# Patient Record
Sex: Male | Born: 1975 | Race: Black or African American | Hispanic: No | Marital: Single | Smoking: Never smoker
Health system: Southern US, Community
[De-identification: ages and names within clinical notes are randomized; demographics above are authoritative.]

## PROBLEM LIST (undated history)

## (undated) DIAGNOSIS — R569 Unspecified convulsions: Secondary | ICD-10-CM

## (undated) DIAGNOSIS — S069XAA Unspecified intracranial injury with loss of consciousness status unknown, initial encounter: Secondary | ICD-10-CM

## (undated) DIAGNOSIS — S069X9A Unspecified intracranial injury with loss of consciousness of unspecified duration, initial encounter: Secondary | ICD-10-CM

## (undated) HISTORY — PX: CRANIOTOMY: SHX93

## (undated) HISTORY — PX: CRANIOPLASTY: SHX1407

---

## 2017-08-04 ENCOUNTER — Encounter (HOSPITAL_COMMUNITY): Payer: Self-pay | Admitting: Emergency Medicine

## 2017-08-04 ENCOUNTER — Emergency Department (HOSPITAL_COMMUNITY)
Admission: EM | Admit: 2017-08-04 | Discharge: 2017-08-05 | Disposition: A | Discharge status: Skilled Nursing Facility | Payer: Medicaid Other | Attending: Emergency Medicine | Admitting: Emergency Medicine

## 2017-08-04 DIAGNOSIS — Z79899 Other long term (current) drug therapy: Secondary | ICD-10-CM | POA: Insufficient documentation

## 2017-08-04 DIAGNOSIS — R21 Rash and other nonspecific skin eruption: Secondary | ICD-10-CM | POA: Diagnosis not present

## 2017-08-04 HISTORY — DX: Unspecified intracranial injury with loss of consciousness of unspecified duration, initial encounter: S06.9X9A

## 2017-08-04 HISTORY — DX: Unspecified intracranial injury with loss of consciousness status unknown, initial encounter: S06.9XAA

## 2017-08-04 MED ORDER — TALC EX POWD: CUTANEOUS | 0 refills | Status: DC | PRN

## 2017-08-04 MED ORDER — CLOTRIMAZOLE 1 % EX CREA: TOPICAL_CREAM | CUTANEOUS | 0 refills | Status: AC

## 2017-08-04 NOTE — ED Notes (Signed)
Called [facility] to pick up patient.

## 2017-08-04 NOTE — ED Triage Notes (Signed)
Patient arrives by EMS with complaints of an insect bite or some sort of rash to right axilla. Patient has a TBI with speech impairment and from [place]

## 2017-08-04 NOTE — ED Triage Notes (Signed)
Patient has red pussy bumps under right armpit. [facility] ([place]). Patient called EMS.

## 2017-08-04 NOTE — ED Provider Notes (Signed)
[facility] DEPT Provider Note   CSN: [id number] Arrival date & time: 08/04/17  2149     History   Chief Complaint Chief Complaint  Patient presents with  . Insect Bite    skin irriation right arm     HPI Didier Brandenburg is a 41 y.o. male.  Patient presents to the emergency department with chief complaint of rash on her right axilla.  He reports that the symptoms started several days ago.  He reports that the rash is itchy.  He denies any fevers or chills.  Denies having tried any treatments for it.  Denies any other associated symptoms.  There are no modifying factors.   The history is provided by the patient. No language interpreter was used.    Past Medical History:  Diagnosis Date  . TBI (traumatic brain injury) ([place])     There are no active problems to display for this patient.   Past Surgical History:  Procedure Laterality Date  . CRANIOPLASTY    . CRANIOTOMY         Home Medications    Prior to Admission medications   Medication Sig Start Date End Date Taking? Authorizing Provider  atorvastatin (LIPITOR) 40 MG tablet Take 40 mg by mouth at bedtime.   Yes [provider]  docusate sodium (COLACE) 100 MG capsule Take 100 mg by mouth 2 (two) times daily.   Yes [provider]  ferrous sulfate 325 (65 FE) MG tablet Take 325 mg by mouth 2 (two) times daily.   Yes [provider]  levETIRAcetam (KEPPRA) 1000 MG tablet Take 1,000 mg by mouth 2 (two) times daily.   Yes [provider]  metoprolol tartrate (LOPRESSOR) 25 MG tablet Take 12.5 mg by mouth 2 (two) times daily.   Yes [provider]  polyethylene glycol (MIRALAX / GLYCOLAX) packet Take 17 g by mouth daily.   Yes [provider]  QUEtiapine (SEROQUEL) 25 MG tablet Take 25 mg by mouth 2 (two) times daily.   Yes [provider]    Family History History reviewed. No pertinent family history.  Social  History Social History  Substance Use Topics  . Smoking status: Never Smoker  . Smokeless tobacco: Never Used  . Alcohol use No     Allergies   Patient has no known allergies.   Review of Systems Review of Systems  All other systems reviewed and are negative.    Physical Exam Updated Vital Signs BP (!) 145/90 (BP Location: Right Arm)   Pulse (!) 57   Temp 97.6 F (36.4 C) (Oral)   Resp 18   Ht 5' 10"  (1.778 m)   Wt 72.6 kg (160 lb)   SpO2 100%   BMI 22.96 kg/m   Physical Exam  Constitutional: He is oriented to person, place, and time. He appears well-developed and well-nourished.  HENT:  Head: Normocephalic and atraumatic.  Eyes: Conjunctivae and EOM are normal.  Neck: Normal range of motion.  Cardiovascular: Normal rate.   Pulmonary/Chest: Effort normal.  Abdominal: He exhibits no distension.  Musculoskeletal: Normal range of motion.  Neurological: He is alert and oriented to person, place, and time.  Skin: Skin is dry.  Candidal appearing rash in right axilla, no abscess or cellulitis  Psychiatric: He has a normal mood and affect. His behavior is normal. Judgment and thought content normal.  Nursing note and vitals reviewed.    ED Treatments / Results  Labs (all labs ordered are listed,  but only abnormal results are displayed) Labs Reviewed - No data to display  EKG  EKG Interpretation None       Radiology No results found.  Procedures Procedures (including critical care time)  Medications Ordered in ED Medications - No data to display   Initial Impression / Assessment and Plan / ED Course  I have reviewed the triage vital signs and the nursing notes.  Pertinent labs & imaging results that were available during my care of the patient were reviewed by me and considered in my medical decision making (see chart for details).     Patient with candidal appearing rash to right axilla.  Will treat with clotrimazole cream.  Instructed patient  to keep the area dry.  Return precautions discussed.  Final Clinical Impressions(s) / ED Diagnoses   Final diagnoses:  Rash    New Prescriptions New Prescriptions   No medications on file     Jandel, Patriarca, Hershal Coria 08/04/17 2310    Molpus, Jenny Reichmann, MD 08/05/17 [phone]

## 2017-08-04 NOTE — Discharge Instructions (Signed)
Please apply the cream in the morning and in the evening.  1 hour after applying the cream, please apply the baby powder to keep the area dry.

## 2017-12-19 ENCOUNTER — Encounter (HOSPITAL_COMMUNITY): Payer: Self-pay

## 2017-12-19 ENCOUNTER — Emergency Department (HOSPITAL_COMMUNITY)
Admission: EM | Admit: 2017-12-19 | Discharge: 2017-12-19 | Disposition: A | Discharge status: Home / Self Care | Payer: Medicaid Other | Attending: Emergency Medicine | Admitting: Emergency Medicine

## 2017-12-19 DIAGNOSIS — R569 Unspecified convulsions: Secondary | ICD-10-CM | POA: Diagnosis not present

## 2017-12-19 DIAGNOSIS — Z87820 Personal history of traumatic brain injury: Secondary | ICD-10-CM | POA: Insufficient documentation

## 2017-12-19 DIAGNOSIS — R030 Elevated blood-pressure reading, without diagnosis of hypertension: Secondary | ICD-10-CM | POA: Insufficient documentation

## 2017-12-19 DIAGNOSIS — Z79899 Other long term (current) drug therapy: Secondary | ICD-10-CM | POA: Insufficient documentation

## 2017-12-19 MED ORDER — LORAZEPAM 2 MG/ML IJ SOLN
INTRAMUSCULAR | Status: AC
  Administered 2017-12-19: 2 mg
  Filled 2017-12-19: qty 1

## 2017-12-19 MED ORDER — LEVETIRACETAM IN NACL 1000 MG/100ML IV SOLN
1000.0000 mg | Freq: Once | INTRAVENOUS | Status: AC
  Administered 2017-12-19: 1000 mg via INTRAVENOUS
  Filled 2017-12-19 (×2): qty 100

## 2017-12-19 NOTE — ED Notes (Signed)
Pt takes KEPPRA twice a day

## 2017-12-19 NOTE — ED Provider Notes (Signed)
[facility] DEPT Provider Note   CSN: [id number] Arrival date & time: 12/19/17  1779     History   Chief Complaint Chief Complaint  Patient presents with  . Seizures    HPI Travis Briggs is a 42 y.o. male.  The history is provided by the nursing home. The history is limited by the condition of the patient (Postictal state).  He has history of traumatic brain injury and is on levetiracetam twice a day.  He is reported to have had a seizure at the facility where he resides and was transported here by ambulance.  He had another seizure shortly after arrival and was given a dose of lorazepam.  There was urinary incontinence but no bit lip or tongue.  He is not able to give any history at this point.  Past Medical History:  Diagnosis Date  . TBI (traumatic brain injury) ([place])     There are no active problems to display for this patient.   Past Surgical History:  Procedure Laterality Date  . CRANIOPLASTY    . CRANIOTOMY         Home Medications    Prior to Admission medications   Medication Sig Start Date End Date Taking? Authorizing Provider  atorvastatin (LIPITOR) 40 MG tablet Take 40 mg by mouth at bedtime.    [provider]  clotrimazole (LOTRIMIN) 1 % cream Apply to affected area 2 times daily 08/04/17   Montine Circle, PA-C  docusate sodium (COLACE) 100 MG capsule Take 100 mg by mouth 2 (two) times daily.    [provider]  ferrous sulfate 325 (65 FE) MG tablet Take 325 mg by mouth 2 (two) times daily.    [provider]  levETIRAcetam (KEPPRA) 1000 MG tablet Take 1,000 mg by mouth 2 (two) times daily.    [provider]  metoprolol tartrate (LOPRESSOR) 25 MG tablet Take 12.5 mg by mouth 2 (two) times daily.    [provider]  polyethylene glycol (MIRALAX / GLYCOLAX) packet Take 17 g by mouth daily.    [provider]  QUEtiapine (SEROQUEL) 25 MG tablet Take 25 mg by mouth 2 (two)  times daily.    [provider]  talc powder Apply topically as needed. 08/04/17   Montine Circle, PA-C    Family History History reviewed. No pertinent family history.  Social History Social History   Tobacco Use  . Smoking status: Never Smoker  . Smokeless tobacco: Never Used  Substance Use Topics  . Alcohol use: No  . Drug use: No     Allergies   Patient has no known allergies.   Review of Systems Review of Systems  Unable to perform ROS: Mental status change     Physical Exam Updated Vital Signs BP (!) 157/124 (BP Location: Left Arm)   Pulse 88   Resp 18   SpO2 96%   Physical Exam  Nursing note and vitals reviewed.  42 year old male, resting comfortably and in no acute distress. Vital signs are significant for elevated blood pressure. Oxygen saturation is 96%, which is normal. Head is normocephalic and atraumatic. PERRLA, EOMI. Oropharynx is clear. Neck is nontender and supple without adenopathy or JVD. Back is nontender and there is no CVA tenderness. Lungs are clear without rales, wheezes, or rhonchi. Chest is nontender. Heart has regular rate and rhythm without murmur. Abdomen is soft, flat, nontender without masses or hepatosplenomegaly and peristalsis is normoactive. Extremities have no cyanosis or edema,  full range of motion is present. Skin is warm and dry without rash. Neurologic: He is awake but poorly responsive to his surroundings.  There are no gross motor or sensory deficits.  ED Treatments / Results  Labs (all labs ordered are listed, but only abnormal results are displayed) Labs Reviewed - No data to display  EKG  EKG Interpretation None       Radiology No results found.  Procedures Procedures (including critical care time)  Medications Ordered in ED Medications  levETIRAcetam (KEPPRA) IVPB 1000 mg/100 mL premix (not administered)  LORazepam (ATIVAN) 2 MG/ML injection (2 mg  Given 12/19/17 0940)     Initial  Impression / Assessment and Plan / ED Course  I have reviewed the triage vital signs and the nursing notes.  Pertinent labs & imaging results that were available during my care of the patient were reviewed by me and considered in my medical decision making (see chart for details).  Seizure in patient on anticonvulsants, presumably history of seizure disorder.  Rather limited information is sent with him from the facility where he resides.  At this point, no indication for CT scan or laboratory workup.  He is given a dose of levetiracetam intravenously.  Patient is reevaluated and he is waking up although still not well oriented.  He will be observed in the ED and discharged once he has returned to baseline.  Case is signed out to Dr. Dallas Breeding.  Final Clinical Impressions(s) / ED Diagnoses   Final diagnoses:  Seizure [facility])    ED Discharge Orders    None       Delora Fuel, MD 76/80/88 [phone]

## 2017-12-19 NOTE — ED Notes (Signed)
Pt had a seizure this am at his facility, staff states that it lasted 6 minutes, pt didn't hit his head and was assisted to the floor Pt has a TBI

## 2017-12-19 NOTE — ED Provider Notes (Signed)
I assumed care of this patient from Dr. Roxanne Mins at 9147.  Please see their note for further details of Hx, PE.  Briefly patient is a 42 y.o. male with a history of TBI resulting in seizures who presented with seizure episode.  Patient is currently still postictal..   Current plan is to monitor the patient for recovery.  Once patient is baseline he should be safe for discharge.  11:18 AM Patient is awake and alert.  Appears to be at baseline mental status.  Able to tolerate oral intake.  The patient is safe for discharge with strict return precautions.  Disposition: Discharge  Condition: Good  I have discussed the results, Dx and Tx plan with the patient who expressed understanding and agree(s) with the plan. Discharge instructions discussed at great length. The patient was given strict return precautions who verbalized understanding of the instructions. No further questions at time of discharge.    ED Discharge Orders    None       Follow Up: Neurologist  Call  As needed        Fatima Blank, MD 12/19/17 1118
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
## 2017-12-19 NOTE — ED Notes (Signed)
Pt began having a seizure as he got on the stretcher from EMS, pt given 67m ativen

## 2017-12-19 NOTE — ED Notes (Signed)
Unable to get temperature due to patient seizing

## 2017-12-19 NOTE — ED Notes (Signed)
[facility] contacted to transport to facility.

## 2017-12-19 NOTE — ED Notes (Signed)
Bed: CV01 Expected date:  Expected time:  Means of arrival:  Comments: 42 yr old seizure/TBI

## 2017-12-19 NOTE — ED Notes (Signed)
Unable to obtain signature before leaving with [facility] due to patient's significant past medical history (TBI).

## 2017-12-19 NOTE — ED Notes (Signed)
Pt sitting on edge of bed; IV pulled out. Bedside cleaned and pt reoriented.

## 2018-06-09 ENCOUNTER — Emergency Department (HOSPITAL_COMMUNITY)
Admission: EM | Admit: 2018-06-09 | Discharge: 2018-06-10 | Disposition: A | Discharge status: Home / Self Care | Payer: Medicaid Other | Attending: Emergency Medicine | Admitting: Emergency Medicine

## 2018-06-09 ENCOUNTER — Encounter (HOSPITAL_COMMUNITY): Payer: Self-pay

## 2018-06-09 ENCOUNTER — Other Ambulatory Visit: Payer: Self-pay

## 2018-06-09 DIAGNOSIS — Z87820 Personal history of traumatic brain injury: Secondary | ICD-10-CM | POA: Insufficient documentation

## 2018-06-09 DIAGNOSIS — Z79899 Other long term (current) drug therapy: Secondary | ICD-10-CM | POA: Diagnosis not present

## 2018-06-09 DIAGNOSIS — F209 Schizophrenia, unspecified: Secondary | ICD-10-CM | POA: Diagnosis not present

## 2018-06-09 DIAGNOSIS — Z046 Encounter for general psychiatric examination, requested by authority: Secondary | ICD-10-CM

## 2018-06-09 LAB — COMPREHENSIVE METABOLIC PANEL
ALT: 43 U/L (ref 0–44)
AST: 26 U/L (ref 15–41)
Albumin: 5 g/dL (ref 3.5–5.0)
Alkaline Phosphatase: 89 U/L (ref 38–126)
Anion gap: 9 (ref 5–15)
BILIRUBIN TOTAL: 0.5 mg/dL (ref 0.3–1.2)
BUN: 15 mg/dL (ref 6–20)
CO2: 25 mmol/L (ref 22–32)
Calcium: 10 mg/dL (ref 8.9–10.3)
Chloride: 107 mmol/L (ref 98–111)
Creatinine, Ser: 1.08 mg/dL (ref 0.61–1.24)
Glucose, Bld: 107 mg/dL — ABNORMAL HIGH (ref 70–99)
POTASSIUM: 4.3 mmol/L (ref 3.5–5.1)
Sodium: 141 mmol/L (ref 135–145)
TOTAL PROTEIN: 8.4 g/dL — AB (ref 6.5–8.1)

## 2018-06-09 LAB — CBC
HCT: 42.7 % (ref 39.0–52.0)
Hemoglobin: 14.3 g/dL (ref 13.0–17.0)
MCH: 30.6 pg (ref 26.0–34.0)
MCHC: 33.5 g/dL (ref 30.0–36.0)
MCV: 91.2 fL (ref 78.0–100.0)
PLATELETS: 226 10*3/uL (ref 150–400)
RBC: 4.68 MIL/uL (ref 4.22–5.81)
RDW: 13.5 % (ref 11.5–15.5)
WBC: 5.6 10*3/uL (ref 4.0–10.5)

## 2018-06-09 LAB — ETHANOL

## 2018-06-09 LAB — SALICYLATE LEVEL: Salicylate Lvl: <7 mg/dL (ref 2.8–30.0)

## 2018-06-09 LAB — ACETAMINOPHEN LEVEL: Acetaminophen (Tylenol), Serum: <10 ug/mL — ABNORMAL LOW (ref 10–30)

## 2018-06-09 MED ORDER — DIPHENHYDRAMINE HCL 50 MG/ML IJ SOLN: 50.0000 mg | Freq: Four times a day (QID) | INTRAMUSCULAR | Status: DC | PRN

## 2018-06-09 MED ORDER — HALOPERIDOL 5 MG PO TABS: 5.0000 mg | ORAL_TABLET | Freq: Four times a day (QID) | ORAL | Status: DC | PRN

## 2018-06-09 MED ORDER — LORAZEPAM 2 MG/ML IJ SOLN: 2.0000 mg | Freq: Four times a day (QID) | INTRAMUSCULAR | Status: DC | PRN

## 2018-06-09 MED ORDER — LORAZEPAM 1 MG PO TABS: 2.0000 mg | ORAL_TABLET | Freq: Four times a day (QID) | ORAL | Status: DC | PRN

## 2018-06-09 MED ORDER — HYDROXYZINE HCL 25 MG PO TABS: 50.0000 mg | ORAL_TABLET | Freq: Four times a day (QID) | ORAL | Status: DC | PRN

## 2018-06-09 MED ORDER — HALOPERIDOL LACTATE 5 MG/ML IJ SOLN: 5.0000 mg | Freq: Four times a day (QID) | INTRAMUSCULAR | Status: DC | PRN

## 2018-06-09 NOTE — ED Notes (Signed)
Bed: [facility] Expected date:  Expected time:  Means of arrival:  Comments: Triage 4
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
## 2018-06-09 NOTE — ED Notes (Signed)
Green polo shirt, blue under ware, beige cargo pants, black socks, $1.51 in plastic bag, purple lighter in plastic bag, brown loafers

## 2018-06-09 NOTE — ED Notes (Signed)
Bed: [facility] Expected date:  Expected time:  Means of arrival:  Comments: 

## 2018-06-09 NOTE — Progress Notes (Signed)
IVC paperwork given to [facility] to be rescinded.  [facility] contacted with no response to pick up patient.

## 2018-06-09 NOTE — BH Assessment (Signed)
Attempted to contact [facility]-[phone].  There was not an answer and no voice mail
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
## 2018-06-09 NOTE — ED Notes (Deleted)
Spoke to [place] at [facility] intake [phone].  Confirmed that [facility] will hold bed until tomorrow for patient.
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
## 2018-06-10 ENCOUNTER — Emergency Department (HOSPITAL_COMMUNITY)
Admission: EM | Admit: 2018-06-10 | Discharge: 2018-06-10 | Disposition: A | Discharge status: Home / Self Care | Payer: Medicaid Other | Source: Home / Self Care | Attending: Emergency Medicine | Admitting: Emergency Medicine

## 2018-06-10 ENCOUNTER — Emergency Department (HOSPITAL_COMMUNITY): Payer: Medicaid Other

## 2018-06-10 ENCOUNTER — Other Ambulatory Visit: Payer: Self-pay

## 2018-06-10 ENCOUNTER — Encounter (HOSPITAL_COMMUNITY): Payer: Self-pay | Admitting: *Deleted

## 2018-06-10 DIAGNOSIS — R569 Unspecified convulsions: Secondary | ICD-10-CM

## 2018-06-10 HISTORY — DX: Unspecified convulsions: R56.9

## 2018-06-10 LAB — CBC WITH DIFFERENTIAL/PLATELET
BASOS ABS: 0 10*3/uL (ref 0.0–0.1)
BASOS PCT: 0 %
Eosinophils Absolute: 0.3 10*3/uL (ref 0.0–0.7)
Eosinophils Relative: 7 %
HCT: 45.6 % (ref 39.0–52.0)
HEMOGLOBIN: 15 g/dL (ref 13.0–17.0)
LYMPHS PCT: 35 %
Lymphs Abs: 1.8 10*3/uL (ref 0.7–4.0)
MCH: 30.2 pg (ref 26.0–34.0)
MCHC: 32.9 g/dL (ref 30.0–36.0)
MCV: 91.9 fL (ref 78.0–100.0)
MONOS PCT: 11 %
Monocytes Absolute: 0.6 10*3/uL (ref 0.1–1.0)
NEUTROS ABS: 2.4 10*3/uL (ref 1.7–7.7)
NEUTROS PCT: 47 %
Platelets: 213 10*3/uL (ref 150–400)
RBC: 4.96 MIL/uL (ref 4.22–5.81)
RDW: 13.5 % (ref 11.5–15.5)
WBC: 5.1 10*3/uL (ref 4.0–10.5)

## 2018-06-10 LAB — COMPREHENSIVE METABOLIC PANEL
ALBUMIN: 4.7 g/dL (ref 3.5–5.0)
ALK PHOS: 81 U/L (ref 38–126)
ALT: 39 U/L (ref 0–44)
AST: 31 U/L (ref 15–41)
Anion gap: 11 (ref 5–15)
BUN: 12 mg/dL (ref 6–20)
CALCIUM: 9.8 mg/dL (ref 8.9–10.3)
CO2: 24 mmol/L (ref 22–32)
CREATININE: 1.02 mg/dL (ref 0.61–1.24)
Chloride: 106 mmol/L (ref 98–111)
GFR calc Af Amer: >60 mL/min (ref >60)
GFR calc non Af Amer: >60 mL/min (ref >60)
GLUCOSE: 111 mg/dL — AB (ref 70–99)
Potassium: 4.7 mmol/L (ref 3.5–5.1)
Sodium: 141 mmol/L (ref 135–145)
TOTAL PROTEIN: 8.2 g/dL — AB (ref 6.5–8.1)
Total Bilirubin: 0.5 mg/dL (ref 0.3–1.2)

## 2018-06-10 LAB — RAPID URINE DRUG SCREEN, HOSP PERFORMED
AMPHETAMINES: NOT DETECTED
Barbiturates: NOT DETECTED
Benzodiazepines: POSITIVE — AB
Cocaine: NOT DETECTED
Opiates: NOT DETECTED
TETRAHYDROCANNABINOL: NOT DETECTED

## 2018-06-10 LAB — ETHANOL: Alcohol, Ethyl (B): <10 mg/dL (ref <10)

## 2018-06-10 MED ORDER — PHENYTOIN SODIUM EXTENDED 100 MG PO CAPS
400.0000 mg | ORAL_CAPSULE | Freq: Once | ORAL | Status: AC
  Administered 2018-06-10: 400 mg via ORAL
  Filled 2018-06-10: qty 4

## 2018-06-10 MED ORDER — SODIUM CHLORIDE 0.9 % IV SOLN
INTRAVENOUS | Status: DC
  Administered 2018-06-10: 500 mL/h via INTRAVENOUS

## 2018-06-10 MED ORDER — LEVETIRACETAM IN NACL 1000 MG/100ML IV SOLN
1000.0000 mg | Freq: Once | INTRAVENOUS | Status: AC
  Administered 2018-06-10: 1000 mg via INTRAVENOUS
  Filled 2018-06-10: qty 100

## 2018-06-10 MED ORDER — PHENYTOIN SODIUM EXTENDED 100 MG PO CAPS: 100.0000 mg | ORAL_CAPSULE | Freq: Three times a day (TID) | ORAL | 0 refills | Status: AC

## 2018-06-10 NOTE — Discharge Instructions (Addendum)
Please follow-up with your neurologist to have your seizure medications adjusted.  You were offered admission for further monitoring of your right-sided shaking which could be from seizures which you have refused at this time.  He was started on a new antiseizure medication which you are to take in conjunction with your Keppra.  Do not take your disulfiram while on this medication.

## 2018-06-10 NOTE — ED Notes (Signed)
Pt refused to stay as Dr Zenia Resides explains the importance and need to stay for his health and to be started on new meds.

## 2018-06-10 NOTE — ED Notes (Signed)
Spoke with Gloriann Loan at [facility] who states they do not have transport to come pick pt up. She is aware he does not meet criteria for [facility]. Due to event no other means of transportation available [facility] called to come transport pt.

## 2018-06-10 NOTE — ED Notes (Signed)
Kasandra at facility called back regarding pt, she has been updated that pt will be returning and has had a loading does of Dilantin as well as returning with a prescription.

## 2018-06-10 NOTE — ED Triage Notes (Signed)
EMS reports pt is from [facility] on [address] after having a 12 mininute rt arm localized seizure. Stroke screen negative, en route pt had about a 2 min rt arm seizure and Versed 6m given IM. Pt sleepy upon arrival.
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
## 2018-06-10 NOTE — ED Notes (Signed)
Attempting to call facility due to pt having trembling motion in rt hand to try and identify if this a new event that started today or has been going on. Continue to attempt to call facility.

## 2018-06-10 NOTE — ED Notes (Signed)
Sandwich and soda provided at pts request.

## 2018-06-10 NOTE — Progress Notes (Signed)
Pt discharged.  [facility] contacted.  Supervisor Smolan at [phone] contacted.  Sts they will not send transportation for patient. Pt denies pain or discomfort at this time. Pt denies SI, HI and AVH.

## 2018-06-10 NOTE — ED Notes (Signed)
Bed: WG66 Expected date:  Expected time:  Means of arrival:  Comments: 41 yo seizure

## 2018-06-10 NOTE — ED Notes (Signed)
Pt states he does not want to stay at hospital. Pt able to use hand at present time. Dr Zenia Resides speaking with Neurologist at present time

## 2018-06-10 NOTE — ED Notes (Signed)
Spoke with Bailey Mech at facility [phone] who verified pt has been taking his medication.

## 2018-06-10 NOTE — ED Notes (Signed)
Pt becoming agitated, trying to leave, at present not stable walking without someone with him. Pt has been reminded multiple times we are waiting for transport to come take him to facilty.

## 2018-06-13 ENCOUNTER — Emergency Department (HOSPITAL_COMMUNITY): Payer: Medicaid Other

## 2018-06-13 ENCOUNTER — Other Ambulatory Visit: Payer: Self-pay

## 2018-06-13 ENCOUNTER — Emergency Department (HOSPITAL_COMMUNITY)
Admission: EM | Admit: 2018-06-13 | Discharge: 2018-06-13 | Disposition: A | Discharge status: Home / Self Care | Payer: Medicaid Other | Attending: Emergency Medicine | Admitting: Emergency Medicine

## 2018-06-13 DIAGNOSIS — Z79899 Other long term (current) drug therapy: Secondary | ICD-10-CM | POA: Insufficient documentation

## 2018-06-13 DIAGNOSIS — Y929 Unspecified place or not applicable: Secondary | ICD-10-CM | POA: Insufficient documentation

## 2018-06-13 DIAGNOSIS — Y999 Unspecified external cause status: Secondary | ICD-10-CM | POA: Diagnosis not present

## 2018-06-13 DIAGNOSIS — G8911 Acute pain due to trauma: Secondary | ICD-10-CM | POA: Diagnosis present

## 2018-06-13 DIAGNOSIS — M25511 Pain in right shoulder: Secondary | ICD-10-CM | POA: Insufficient documentation

## 2018-06-13 DIAGNOSIS — Z87820 Personal history of traumatic brain injury: Secondary | ICD-10-CM | POA: Diagnosis not present

## 2018-06-13 DIAGNOSIS — Y939 Activity, unspecified: Secondary | ICD-10-CM | POA: Diagnosis not present

## 2018-06-13 DIAGNOSIS — S60221A Contusion of right hand, initial encounter: Secondary | ICD-10-CM | POA: Diagnosis not present
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
## 2018-06-13 NOTE — Discharge Instructions (Addendum)
Your x-rays show no broken bones or dislocation. Follow up with your primary care doctor. Return here as needed.
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
## 2018-07-16 ENCOUNTER — Ambulatory Visit (INDEPENDENT_AMBULATORY_CARE_PROVIDER_SITE_OTHER): Payer: Medicaid Other | Admitting: Podiatry

## 2018-07-16 DIAGNOSIS — B353 Tinea pedis: Secondary | ICD-10-CM

## 2018-07-16 MED ORDER — CLOTRIMAZOLE-BETAMETHASONE 1-0.05 % EX CREA: 1.0000 "application " | TOPICAL_CREAM | Freq: Two times a day (BID) | CUTANEOUS | 1 refills | Status: AC

## 2018-07-17 NOTE — Progress Notes (Signed)
   HPI: 42 year old male presenting today as a new patient with a chief complaint of possible tinea pedis between the toes and plantar aspect of the right foot that has been present for the past two months. He reports associated itching. He has not done anything for treatment. There are no modifying factors noted. Patient is here for further evaluation and treatment.   Past Medical History:  Diagnosis Date  . Seizures ([place])   . TBI (traumatic brain injury) [facility])      Physical Exam: General: The patient is alert and oriented x3 in no acute distress.  Dermatology: Pruritus to the interdigital areas and plantar aspect of the right foot with hyperkeratosis. Skin is warm, dry and supple bilateral lower extremities. Negative for open lesions or macerations.  Vascular: Palpable pedal pulses bilaterally. No edema or erythema noted. Capillary refill within normal limits.  Neurological: Epicritic and protective threshold grossly intact bilaterally.   Musculoskeletal Exam: Range of motion within normal limits to all pedal and ankle joints bilateral. Muscle strength 5/5 in all groups bilateral.   Assessment: 1. Tinea pedis right foot    Plan of Care:  1. Patient evaluated.   2. Prescription for Lotrisone cream provided to patient for daily use for four weeks.  3. Return to clinic as needed.      Edrick Kins, DPM [facility]  Dr. Edrick Kins, DPM    [address], [place] [address]                Office [phone]  Fax [phone]

## 2018-08-04 ENCOUNTER — Encounter (HOSPITAL_COMMUNITY): Payer: Self-pay | Admitting: Emergency Medicine

## 2018-08-04 ENCOUNTER — Emergency Department (HOSPITAL_COMMUNITY): Payer: Medicaid Other

## 2018-08-04 ENCOUNTER — Emergency Department (HOSPITAL_COMMUNITY)
Admission: EM | Admit: 2018-08-04 | Discharge: 2018-08-05 | Disposition: A | Discharge status: Home / Self Care | Payer: Medicaid Other | Attending: Emergency Medicine | Admitting: Emergency Medicine

## 2018-08-04 ENCOUNTER — Other Ambulatory Visit: Payer: Self-pay

## 2018-08-04 DIAGNOSIS — Z79899 Other long term (current) drug therapy: Secondary | ICD-10-CM | POA: Diagnosis not present

## 2018-08-04 DIAGNOSIS — M25511 Pain in right shoulder: Secondary | ICD-10-CM | POA: Diagnosis not present

## 2018-08-04 NOTE — Discharge Instructions (Addendum)
Take ibuprofen for your pain. Follow up with your doctor for further evaluation.
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
## 2018-08-04 NOTE — ED Notes (Signed)
Bed: [facility] Expected date:  Expected time:  Means of arrival:  Comments: 44 M from [facility]- ETOH and shoulder pain

## 2018-08-05 NOTE — ED Notes (Signed)
Pt laying in bed.
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
## 2018-08-05 NOTE — ED Notes (Signed)
Pt ambulating to restroom without needing assistance.

## 2018-08-05 NOTE — ED Notes (Signed)
Pt continues to ask every [facility] medic to take him home. Explained to patient multiple times that [facility] is a different service.

## 2019-02-03 ENCOUNTER — Emergency Department (HOSPITAL_COMMUNITY): Payer: Medicaid Other

## 2019-02-03 ENCOUNTER — Inpatient Hospital Stay (HOSPITAL_COMMUNITY)
Admission: EM | Admit: 2019-02-03 | Discharge: 2019-02-22 | DRG: 101 | Disposition: A | Discharge status: Skilled Nursing Facility | Payer: Medicaid Other | Attending: Internal Medicine | Admitting: Internal Medicine

## 2019-02-03 ENCOUNTER — Other Ambulatory Visit: Payer: Self-pay

## 2019-02-03 DIAGNOSIS — R066 Hiccough: Secondary | ICD-10-CM | POA: Diagnosis not present

## 2019-02-03 DIAGNOSIS — R4701 Aphasia: Secondary | ICD-10-CM | POA: Diagnosis not present

## 2019-02-03 DIAGNOSIS — R569 Unspecified convulsions: Secondary | ICD-10-CM

## 2019-02-03 DIAGNOSIS — K048 Radicular cyst: Secondary | ICD-10-CM | POA: Diagnosis present

## 2019-02-03 DIAGNOSIS — R112 Nausea with vomiting, unspecified: Secondary | ICD-10-CM

## 2019-02-03 DIAGNOSIS — Z79899 Other long term (current) drug therapy: Secondary | ICD-10-CM

## 2019-02-03 DIAGNOSIS — R404 Transient alteration of awareness: Secondary | ICD-10-CM

## 2019-02-03 DIAGNOSIS — G9340 Encephalopathy, unspecified: Secondary | ICD-10-CM | POA: Diagnosis present

## 2019-02-03 DIAGNOSIS — G9389 Other specified disorders of brain: Secondary | ICD-10-CM | POA: Diagnosis present

## 2019-02-03 DIAGNOSIS — Z20828 Contact with and (suspected) exposure to other viral communicable diseases: Secondary | ICD-10-CM | POA: Diagnosis present

## 2019-02-03 DIAGNOSIS — Z87820 Personal history of traumatic brain injury: Secondary | ICD-10-CM

## 2019-02-03 DIAGNOSIS — T1490XA Injury, unspecified, initial encounter: Secondary | ICD-10-CM

## 2019-02-03 DIAGNOSIS — D696 Thrombocytopenia, unspecified: Secondary | ICD-10-CM | POA: Diagnosis present

## 2019-02-03 DIAGNOSIS — G40901 Epilepsy, unspecified, not intractable, with status epilepticus: Secondary | ICD-10-CM

## 2019-02-03 DIAGNOSIS — G40411 Other generalized epilepsy and epileptic syndromes, intractable, with status epilepticus: Principal | ICD-10-CM | POA: Diagnosis present

## 2019-02-03 DIAGNOSIS — G8384 Todd's paralysis (postepileptic): Secondary | ICD-10-CM | POA: Diagnosis present

## 2019-02-03 DIAGNOSIS — N390 Urinary tract infection, site not specified: Secondary | ICD-10-CM | POA: Diagnosis not present

## 2019-02-03 DIAGNOSIS — R7881 Bacteremia: Secondary | ICD-10-CM | POA: Diagnosis not present

## 2019-02-03 DIAGNOSIS — R9401 Abnormal electroencephalogram [EEG]: Secondary | ICD-10-CM | POA: Diagnosis present

## 2019-02-03 DIAGNOSIS — M2740 Unspecified cyst of jaw: Secondary | ICD-10-CM | POA: Diagnosis present

## 2019-02-03 DIAGNOSIS — I639 Cerebral infarction, unspecified: Secondary | ICD-10-CM

## 2019-02-03 DIAGNOSIS — R531 Weakness: Secondary | ICD-10-CM

## 2019-02-03 DIAGNOSIS — B965 Pseudomonas (aeruginosa) (mallei) (pseudomallei) as the cause of diseases classified elsewhere: Secondary | ICD-10-CM | POA: Diagnosis not present

## 2019-02-03 DIAGNOSIS — R0989 Other specified symptoms and signs involving the circulatory and respiratory systems: Secondary | ICD-10-CM

## 2019-02-03 DIAGNOSIS — G8191 Hemiplegia, unspecified affecting right dominant side: Secondary | ICD-10-CM | POA: Diagnosis present

## 2019-02-03 LAB — CBC
HCT: 42.4 % (ref 39.0–52.0)
Hemoglobin: 13.7 g/dL (ref 13.0–17.0)
MCH: 31.1 pg (ref 26.0–34.0)
MCHC: 32.3 g/dL (ref 30.0–36.0)
MCV: 96.4 fL (ref 80.0–100.0)
Platelets: 201 10*3/uL (ref 150–400)
RBC: 4.4 MIL/uL (ref 4.22–5.81)
RDW: 12.5 % (ref 11.5–15.5)
WBC: 6.5 10*3/uL (ref 4.0–10.5)
nRBC: 0 % (ref 0.0–0.2)

## 2019-02-03 LAB — DIFFERENTIAL
Abs Immature Granulocytes: 0.02 10*3/uL (ref 0.00–0.07)
Basophils Absolute: 0 10*3/uL (ref 0.0–0.1)
Basophils Relative: 1 %
Eosinophils Absolute: 0.1 10*3/uL (ref 0.0–0.5)
Eosinophils Relative: 1 %
Immature Granulocytes: 0 %
Lymphocytes Relative: 22 %
Lymphs Abs: 1.4 10*3/uL (ref 0.7–4.0)
Monocytes Absolute: 0.5 10*3/uL (ref 0.1–1.0)
Monocytes Relative: 8 %
Neutro Abs: 4.5 10*3/uL (ref 1.7–7.7)
Neutrophils Relative %: 68 %

## 2019-02-03 LAB — COMPREHENSIVE METABOLIC PANEL
ALT: 31 U/L (ref 0–44)
AST: 22 U/L (ref 15–41)
Albumin: 4.9 g/dL (ref 3.5–5.0)
Alkaline Phosphatase: 89 U/L (ref 38–126)
Anion gap: 9 (ref 5–15)
BUN: 15 mg/dL (ref 8–23)
CO2: 25 mmol/L (ref 22–32)
Calcium: 9.4 mg/dL (ref 8.9–10.3)
Chloride: 106 mmol/L (ref 98–111)
Creatinine, Ser: 0.94 mg/dL (ref 0.61–1.24)
GFR calc Af Amer: 57 mL/min — ABNORMAL LOW (ref >60)
GFR calc non Af Amer: 49 mL/min — ABNORMAL LOW (ref >60)
Glucose, Bld: 83 mg/dL (ref 70–99)
Potassium: 3.8 mmol/L (ref 3.5–5.1)
Sodium: 140 mmol/L (ref 135–145)
Total Bilirubin: 0.6 mg/dL (ref 0.3–1.2)
Total Protein: 8.1 g/dL (ref 6.5–8.1)

## 2019-02-03 LAB — PROTIME-INR
INR: 1 (ref 0.8–1.2)
Prothrombin Time: 12.9 seconds (ref 11.4–15.2)

## 2019-02-03 LAB — CBG MONITORING, ED: Glucose-Capillary: 93 mg/dL (ref 70–99)

## 2019-02-03 LAB — ETHANOL: Alcohol, Ethyl (B): <10 mg/dL (ref <10)

## 2019-02-03 LAB — APTT: aPTT: 33 seconds (ref 24–36)

## 2019-02-03 LAB — SARS CORONAVIRUS 2 BY RT PCR (HOSPITAL ORDER, PERFORMED IN ~~LOC~~ HOSPITAL LAB): SARS Coronavirus 2: NEGATIVE

## 2019-02-03 MED ORDER — ACETAMINOPHEN 325 MG PO TABS: 650.0000 mg | ORAL_TABLET | ORAL | Status: DC | PRN

## 2019-02-03 MED ORDER — SODIUM CHLORIDE 0.9 % IV SOLN
1450.0000 mg | INTRAVENOUS | Status: AC
  Administered 2019-02-03: 1450 mg via INTRAVENOUS
  Filled 2019-02-03: qty 29

## 2019-02-03 MED ORDER — LEVETIRACETAM IN NACL 1000 MG/100ML IV SOLN
1000.0000 mg | Freq: Two times a day (BID) | INTRAVENOUS | Status: DC
  Administered 2019-02-04 – 2019-02-05 (×3): 1000 mg via INTRAVENOUS
  Filled 2019-02-03 (×5): qty 100

## 2019-02-03 MED ORDER — LORAZEPAM 2 MG/ML IJ SOLN
INTRAMUSCULAR | Status: AC
  Administered 2019-02-03: 2 mg
  Filled 2019-02-03: qty 1

## 2019-02-03 MED ORDER — ACETAMINOPHEN 650 MG RE SUPP: 650.0000 mg | RECTAL | Status: DC | PRN

## 2019-02-03 MED ORDER — ONDANSETRON HCL 4 MG/2ML IJ SOLN
4.0000 mg | Freq: Four times a day (QID) | INTRAMUSCULAR | Status: DC | PRN
  Administered 2019-02-04 (×2): 4 mg via INTRAVENOUS
  Filled 2019-02-03 (×2): qty 2

## 2019-02-03 MED ORDER — LEVETIRACETAM IN NACL 1000 MG/100ML IV SOLN
1000.0000 mg | Freq: Once | INTRAVENOUS | Status: AC
  Administered 2019-02-03: 1000 mg via INTRAVENOUS
  Filled 2019-02-03: qty 100

## 2019-02-03 MED ORDER — ONDANSETRON HCL 4 MG/2ML IJ SOLN
4.0000 mg | Freq: Once | INTRAMUSCULAR | Status: AC
  Administered 2019-02-03: 4 mg via INTRAVENOUS
  Filled 2019-02-03: qty 2

## 2019-02-03 MED ORDER — IOHEXOL 350 MG/ML SOLN
100.0000 mL | Freq: Once | INTRAVENOUS | Status: AC | PRN
  Administered 2019-02-03: 75 mL via INTRAVENOUS

## 2019-02-03 MED ORDER — ENOXAPARIN SODIUM 40 MG/0.4ML ~~LOC~~ SOLN
40.0000 mg | Freq: Every day | SUBCUTANEOUS | Status: DC
  Administered 2019-02-04: 40 mg via SUBCUTANEOUS
  Filled 2019-02-03: qty 0.4

## 2019-02-03 MED ORDER — SODIUM CHLORIDE 0.9 % IV SOLN
INTRAVENOUS | Status: AC
  Administered 2019-02-04: 02:00:00 via INTRAVENOUS

## 2019-02-03 MED ORDER — LORAZEPAM 2 MG/ML IJ SOLN
2.0000 mg | INTRAMUSCULAR | Status: AC | PRN
  Administered 2019-02-08 – 2019-02-19 (×2): 2 mg via INTRAVENOUS
  Filled 2019-02-03 (×2): qty 1

## 2019-02-03 MED ORDER — PHENYTOIN SODIUM 50 MG/ML IJ SOLN
100.0000 mg | Freq: Three times a day (TID) | INTRAMUSCULAR | Status: DC
  Administered 2019-02-04 – 2019-02-15 (×34): 100 mg via INTRAVENOUS
  Filled 2019-02-03 (×35): qty 2

## 2019-02-03 NOTE — ED Notes (Addendum)
Pt began having seizure activity with right sided twitching/jerking lasting appx 1 minute and coughing. Dr.Schlossman notified and verbal order for 27m Ativan IV given.

## 2019-02-03 NOTE — ED Notes (Signed)
He does not meet criteria at this time for a stroke swallow screen.

## 2019-02-03 NOTE — ED Notes (Signed)
ED TO INPATIENT HANDOFF REPORT  ED Nurse Name and Phone #: Kallie Locks [phone]  S Name/Age/Gender Travis Briggs 43 y.o. male Room/Bed: RESA/RESA  Code Status   Code Status: Not on file {Patient oriented ZT:IWPY Home/SNF/Other Home  Is this baseline? unknown  Triage Complete: Triage complete  Chief Complaint Seizure  Triage Note EMS were called by an observer at a local bus stop informing them pt. Was "having a seizure". Upon EMS arrival they found pt. Unattended, having tonic-clonic seizure activity. He was given 87m of Versed I.M. he arrives here with eyes open and he tracks with his eyes, however, he is non-verbal. His skin is normal, warm and dry and he is breathing normally. Monitor shows nsr witthout ectopy.   Allergies No Known Allergies  Level of Care/Admitting Diagnosis ED Disposition    ED Disposition Condition Comment   Admit  [id number]]  Level of Care: Progressive [102]  I expect the patient will be discharged within 24 hours: No (not a candidate for 5C-Observation unit)  Covid Evaluation: N/A  Diagnosis: Seizures ([facility] [[[id number]] Admitting Physician: RShela Leff[[[id number]] Attending Physician: RShela Leff[[[id number]] PT Class (Do Not Modify): Observation [104]  PT Acc Code (Do Not Modify): Observation [10022]       B Medical/Surgery History No past medical history on file.    A IV Location/Drains/Wounds Patient Lines/Drains/Airways Status   Active Line/Drains/Airways    Name:   Placement date:   Placement time:   Site:   Days:   Peripheral IV 02/03/19 Left Hand   02/03/19    1620    Hand   less than 1          Intake/Output Last 24 hours  Intake/Output Summary (Last 24 hours) at 02/03/2019 2228 Last data filed at 02/03/2019 2031 Gross per 24 hour  Intake 76.49 ml  Output -  Net 76.49 ml    Labs/Imaging Results for orders placed or performed during the hospital encounter of 02/03/19  (from the past 48 hour(s))  CBG monitoring, ED     Status: None   Collection Time: 02/03/19  4:43 PM  Result Value Ref Range   Glucose-Capillary 93 70 - 99 mg/dL  Ethanol     Status: None   Collection Time: 02/03/19  5:45 PM  Result Value Ref Range   Alcohol, Ethyl (B) <10 <10 mg/dL    Comment: (NOTE) Lowest detectable limit for serum alcohol is 10 mg/dL. For medical purposes only. [facility] [address] [place] [address]  Protime-INR     Status: None   Collection Time: 02/03/19  5:45 PM  Result Value Ref Range   Prothrombin Time 12.9 11.4 - 15.2 seconds   INR 1.0 0.8 - 1.2    Comment: (NOTE) INR goal varies based on device and disease states. [facility]formed at [facility] [address] [place] [address]  APTT     Status: None   Collection Time: 02/03/19  5:45 PM  Result Value Ref Range   aPTT 33 24 - 36 seconds    Comment: Performed at [facility] [address] [place] [id number] CBC     Status: None   Collection Time: 02/03/19  5:45 PM  Result Value Ref Range   WBC 6.5 4.0 - 10.5 K/uL   RBC 4.40 4.22 - 5.81 MIL/uL   Hemoglobin 13.7 13.0 - 17.0  g/dL   HCT 42.4 39.0 - 52.0 %   MCV 96.4 80.0 - 100.0 fL   MCH 31.1 26.0 - 34.0 pg   MCHC 32.3 30.0 - 36.0 g/dL   RDW 12.5 11.5 - 15.5 %   Platelets 201 150 - 400 K/uL   nRBC 0.0 0.0 - 0.2 %    Comment: Performed at [facility], [place] [address], [place], [place] [address]  Differential     Status: None   Collection Time: 02/03/19  5:45 PM  Result Value Ref Range   Neutrophils Relative % 68 %   Neutro Abs 4.5 1.7 - 7.7 K/uL   Lymphocytes Relative 22 %   Lymphs Abs 1.4 0.7 - 4.0 K/uL   Monocytes Relative 8 %   Monocytes Absolute 0.5 0.1 - 1.0 K/uL   Eosinophils Relative 1 %   Eosinophils Absolute 0.1 0.0 - 0.5 K/uL   Basophils Relative 1 %   Basophils Absolute 0.0 0.0 - 0.1 K/uL   Immature  Granulocytes 0 %   Abs Immature Granulocytes 0.02 0.00 - 0.07 K/uL    Comment: Performed at [facility], [place] [address] [place], [place] [address]  Comprehensive metabolic panel     Status: Abnormal   Collection Time: 02/03/19  5:45 PM  Result Value Ref Range   Sodium 140 135 - 145 mmol/L   Potassium 3.8 3.5 - 5.1 mmol/L   Chloride 106 98 - 111 mmol/L   CO2 25 22 - 32 mmol/L   Glucose, Bld 83 70 - 99 mg/dL   BUN 15 8 - 23 mg/dL   Creatinine, Ser 0.94 0.61 - 1.24 mg/dL   Calcium 9.4 8.9 - 10.3 mg/dL   Total Protein 8.1 6.5 - 8.1 g/dL   Albumin 4.9 3.5 - 5.0 g/dL   AST 22 15 - 41 U/L   ALT 31 0 - 44 U/L   Alkaline Phosphatase 89 38 - 126 U/L   Total Bilirubin 0.6 0.3 - 1.2 mg/dL   GFR calc non Af Amer 49 (L) >60 mL/min   GFR calc Af Amer 57 (L) >60 mL/min   Anion gap 9 5 - 15    Comment: Performed at [facility], [place] [address] [place], [place] [address]  SARS Coronavirus 2 [facility] order, Performed in [facility] hospital lab)     Status: None   Collection Time: 02/03/19  7:46 PM  Result Value Ref Range   SARS Coronavirus 2 NEGATIVE NEGATIVE    Comment: (NOTE) If result is NEGATIVE SARS-CoV-2 target nucleic acids are NOT DETECTED. The SARS-CoV-2 RNA is generally detectable in upper and lower  respiratory specimens during the acute phase of infection. The lowest  concentration of SARS-CoV-2 viral copies this assay can detect is 250  copies / mL. A negative result does not preclude SARS-CoV-2 infection  and should not be used as the sole basis for treatment or other  patient management decisions.  A negative result may occur with  improper specimen collection / handling, submission of specimen other  than nasopharyngeal swab, presence of viral mutation(s) within the  areas targeted by this assay, and inadequate number of viral copies  (<250 copies / mL). A negative result must be combined with clinical  observations, patient history,  and epidemiological information. If result is POSITIVE SARS-CoV-2 target nucleic acids are DETECTED. The SARS-CoV-2 RNA is generally detectable in upper and lower  respiratory specimens dur ing the acute phase of infection.  Positive  results are indicative of active infection with SARS-CoV-2.  Clinical  correlation with patient history and other diagnostic information is  necessary to determine patient infection status.  Positive results do  not rule out bacterial infection or co-infection with other viruses. If result is PRESUMPTIVE POSTIVE SARS-CoV-2 nucleic acids MAY BE PRESENT.   A presumptive positive result was obtained on the submitted specimen  and confirmed on repeat testing.  While 2019 novel coronavirus  (SARS-CoV-2) nucleic acids may be present in the submitted sample  additional confirmatory testing may be necessary for epidemiological  and / or clinical management purposes  to differentiate between  SARS-CoV-2 and other Sarbecovirus currently known to infect humans.  If clinically indicated additional testing with an alternate test  methodology [phone]) is advised. The SARS-CoV-2 RNA is generally  detectable in upper and lower respiratory sp ecimens during the acute  phase of infection. The expected result is Negative. Fact Sheet for Patients:  StrictlyIdeas.no Fact Sheet for Healthcare Providers: BankingDealers.co.za This test is not yet approved or cleared by the [place] FDA and has been authorized for detection and/or diagnosis of SARS-CoV-2 by FDA under an Emergency Use Authorization (EUA).  This EUA will remain in effect (meaning this test can be used) for the duration of the COVID-19 declaration under Section 564(b)(1) of the Act, 21 U.S.C. section 360bbb-3(b)(1), unless the authorization is terminated or revoked sooner. Performed at [facility], [place] [address], [place], [place]  [address]    Ct Angio Head W Or Wo Contrast  Result Date: 02/03/2019 CLINICAL DATA:  Seizure EXAM: CT ANGIOGRAPHY HEAD AND NECK TECHNIQUE: Multidetector CT imaging of the head and neck was performed using the standard protocol during bolus administration of intravenous contrast. Multiplanar CT image reconstructions and MIPs were obtained to evaluate the vascular anatomy. Carotid stenosis measurements (when applicable) are obtained utilizing NASCET criteria, using the distal internal carotid diameter as the denominator. CONTRAST:  76m OMNIPAQUE IOHEXOL 350 MG/ML SOLN COMPARISON:  None. FINDINGS: CT HEAD FINDINGS Brain: Chronic encephalomalacia in the left cerebral hemisphere with areas of hypodensity in the left temporal lobe, anterior frontal lobe on the left, and left parietal lobe. Compensatory dilatation of the left lateral ventricle. Hypodensity also in the right anterior frontal lobe. Findings are most consistent with prior head trauma. Ventricles are mildly dilated diffusely however this may be baseline. Negative for acute hemorrhage.  No mass or acute infarct. Vascular: Negative for hyperdense vessel Skull: Extensive craniectomy and cranioplasty on the left. No acute skeletal abnormality. Sinuses: Mild mucosal edema in the paranasal sinuses. Orbits: No orbital lesion identified. Review of the MIP images confirms the above findings CTA NECK FINDINGS Aortic arch: Standard branching. Imaged portion shows no evidence of aneurysm or dissection. No significant stenosis of the major arch vessel origins. Right carotid system: Normal right carotid system. No stenosis or irregularity Left carotid system: Normal left carotid system. No stenosis or irregularity. Vertebral arteries: Left vertebral artery dominant. Both vertebral arteries are relatively small but patent to the basilar. This is consistent with fetal origin of the posterior cerebral artery bilaterally. Skeleton: Cystic lesion of the mandible on the right.  Well-defined sclerotic margins medially and bone thinning or destruction laterally. Internal cystic components bulge laterally and this is likely palpable. This may be associated with right lower pre molar tooth root. Floating molars in the mandible on the right with extensive caries. No fracture. Other neck: No soft tissue mass or adenopathy. Upper chest: Negative Review of the MIP images  confirms the above findings CTA HEAD FINDINGS Anterior circulation: Cavernous carotid widely patent and normal bilaterally. Anterior and middle cerebral arteries widely patent bilaterally without stenosis or vascular malformation. Posterior circulation: Both vertebral arteries patent to the basilar. PICA patent bilaterally. Basilar widely patent. Superior cerebellar arteries widely patent. Fetal origin of the posterior cerebral arteries bilaterally which are widely patent. Venous sinuses: Normal venous enhancement. Anatomic variants: None Delayed phase: No enhancing lesions postcontrast administration Review of the MIP images confirms the above findings IMPRESSION: 1. 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 Encephalomalacia anterior frontal lobes bilaterally. Encephalomalacia left frontal lobe and left parietal lobe. Ventricular enlargement left greater than right due to chronic volume loss. Prior craniectomy and cranioplasty on the left. No acute intracranial abnormality. 2. Negative CTA head neck. No arterial stenosis or vascular malformation. 3. Cystic lesion involving the right mandible. The lesion is expansile with sclerotic margins medially. This is likely palpable. This is associated with right lower pre molar dental root. This is most likely a dental cyst which could be due to a radicular cyst or chronic infection. Electronically Signed   By: Franchot Gallo M.D.   On: 02/03/2019 18:36   Ct Angio Neck W And/or Wo Contrast  Result Date: 02/03/2019 CLINICAL DATA:  Seizure EXAM: CT ANGIOGRAPHY HEAD AND  NECK TECHNIQUE: Multidetector CT imaging of the head and neck was performed using the standard protocol during bolus administration of intravenous contrast. Multiplanar CT image reconstructions and MIPs were obtained to evaluate the vascular anatomy. Carotid stenosis measurements (when applicable) are obtained utilizing NASCET criteria, using the distal internal carotid diameter as the denominator. CONTRAST:  83m OMNIPAQUE IOHEXOL 350 MG/ML SOLN COMPARISON:  None. FINDINGS: CT HEAD FINDINGS Brain: Chronic encephalomalacia in the left cerebral hemisphere with areas of hypodensity in the left temporal lobe, anterior frontal lobe on the left, and left parietal lobe. Compensatory dilatation of the left lateral ventricle. Hypodensity also in the right anterior frontal lobe. Findings are most consistent with prior head trauma. Ventricles are mildly dilated diffusely however this may be baseline. Negative for acute hemorrhage.  No mass or acute infarct. Vascular: Negative for hyperdense vessel Skull: Extensive craniectomy and cranioplasty on the left. No acute skeletal abnormality. Sinuses: Mild mucosal edema in the paranasal sinuses. Orbits: No orbital lesion identified. Review of the MIP images confirms the above findings CTA NECK FINDINGS Aortic arch: Standard branching. Imaged portion shows no evidence of aneurysm or dissection. No significant stenosis of the major arch vessel origins. Right carotid system: Normal right carotid system. No stenosis or irregularity Left carotid system: Normal left carotid system. No stenosis or irregularity. Vertebral arteries: Left vertebral artery dominant. Both vertebral arteries are relatively small but patent to the basilar. This is consistent with fetal origin of the posterior cerebral artery bilaterally. Skeleton: Cystic lesion of the mandible on the right. Well-defined sclerotic margins medially and bone thinning or destruction laterally. Internal cystic components bulge  laterally and this is likely palpable. This may be associated with right lower pre molar tooth root. Floating molars in the mandible on the right with extensive caries. No fracture. Other neck: No soft tissue mass or adenopathy. Upper chest: Negative Review of the MIP images confirms the above findings CTA HEAD FINDINGS Anterior circulation: Cavernous carotid widely patent and normal bilaterally. Anterior and middle cerebral arteries widely patent bilaterally without stenosis or vascular malformation. Posterior circulation: Both vertebral arteries patent to the basilar. PICA patent bilaterally. Basilar widely patent. Superior cerebellar arteries widely patent. Fetal origin of  the posterior cerebral arteries bilaterally which are widely patent. Venous sinuses: Normal venous enhancement. Anatomic variants: None Delayed phase: No enhancing lesions postcontrast administration Review of the MIP images confirms the above findings IMPRESSION: 1. 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 Encephalomalacia anterior frontal lobes bilaterally. Encephalomalacia left frontal lobe and left parietal lobe. Ventricular enlargement left greater than right due to chronic volume loss. Prior craniectomy and cranioplasty on the left. No acute intracranial abnormality. 2. Negative CTA head neck. No arterial stenosis or vascular malformation. 3. Cystic lesion involving the right mandible. The lesion is expansile with sclerotic margins medially. This is likely palpable. This is associated with right lower pre molar dental root. This is most likely a dental cyst which could be due to a radicular cyst or chronic infection. Electronically Signed   By: Franchot Gallo M.D.   On: 02/03/2019 18:36   Ct C-spine No Charge  Result Date: 02/03/2019 CLINICAL DATA:  Seizure.  Neck trauma EXAM: CT CERVICAL SPINE WITHOUT CONTRAST TECHNIQUE: Multidetector CT imaging of the cervical spine was performed without intravenous contrast.  Multiplanar CT image reconstructions were also generated. COMPARISON:  None. FINDINGS: Alignment: Normal Skull base and vertebrae: Negative for fracture Soft tissues and spinal canal: Negative Disc levels:  No significant degenerative change Upper chest: Negative Other: None IMPRESSION: Negative CT cervical spine. Electronically Signed   By: Franchot Gallo M.D.   On: 02/03/2019 18:41    Pending Labs Unresulted Labs (From admission, onward)    Start     Ordered   02/03/19 1703  Urine rapid drug screen (hosp performed)  ONCE - STAT,   STAT     02/03/19 1716   02/03/19 1703  Urinalysis, Routine w reflex microscopic  ONCE - STAT,   STAT     02/03/19 1716          Vitals/Pain Today's Vitals   02/03/19 2030 02/03/19 2100 02/03/19 2130 02/03/19 2200  BP: 129/89 (!) 134/100 135/77 121/81  Pulse: 88 77 73 69  Resp: 19 11 10 12   Temp:      TempSrc:      SpO2: 100% 99% 93% 100%  Weight:      Height:        Isolation Precautions Droplet and Contact precautions  Medications Medications  LORazepam (ATIVAN) 2 MG/ML injection (2 mg  Given 02/03/19 1707)  levETIRAcetam (KEPPRA) IVPB 1000 mg/100 mL premix (0 mg Intravenous Stopped 02/03/19 1804)  iohexol (OMNIPAQUE) 350 MG/ML injection 100 mL (75 mLs Intravenous Contrast Given 02/03/19 1735)  LORazepam (ATIVAN) 2 MG/ML injection (2 mg  Given 02/03/19 1949)  fosPHENYtoin (CEREBYX) 1,450 mg PE in sodium chloride 0.9 % 50 mL IVPB ( Intravenous Stopped 02/03/19 2030)  ondansetron (ZOFRAN) injection 4 mg (4 mg Intravenous Given 02/03/19 2040)    Mobility walks High fall risk   Focused Assessments Neuro Assessment Handoff:  Swallow screen pass?  Cardiac Rhythm: Normal sinus rhythm       Neuro Assessment: Exceptions to WDL Neuro Checks:      Last Documented NIHSS Modified Score:   Has TPA been given? No If patient is a Neuro Trauma and patient is going to OR before floor call report to [place] nurse: [phone] or  [phone]     R Recommendations: See Admitting Provider Note  Report given to:   Additional Notes: Pt unable to do swallow screen due to unable to follow commands appropriately and is having right sided weakness. Unknown history regarding pt due to no identification  on patient.

## 2019-02-03 NOTE — ED Notes (Signed)
As I write this, he is in CT. We had attempted I & O cath, however insufficient urine obtained (only a few drops).

## 2019-02-03 NOTE — H&P (Signed)
History and Physical    Travis Briggs HER:[id number] DOB: 10/10/1876 DOA: 02/03/2019  PCP: No primary care provider on file.  Chief Complaint: Seizure  HPI: 80 D Travis Briggs is a male of unknown age (appears to be in his 30s/early 14s) with a past medical history of traumatic brain injury and prior craniectomy and cranioplasty presenting to the hospital via EMS.  It is unknown at this time whether patient has any additional past medical history.  They were called by an observer at a local bus stop informing them that the patient was having a seizure.  Upon arrival, EMS found the patient unattended having tonic-clonic seizure activity.  The individual who called EMS from the bus stop was no longer present on EMS arrival.  He was given 5 mg Versed IM.  In the ED, patient noted to have right hemiparesis, right gaze preference, and dense aphasia with inability to speak.  He had 2 more episodes of witnessed seizure activity in the ED.  Received a total of 4 mg Ativan, 1000 mg IV Keppra, and 1450 mg of IV fosphenytoin. Patient is very somnolent and nonverbal.  No history could be obtained from him.  Review of Systems: As per HPI otherwise 10 point review of systems negative.  No past medical history on file.   has no history on file for tobacco, alcohol, and drug.  No Known Allergies  No family history on file.  Prior to Admission medications   Not on File    Physical Exam: Vitals:   02/04/19 0015 02/04/19 0016 02/04/19 0030 02/04/19 0045  BP: 103/77  135/86 132/90  Pulse: 80 80 68 64  Resp: 12 20 15 13   Temp:      TempSrc:      SpO2: 100% 100% 99% 100%  Weight:      Height:        Physical Exam  Constitutional: He is oriented to person, place, and time. He appears well-developed and well-nourished. No distress.  HENT:  Craniotomy scars  Eyes: Pupils are equal, round, and reactive to light. Right eye exhibits no discharge. Left eye exhibits no discharge.  Neck: Neck supple.    Cardiovascular: Normal rate, regular rhythm and intact distal pulses.  Pulmonary/Chest: Effort normal. No respiratory distress. He has no wheezes. He has no rales.  Anterior lung fields clear to auscultation  Abdominal: Soft. Bowel sounds are normal. He exhibits no distension. There is no abdominal tenderness. There is no rebound and no guarding.  Musculoskeletal:        General: No edema.  Neurological: He is alert and oriented to person, place, and time.  Very somnolent but waking up to painful stimuli Nonverbal Moving left upper and lower extremities spontaneously No movement of right upper and lower extremities  Skin: Skin is warm and dry. He is not diaphoretic.     Labs on Admission: I have personally reviewed following labs and imaging studies  CBC: Recent Labs  Lab 02/03/19 1745  WBC 6.5  NEUTROABS 4.5  HGB 13.7  HCT 42.4  MCV 96.4  PLT 856   Basic Metabolic Panel: Recent Labs  Lab 02/03/19 1745  NA 140  K 3.8  CL 106  CO2 25  GLUCOSE 83  BUN 15  CREATININE 0.94  CALCIUM 9.4   GFR: Estimated Creatinine Clearance: -3.1 mL/min (by C-G formula based on SCr of 0.94 mg/dL). Liver Function Tests: Recent Labs  Lab 02/03/19 1745  AST 22  ALT 31  ALKPHOS 89  BILITOT 0.6  PROT 8.1  ALBUMIN 4.9   No results for input(s): LIPASE, AMYLASE in the last 168 hours. No results for input(s): AMMONIA in the last 168 hours. Coagulation Profile: Recent Labs  Lab 02/03/19 1745  INR 1.0   Cardiac Enzymes: No results for input(s): CKTOTAL, CKMB, CKMBINDEX, TROPONINI in the last 168 hours. BNP (last 3 results) No results for input(s): PROBNP in the last 8760 hours. HbA1C: No results for input(s): HGBA1C in the last 72 hours. CBG: Recent Labs  Lab 02/03/19 1643  GLUCAP 93   Lipid Profile: No results for input(s): CHOL, HDL, LDLCALC, TRIG, CHOLHDL, LDLDIRECT in the last 72 hours. Thyroid Function Tests: No results for input(s): TSH, T4TOTAL, FREET4, T3FREE,  THYROIDAB in the last 72 hours. Anemia Panel: No results for input(s): VITAMINB12, FOLATE, FERRITIN, TIBC, IRON, RETICCTPCT in the last 72 hours. Urine analysis: No results found for: COLORURINE, APPEARANCEUR, LABSPEC, PHURINE, GLUCOSEU, HGBUR, BILIRUBINUR, KETONESUR, PROTEINUR, UROBILINOGEN, NITRITE, LEUKOCYTESUR  Radiological Exams on Admission: Ct Angio Head W Or Wo Contrast  Result Date: 02/03/2019 CLINICAL DATA:  Seizure EXAM: CT ANGIOGRAPHY HEAD AND NECK TECHNIQUE: Multidetector CT imaging of the head and neck was performed using the standard protocol during bolus administration of intravenous contrast. Multiplanar CT image reconstructions and MIPs were obtained to evaluate the vascular anatomy. Carotid stenosis measurements (when applicable) are obtained utilizing NASCET criteria, using the distal internal carotid diameter as the denominator. CONTRAST:  42m OMNIPAQUE IOHEXOL 350 MG/ML SOLN COMPARISON:  None. FINDINGS: CT HEAD FINDINGS Brain: Chronic encephalomalacia in the left cerebral hemisphere with areas of hypodensity in the left temporal lobe, anterior frontal lobe on the left, and left parietal lobe. Compensatory dilatation of the left lateral ventricle. Hypodensity also in the right anterior frontal lobe. Findings are most consistent with prior head trauma. Ventricles are mildly dilated diffusely however this may be baseline. Negative for acute hemorrhage.  No mass or acute infarct. Vascular: Negative for hyperdense vessel Skull: Extensive craniectomy and cranioplasty on the left. No acute skeletal abnormality. Sinuses: Mild mucosal edema in the paranasal sinuses. Orbits: No orbital lesion identified. Review of the MIP images confirms the above findings CTA NECK FINDINGS Aortic arch: Standard branching. Imaged portion shows no evidence of aneurysm or dissection. No significant stenosis of the major arch vessel origins. Right carotid system: Normal right carotid system. No stenosis or  irregularity Left carotid system: Normal left carotid system. No stenosis or irregularity. Vertebral arteries: Left vertebral artery dominant. Both vertebral arteries are relatively small but patent to the basilar. This is consistent with fetal origin of the posterior cerebral artery bilaterally. Skeleton: Cystic lesion of the mandible on the right. Well-defined sclerotic margins medially and bone thinning or destruction laterally. Internal cystic components bulge laterally and this is likely palpable. This may be associated with right lower pre molar tooth root. Floating molars in the mandible on the right with extensive caries. No fracture. Other neck: No soft tissue mass or adenopathy. Upper chest: Negative Review of the MIP images confirms the above findings CTA HEAD FINDINGS Anterior circulation: Cavernous carotid widely patent and normal bilaterally. Anterior and middle cerebral arteries widely patent bilaterally without stenosis or vascular malformation. Posterior circulation: Both vertebral arteries patent to the basilar. PICA patent bilaterally. Basilar widely patent. Superior cerebellar arteries widely patent. Fetal origin of the posterior cerebral arteries bilaterally which are widely patent. Venous sinuses: Normal venous enhancement. Anatomic variants: None Delayed phase: No enhancing lesions postcontrast administration Review of the MIP images confirms the above findings  IMPRESSION: 1. 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 Encephalomalacia anterior frontal lobes bilaterally. Encephalomalacia left frontal lobe and left parietal lobe. Ventricular enlargement left greater than right due to chronic volume loss. Prior craniectomy and cranioplasty on the left. No acute intracranial abnormality. 2. Negative CTA head neck. No arterial stenosis or vascular malformation. 3. Cystic lesion involving the right mandible. The lesion is expansile with sclerotic margins medially. This is likely  palpable. This is associated with right lower pre molar dental root. This is most likely a dental cyst which could be due to a radicular cyst or chronic infection. Electronically Signed   By: Franchot Gallo M.D.   On: 02/03/2019 18:36   Ct Angio Neck W And/or Wo Contrast  Result Date: 02/03/2019 CLINICAL DATA:  Seizure EXAM: CT ANGIOGRAPHY HEAD AND NECK TECHNIQUE: Multidetector CT imaging of the head and neck was performed using the standard protocol during bolus administration of intravenous contrast. Multiplanar CT image reconstructions and MIPs were obtained to evaluate the vascular anatomy. Carotid stenosis measurements (when applicable) are obtained utilizing NASCET criteria, using the distal internal carotid diameter as the denominator. CONTRAST:  30m OMNIPAQUE IOHEXOL 350 MG/ML SOLN COMPARISON:  None. FINDINGS: CT HEAD FINDINGS Brain: Chronic encephalomalacia in the left cerebral hemisphere with areas of hypodensity in the left temporal lobe, anterior frontal lobe on the left, and left parietal lobe. Compensatory dilatation of the left lateral ventricle. Hypodensity also in the right anterior frontal lobe. Findings are most consistent with prior head trauma. Ventricles are mildly dilated diffusely however this may be baseline. Negative for acute hemorrhage.  No mass or acute infarct. Vascular: Negative for hyperdense vessel Skull: Extensive craniectomy and cranioplasty on the left. No acute skeletal abnormality. Sinuses: Mild mucosal edema in the paranasal sinuses. Orbits: No orbital lesion identified. Review of the MIP images confirms the above findings CTA NECK FINDINGS Aortic arch: Standard branching. Imaged portion shows no evidence of aneurysm or dissection. No significant stenosis of the major arch vessel origins. Right carotid system: Normal right carotid system. No stenosis or irregularity Left carotid system: Normal left carotid system. No stenosis or irregularity. Vertebral arteries: Left  vertebral artery dominant. Both vertebral arteries are relatively small but patent to the basilar. This is consistent with fetal origin of the posterior cerebral artery bilaterally. Skeleton: Cystic lesion of the mandible on the right. Well-defined sclerotic margins medially and bone thinning or destruction laterally. Internal cystic components bulge laterally and this is likely palpable. This may be associated with right lower pre molar tooth root. Floating molars in the mandible on the right with extensive caries. No fracture. Other neck: No soft tissue mass or adenopathy. Upper chest: Negative Review of the MIP images confirms the above findings CTA HEAD FINDINGS Anterior circulation: Cavernous carotid widely patent and normal bilaterally. Anterior and middle cerebral arteries widely patent bilaterally without stenosis or vascular malformation. Posterior circulation: Both vertebral arteries patent to the basilar. PICA patent bilaterally. Basilar widely patent. Superior cerebellar arteries widely patent. Fetal origin of the posterior cerebral arteries bilaterally which are widely patent. Venous sinuses: Normal venous enhancement. Anatomic variants: None Delayed phase: No enhancing lesions postcontrast administration Review of the MIP images confirms the above findings IMPRESSION: 1. 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 Encephalomalacia anterior frontal lobes bilaterally. Encephalomalacia left frontal lobe and left parietal lobe. Ventricular enlargement left greater than right due to chronic volume loss. Prior craniectomy and cranioplasty on the left. No acute intracranial abnormality. 2. Negative CTA head neck. No arterial stenosis  or vascular malformation. 3. Cystic lesion involving the right mandible. The lesion is expansile with sclerotic margins medially. This is likely palpable. This is associated with right lower pre molar dental root. This is most likely a dental cyst which could be due  to a radicular cyst or chronic infection. Electronically Signed   By: Franchot Gallo M.D.   On: 02/03/2019 18:36   Ct C-spine No Charge  Result Date: 02/03/2019 CLINICAL DATA:  Seizure.  Neck trauma EXAM: CT CERVICAL SPINE WITHOUT CONTRAST TECHNIQUE: Multidetector CT imaging of the cervical spine was performed without intravenous contrast. Multiplanar CT image reconstructions were also generated. COMPARISON:  None. FINDINGS: Alignment: Normal Skull base and vertebrae: Negative for fracture Soft tissues and spinal canal: Negative Disc levels:  No significant degenerative change Upper chest: Negative Other: None IMPRESSION: Negative CT cervical spine. Electronically Signed   By: Franchot Gallo M.D.   On: 02/03/2019 18:41    EKG: Independently reviewed.  Sinus rhythm, repolarization abnormality.  No prior EKGs for comparison.  Assessment/Plan Principal Problem:   Status epilepticus [facility]) Active Problems:   Dental cyst   Status epilepticus Patient had 1 witnessed seizure prior to hospital arrival and another 2 seizures in the ED.  Noted to have right hemiparesis and dense aphasia since his arrival to the ED. Received a total of 4 mg Ativan, 1000 mg IV Keppra, and 1450 mg of IV fosphenytoin.  Currently very somnolent and waking up to painful stimuli only.  Afebrile and no leukocytosis.  Not hypoglycemic.  No hypocalcemia.  LFTs normal.  Blood ethanol level negative.  No significant electrolyte abnormalities.  CT angiogram head with findings compatible with chronic traumatic brain injury.  Encephalomalacia of anterior lobes bilaterally.  Encephalomalacia of left frontal lobe and left parietal lobe.  Ventricular enlargement of left greater than right due to chronic volume loss.  Prior craniectomy and cranioplasty on the left.  No acute intracranial abnormality.  CT angiogram neck showing no arterial stenosis or vascular malformation.  -Discussed with neurology.  Patient will be transferred to [facility],  neurology will consult when patient arrives.  Recommendation is to continue IV Keppra 1000 mg every 12 hours, IV phenytoin 100 mg every 8 hours, and Ativan 2 mg PRN seizures.  Repeat phenytoin level in a.m.  Obtain brain MRI without contrast.  Order for stat MRI has been placed. -Monitor in the progressive care unit -Continuous pulse ox -Keep n.p.o. -Aspiration precautions -IV fluid hydration -EEG -UDS pending -UA pending -Check magnesium and phosphorus levels -Monitor CBGs -Appreciate neurology recommendations. -Blood ethanol level negative.  Unknown whether he has a history of alcohol abuse.  Currently not displaying any other signs of alcohol withdrawal.  Give IV thiamine 100 mg.  Do not administer any glucose prior to thiamine.  Dental cyst CT showing cystic lesion involving the right mandible which is associated with the right lower premolar dental root.  This is thought to be most likely a dental cyst which could be due to a radicular cyst or chronic infection.  Patient is afebrile and does not have leukocytosis. -He will need dentistry follow-up.  DVT prophylaxis: Lovenox Code Status: Full code Family Communication: No family available. Disposition Plan: Anticipate discharge after clinical improvement. Consults called: Neurology (Dr. Cheral Marker)  Admission status: Observation, progressive care unit  This chart was dictated using voice recognition software.  Despite best efforts to proofread, errors can occur which can change the documentation meaning.  Shela Leff MD [facility] Pager [phone]  If 7PM-7AM,  please contact night-coverage [email or web address] Password [facility]  02/04/2019, 12:48 AM

## 2019-02-03 NOTE — ED Notes (Signed)
[facility] dispatch notified for need of transport.  

## 2019-02-03 NOTE — ED Notes (Signed)
Bed: RESA Expected date:  Expected time:  Means of arrival:  Comments: Seizure

## 2019-02-03 NOTE — ED Notes (Signed)
Dr.Rathore advised pt is to go straight to MRI at [facility] and that once he gets to floor page Dr.Lindzen at [phone]

## 2019-02-03 NOTE — ED Notes (Signed)
His gaze now does not deviate to the right and he is more alert and tracking with his eyes. He remains in no distress.

## 2019-02-03 NOTE — ED Notes (Signed)
Dr. Billy Fischer met pt. On arrival. She has just started an u/s-guided IV in left upper arm. CT is on standby to take pt. For head CT. He remains non-verbal with eyes deviated to right. He has had some seizure-like activity during which his face twitched and his gaze drifted even further and more consistently to the right. He moves all extremities, however, the movement of his right arm is much less than left.

## 2019-02-03 NOTE — ED Notes (Signed)
Pt continues to have episodes of emesis. MD to be notified.

## 2019-02-03 NOTE — ED Triage Notes (Signed)
EMS were called by an observer at a local bus stop informing them pt. Was "having a seizure". Upon EMS arrival they found pt. Unattended, having tonic-clonic seizure activity. He was given 52m of Versed I.M. he arrives here with eyes open and he tracks with his eyes, however, he is non-verbal. His skin is normal, warm and dry and he is breathing normally. Monitor shows nsr witthout ectopy.

## 2019-02-03 NOTE — ED Notes (Signed)
Per Dr.Rathore pt is to be transferred to [facility] STAT. Report being attempted to be called to 3W at this time.

## 2019-02-03 NOTE — ED Provider Notes (Signed)
[facility] DEPT Provider Note   CSN: [id number] Arrival date & time: 02/03/19  1634    History   Chief Complaint Chief Complaint  Patient presents with  . Seizures    HPI Travis Briggs is a 43 y.o. male.     HPI  [name] presents with concern for seizures from the bus stop.  History is extremely limited as the individual who called EMS from the bus stop was no longer present on EMS arrival, and patient is nonverbal at this time.  Patient noted to have signs of prior cranial surgery.  He is unable to provide any history.  He has no ID, and there is no one at the scene who is able to provide his name.  He arrives with a chief concern for seizure.  Seizure was witnessed by EMS on arrival, he was given 5 mg of Versed.  On arrival to the emergency department, initially, he does not show signs of continuing seizure, is following commands, with the ability to move the left side, however inability to move the right side, right gaze preference, and dense aphasia with inability to speak.  No past medical history on file.  Patient Active Problem List   Diagnosis Date Noted  . Seizures ([facility]) 02/03/2019   TBI Unable to review hx given pt nonverbal. Unknown baseline/hx/name  Home Medications    Prior to Admission medications   Not on File    Family History No family history on file.  Social History Social History   Tobacco Use  . Smoking status: Not on file  Substance Use Topics  . Alcohol use: Not on file  . Drug use: Not on file     Allergies   Patient has no known allergies.   Review of Systems Review of Systems  Unable to perform ROS: Patient nonverbal     Physical Exam Updated Vital Signs BP 131/89   Pulse 61   Temp 98.1 F (36.7 C) (Oral)   Resp 11   Ht 5' 9"  (1.753 m)   Wt 72.6 kg   SpO2 98%   BMI 23.63 kg/m   Physical Exam Vitals signs and nursing note reviewed.  Constitutional:      General: He  is not in acute distress.    Appearance: He is well-developed. He is not diaphoretic.  HENT:     Head: Normocephalic.     Comments: Scars of prior craniotomy Eyes:     Conjunctiva/sclera: Conjunctivae normal.     Pupils: Pupils are equal, round, and reactive to light.     Comments: Right gaze preference however not fixed on my exam.   Neck:     Musculoskeletal: Normal range of motion.  Cardiovascular:     Rate and Rhythm: Normal rate and regular rhythm.  Pulmonary:     Effort: Pulmonary effort is normal. No respiratory distress.  Skin:    General: Skin is warm and dry.  Neurological:     Mental Status: He is alert.     Comments: Moves left side with strength on command Not verbal Right side 0/5 hemiparesis        ED Treatments / Results  Labs (all labs ordered are listed, but only abnormal results are displayed) Labs Reviewed  COMPREHENSIVE METABOLIC PANEL - Abnormal; Notable for the following components:      Result Value   GFR calc non Af Amer 49 (*)    GFR calc Af Amer 57 (*)  All other components within normal limits  SARS CORONAVIRUS 2 (HOSPITAL ORDER, [facility])  ETHANOL  PROTIME-INR  APTT  CBC  DIFFERENTIAL  RAPID URINE DRUG SCREEN, HOSP PERFORMED  URINALYSIS, ROUTINE W REFLEX MICROSCOPIC  PHENYTOIN LEVEL, TOTAL  CBG MONITORING, ED    EKG EKG Interpretation  Date/Time:  Sunday February 03 2019 16:44:08 EDT Ventricular Rate:  76 PR Interval:    QRS Duration: 88 QT Interval:  402 QTC Calculation: 452 R Axis:   116 Text Interpretation:  Right and left arm electrode reversal, interpretation assumes no reversal Sinus rhythm Right axis deviation Repolarization abnormality, prob rate related No previous ECGs available Confirmed by Gareth Morgan [phone]) on 02/03/2019 6:19:02 PM   Radiology Ct Angio Head W Or Wo Contrast  Result Date: 02/03/2019 CLINICAL DATA:  Seizure EXAM: CT ANGIOGRAPHY HEAD AND NECK TECHNIQUE:  Multidetector CT imaging of the head and neck was performed using the standard protocol during bolus administration of intravenous contrast. Multiplanar CT image reconstructions and MIPs were obtained to evaluate the vascular anatomy. Carotid stenosis measurements (when applicable) are obtained utilizing NASCET criteria, using the distal internal carotid diameter as the denominator. CONTRAST:  27m OMNIPAQUE IOHEXOL 350 MG/ML SOLN COMPARISON:  None. FINDINGS: CT HEAD FINDINGS Brain: Chronic encephalomalacia in the left cerebral hemisphere with areas of hypodensity in the left temporal lobe, anterior frontal lobe on the left, and left parietal lobe. Compensatory dilatation of the left lateral ventricle. Hypodensity also in the right anterior frontal lobe. Findings are most consistent with prior head trauma. Ventricles are mildly dilated diffusely however this may be baseline. Negative for acute hemorrhage.  No mass or acute infarct. Vascular: Negative for hyperdense vessel Skull: Extensive craniectomy and cranioplasty on the left. No acute skeletal abnormality. Sinuses: Mild mucosal edema in the paranasal sinuses. Orbits: No orbital lesion identified. Review of the MIP images confirms the above findings CTA NECK FINDINGS Aortic arch: Standard branching. Imaged portion shows no evidence of aneurysm or dissection. No significant stenosis of the major arch vessel origins. Right carotid system: Normal right carotid system. No stenosis or irregularity Left carotid system: Normal left carotid system. No stenosis or irregularity. Vertebral arteries: Left vertebral artery dominant. Both vertebral arteries are relatively small but patent to the basilar. This is consistent with fetal origin of the posterior cerebral artery bilaterally. Skeleton: Cystic lesion of the mandible on the right. Well-defined sclerotic margins medially and bone thinning or destruction laterally. Internal cystic components bulge laterally and this is  likely palpable. This may be associated with right lower pre molar tooth root. Floating molars in the mandible on the right with extensive caries. No fracture. Other neck: No soft tissue mass or adenopathy. Upper chest: Negative Review of the MIP images confirms the above findings CTA HEAD FINDINGS Anterior circulation: Cavernous carotid widely patent and normal bilaterally. Anterior and middle cerebral arteries widely patent bilaterally without stenosis or vascular malformation. Posterior circulation: Both vertebral arteries patent to the basilar. PICA patent bilaterally. Basilar widely patent. Superior cerebellar arteries widely patent. Fetal origin of the posterior cerebral arteries bilaterally which are widely patent. Venous sinuses: Normal venous enhancement. Anatomic variants: None Delayed phase: No enhancing lesions postcontrast administration Review of the MIP images confirms the above findings IMPRESSION: 1. 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 Encephalomalacia anterior frontal lobes bilaterally. Encephalomalacia left frontal lobe and left parietal lobe. Ventricular enlargement left greater than right due to chronic volume loss. Prior craniectomy and cranioplasty on the left. No acute intracranial  abnormality. 2. Negative CTA head neck. No arterial stenosis or vascular malformation. 3. Cystic lesion involving the right mandible. The lesion is expansile with sclerotic margins medially. This is likely palpable. This is associated with right lower pre molar dental root. This is most likely a dental cyst which could be due to a radicular cyst or chronic infection. Electronically Signed   By: Franchot Gallo M.D.   On: 02/03/2019 18:36   Ct Angio Neck W And/or Wo Contrast  Result Date: 02/03/2019 CLINICAL DATA:  Seizure EXAM: CT ANGIOGRAPHY HEAD AND NECK TECHNIQUE: Multidetector CT imaging of the head and neck was performed using the standard protocol during bolus administration of  intravenous contrast. Multiplanar CT image reconstructions and MIPs were obtained to evaluate the vascular anatomy. Carotid stenosis measurements (when applicable) are obtained utilizing NASCET criteria, using the distal internal carotid diameter as the denominator. CONTRAST:  44m OMNIPAQUE IOHEXOL 350 MG/ML SOLN COMPARISON:  None. FINDINGS: CT HEAD FINDINGS Brain: Chronic encephalomalacia in the left cerebral hemisphere with areas of hypodensity in the left temporal lobe, anterior frontal lobe on the left, and left parietal lobe. Compensatory dilatation of the left lateral ventricle. Hypodensity also in the right anterior frontal lobe. Findings are most consistent with prior head trauma. Ventricles are mildly dilated diffusely however this may be baseline. Negative for acute hemorrhage.  No mass or acute infarct. Vascular: Negative for hyperdense vessel Skull: Extensive craniectomy and cranioplasty on the left. No acute skeletal abnormality. Sinuses: Mild mucosal edema in the paranasal sinuses. Orbits: No orbital lesion identified. Review of the MIP images confirms the above findings CTA NECK FINDINGS Aortic arch: Standard branching. Imaged portion shows no evidence of aneurysm or dissection. No significant stenosis of the major arch vessel origins. Right carotid system: Normal right carotid system. No stenosis or irregularity Left carotid system: Normal left carotid system. No stenosis or irregularity. Vertebral arteries: Left vertebral artery dominant. Both vertebral arteries are relatively small but patent to the basilar. This is consistent with fetal origin of the posterior cerebral artery bilaterally. Skeleton: Cystic lesion of the mandible on the right. Well-defined sclerotic margins medially and bone thinning or destruction laterally. Internal cystic components bulge laterally and this is likely palpable. This may be associated with right lower pre molar tooth root. Floating molars in the mandible on the  right with extensive caries. No fracture. Other neck: No soft tissue mass or adenopathy. Upper chest: Negative Review of the MIP images confirms the above findings CTA HEAD FINDINGS Anterior circulation: Cavernous carotid widely patent and normal bilaterally. Anterior and middle cerebral arteries widely patent bilaterally without stenosis or vascular malformation. Posterior circulation: Both vertebral arteries patent to the basilar. PICA patent bilaterally. Basilar widely patent. Superior cerebellar arteries widely patent. Fetal origin of the posterior cerebral arteries bilaterally which are widely patent. Venous sinuses: Normal venous enhancement. Anatomic variants: None Delayed phase: No enhancing lesions postcontrast administration Review of the MIP images confirms the above findings IMPRESSION: 1. 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 Encephalomalacia anterior frontal lobes bilaterally. Encephalomalacia left frontal lobe and left parietal lobe. Ventricular enlargement left greater than right due to chronic volume loss. Prior craniectomy and cranioplasty on the left. No acute intracranial abnormality. 2. Negative CTA head neck. No arterial stenosis or vascular malformation. 3. Cystic lesion involving the right mandible. The lesion is expansile with sclerotic margins medially. This is likely palpable. This is associated with right lower pre molar dental root. This is most likely a dental cyst which could be due  to a radicular cyst or chronic infection. Electronically Signed   By: Franchot Gallo M.D.   On: 02/03/2019 18:36   Ct C-spine No Charge  Result Date: 02/03/2019 CLINICAL DATA:  Seizure.  Neck trauma EXAM: CT CERVICAL SPINE WITHOUT CONTRAST TECHNIQUE: Multidetector CT imaging of the cervical spine was performed without intravenous contrast. Multiplanar CT image reconstructions were also generated. COMPARISON:  None. FINDINGS: Alignment: Normal Skull base and vertebrae: Negative for  fracture Soft tissues and spinal canal: Negative Disc levels:  No significant degenerative change Upper chest: Negative Other: None IMPRESSION: Negative CT cervical spine. Electronically Signed   By: Franchot Gallo M.D.   On: 02/03/2019 18:41    Procedures .Critical Care Performed by: Gareth Morgan, MD Authorized by: Gareth Morgan, MD   Critical care provider statement:    Critical care time (minutes):  45   Critical care was necessary to treat or prevent imminent or life-threatening deterioration of the following conditions:  CNS failure or compromise   Critical care was time spent personally by me on the following activities:  Discussions with consultants, evaluation of patient's response to treatment, examination of patient, ordering and performing treatments and interventions, ordering and review of laboratory studies, ordering and review of radiographic studies, pulse oximetry, re-evaluation of patient's condition, obtaining history from patient or surrogate and review of old charts   (including critical care time)  Medications Ordered in ED Medications  levETIRAcetam (KEPPRA) IVPB 1000 mg/100 mL premix (has no administration in time range)  phenytoin (DILANTIN) injection 100 mg (has no administration in time range)  LORazepam (ATIVAN) 2 MG/ML injection (2 mg  Given 02/03/19 1707)  levETIRAcetam (KEPPRA) IVPB 1000 mg/100 mL premix (0 mg Intravenous Stopped 02/03/19 1804)  iohexol (OMNIPAQUE) 350 MG/ML injection 100 mL (75 mLs Intravenous Contrast Given 02/03/19 1735)  LORazepam (ATIVAN) 2 MG/ML injection (2 mg  Given 02/03/19 1949)  fosPHENYtoin (CEREBYX) 1,450 mg PE in sodium chloride 0.9 % 50 mL IVPB ( Intravenous Stopped 02/03/19 2030)  ondansetron (ZOFRAN) injection 4 mg (4 mg Intravenous Given 02/03/19 2040)     Initial Impression / Assessment and Plan / ED Course  I have reviewed the triage vital signs and the nursing notes.  Pertinent labs & imaging results that were  available during my care of the patient were reviewed by me and considered in my medical decision making (see chart for details).        He arrives with a chief concern for seizure.  Seizure was witnessed by EMS on arrival, he was given 5 mg of Versed.  On arrival to the emergency department, initially, he does not show signs of continuing seizure, is following commands, with the ability to move the left side, however inability to move the right side, right gaze preference, and dense aphasia with inability to speak.  As we did not know the patient's baseline, or last known normal, we were unable to call a code stroke, but given concern that signs and symptoms could represent acute intracranial pathology, ordered CT angio head and neck for evaluation of large vessel cutoff, possible intracranial hemorrhage or other.  CT angioshead and neck shows signs of left-sided encephalomalacia from prior event, correlating with exam although we still do not know his baseline.  He had seizure with right facial movements in the ED and was given 1g of Keppra and 41m of ativan.  He then returned to ability to follow commands, no continuing movements of face.  Neurology was consulted early on arrival however  difficulty apparently with paging system.   Labs otherwise without acute findings.  Patient initially with improvement, vocalizing but still unable to state name, unable to discuss what his baseline is. Alert and following commands moving left side.  He developed right sided rigidity, generalized seizure which resolved just prior to receiving 55m ativan, and fosphenytoin load.    No continuing seizure-like activity. He is sleepy, but protecting his airway, opening eyes to stimulation, following some commands, vocalizing but unable to understand.  Still unknown name, no known family, unable to provide history, suspect some chronic aphasia given CT head findings however we do not know his baseline. COVID ordered  and negative given pt unable to give history.   D[facility] with Dr[facility]  Urinalysis/UDS pending, given pt incontinent during seizure.   Final Clinical Impressions(s) / ED Diagnoses   Final diagnoses:  Seizure ([facility]  Aphasia  Right sided weakness    ED Discharge Orders    None       SGareth Morgan MD 02/03/19 [phone]

## 2019-02-03 NOTE — ED Notes (Signed)
Report attempted and was told nurse would call back for report.

## 2019-02-04 ENCOUNTER — Observation Stay (HOSPITAL_COMMUNITY): Payer: Medicaid Other

## 2019-02-04 ENCOUNTER — Other Ambulatory Visit: Payer: Self-pay

## 2019-02-04 ENCOUNTER — Inpatient Hospital Stay (HOSPITAL_COMMUNITY): Payer: Medicaid Other

## 2019-02-04 DIAGNOSIS — R7881 Bacteremia: Secondary | ICD-10-CM | POA: Diagnosis not present

## 2019-02-04 DIAGNOSIS — G8384 Todd's paralysis (postepileptic): Secondary | ICD-10-CM | POA: Diagnosis present

## 2019-02-04 DIAGNOSIS — G9340 Encephalopathy, unspecified: Secondary | ICD-10-CM | POA: Diagnosis present

## 2019-02-04 DIAGNOSIS — R4701 Aphasia: Secondary | ICD-10-CM | POA: Diagnosis present

## 2019-02-04 DIAGNOSIS — F502 Bulimia nervosa: Secondary | ICD-10-CM | POA: Diagnosis not present

## 2019-02-04 DIAGNOSIS — R531 Weakness: Secondary | ICD-10-CM | POA: Diagnosis not present

## 2019-02-04 DIAGNOSIS — K048 Radicular cyst: Secondary | ICD-10-CM | POA: Diagnosis present

## 2019-02-04 DIAGNOSIS — T1490XA Injury, unspecified, initial encounter: Secondary | ICD-10-CM | POA: Diagnosis not present

## 2019-02-04 DIAGNOSIS — G9389 Other specified disorders of brain: Secondary | ICD-10-CM | POA: Diagnosis present

## 2019-02-04 DIAGNOSIS — D696 Thrombocytopenia, unspecified: Secondary | ICD-10-CM | POA: Diagnosis present

## 2019-02-04 DIAGNOSIS — G40901 Epilepsy, unspecified, not intractable, with status epilepticus: Secondary | ICD-10-CM | POA: Diagnosis not present

## 2019-02-04 DIAGNOSIS — Z79899 Other long term (current) drug therapy: Secondary | ICD-10-CM | POA: Diagnosis not present

## 2019-02-04 DIAGNOSIS — Z87820 Personal history of traumatic brain injury: Secondary | ICD-10-CM | POA: Diagnosis not present

## 2019-02-04 DIAGNOSIS — M2740 Unspecified cyst of jaw: Secondary | ICD-10-CM | POA: Diagnosis present

## 2019-02-04 DIAGNOSIS — B965 Pseudomonas (aeruginosa) (mallei) (pseudomallei) as the cause of diseases classified elsewhere: Secondary | ICD-10-CM | POA: Diagnosis not present

## 2019-02-04 DIAGNOSIS — R066 Hiccough: Secondary | ICD-10-CM | POA: Diagnosis not present

## 2019-02-04 DIAGNOSIS — N390 Urinary tract infection, site not specified: Secondary | ICD-10-CM | POA: Diagnosis not present

## 2019-02-04 DIAGNOSIS — J9601 Acute respiratory failure with hypoxia: Secondary | ICD-10-CM | POA: Diagnosis not present

## 2019-02-04 DIAGNOSIS — I639 Cerebral infarction, unspecified: Secondary | ICD-10-CM | POA: Diagnosis present

## 2019-02-04 DIAGNOSIS — R404 Transient alteration of awareness: Secondary | ICD-10-CM | POA: Diagnosis not present

## 2019-02-04 DIAGNOSIS — G8191 Hemiplegia, unspecified affecting right dominant side: Secondary | ICD-10-CM | POA: Diagnosis present

## 2019-02-04 DIAGNOSIS — G40411 Other generalized epilepsy and epileptic syndromes, intractable, with status epilepticus: Secondary | ICD-10-CM | POA: Diagnosis present

## 2019-02-04 DIAGNOSIS — R9401 Abnormal electroencephalogram [EEG]: Secondary | ICD-10-CM | POA: Diagnosis present

## 2019-02-04 DIAGNOSIS — R569 Unspecified convulsions: Secondary | ICD-10-CM | POA: Diagnosis not present

## 2019-02-04 DIAGNOSIS — Z20828 Contact with and (suspected) exposure to other viral communicable diseases: Secondary | ICD-10-CM | POA: Diagnosis present

## 2019-02-04 LAB — HEMOGLOBIN AND HEMATOCRIT, BLOOD
HCT: 39.7 % (ref 39.0–52.0)
Hemoglobin: 12.7 g/dL — ABNORMAL LOW (ref 13.0–17.0)

## 2019-02-04 LAB — CBC
HCT: 40.3 % (ref 39.0–52.0)
Hemoglobin: 13.2 g/dL (ref 13.0–17.0)
MCH: 30.5 pg (ref 26.0–34.0)
MCHC: 32.8 g/dL (ref 30.0–36.0)
MCV: 93.1 fL (ref 80.0–100.0)
Platelets: 194 10*3/uL (ref 150–400)
RBC: 4.33 MIL/uL (ref 4.22–5.81)
RDW: 12.3 % (ref 11.5–15.5)
WBC: 8 10*3/uL (ref 4.0–10.5)
nRBC: 0 % (ref 0.0–0.2)

## 2019-02-04 LAB — MRSA PCR SCREENING: MRSA by PCR: NEGATIVE

## 2019-02-04 LAB — GLUCOSE, CAPILLARY
Glucose-Capillary: 133 mg/dL — ABNORMAL HIGH (ref 70–99)
Glucose-Capillary: 74 mg/dL (ref 70–99)
Glucose-Capillary: 75 mg/dL (ref 70–99)
Glucose-Capillary: 75 mg/dL (ref 70–99)

## 2019-02-04 MED ORDER — PANTOPRAZOLE SODIUM 40 MG PO TBEC
40.0000 mg | DELAYED_RELEASE_TABLET | Freq: Every day | ORAL | Status: DC
  Administered 2019-02-04 – 2019-02-22 (×19): 40 mg via ORAL
  Filled 2019-02-04 (×19): qty 1

## 2019-02-04 MED ORDER — SENNOSIDES-DOCUSATE SODIUM 8.6-50 MG PO TABS
1.0000 | ORAL_TABLET | Freq: Every evening | ORAL | Status: DC | PRN
  Administered 2019-02-10 – 2019-02-20 (×2): 1 via ORAL
  Filled 2019-02-04 (×2): qty 1

## 2019-02-04 MED ORDER — ACETAMINOPHEN 650 MG RE SUPP: 650.0000 mg | RECTAL | Status: DC | PRN

## 2019-02-04 MED ORDER — ACETAMINOPHEN 325 MG PO TABS: 650.0000 mg | ORAL_TABLET | ORAL | Status: DC | PRN

## 2019-02-04 MED ORDER — THIAMINE HCL 100 MG/ML IJ SOLN
100.0000 mg | Freq: Once | INTRAMUSCULAR | Status: AC
  Administered 2019-02-04: 100 mg via INTRAVENOUS
  Filled 2019-02-04: qty 2

## 2019-02-04 MED ORDER — STROKE: EARLY STAGES OF RECOVERY BOOK: Freq: Once | Status: DC

## 2019-02-04 MED ORDER — SODIUM CHLORIDE 0.9 % IV SOLN
INTRAVENOUS | Status: DC
  Administered 2019-02-04: 16:00:00 via INTRAVENOUS

## 2019-02-04 MED ORDER — ACETAMINOPHEN 160 MG/5ML PO SOLN: 650.0000 mg | ORAL | Status: DC | PRN

## 2019-02-04 MED ORDER — LACTATED RINGERS IV SOLN
INTRAVENOUS | Status: AC
  Administered 2019-02-04 – 2019-02-05 (×2): via INTRAVENOUS

## 2019-02-04 MED ORDER — SODIUM CHLORIDE 0.9 % IV SOLN: INTRAVENOUS | Status: DC

## 2019-02-04 NOTE — ED Notes (Signed)
[facility] here for transport to [facility].
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
## 2019-02-04 NOTE — Progress Notes (Addendum)
Pt vomited x1; emesis dark brown looking. MD paged and notified. Delia Heady RN

## 2019-02-04 NOTE — Consult Note (Addendum)
Neurology Consultation  Reason for Consult: Seizure Referring Physician: [facility] hospital[facility], Dr. Marlowe Sax   History is obtained from: Chart as patient is unable to give history  HPI: Travis Briggs is a 43 y.o. 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  EMS was called after bystanders observed patient having a seizure at a local bus stop.  When EMS arrived they noted him having tonic-clonic seizure activity.  Unfortunately upon arrival the person was called and EMS was no longer available.  Patient was given 5 mg of Versed IM.  In ED he was noted to have right hemiparesis, right gaze preference, and a right dense aphasia with inability to speak.  Patient was administered 1000 mg IV Keppra along with 1450 mg IV fosphenytoin.  Currently patient is unable to give me a history, his thought process is very slow, his answers are inappropriate.  When I asked him if he speaks [place] he smiles laughs and says "no I speak [place]".  He is able to lift both arms with command.  When I ask him any further questions such as the date, year he moans but he is able to count fingers.  He appears to still be postictal- and or continued seizure which EEG has been obtained and will give [place] the answer to this   ED course : Patient was given 5 mg of Versed in the emergency department.  CT Angio of head and neck showed signs of left-sided encephalomalacia from prior event.  Post CT he had right facial movements that looked clinically seizure-like and was given 1 g of Keppra and 2 mg of Ativan.  ROS:  Unable to obtain due to altered mental status.   No past medical history on file.  No family history on file.   Social History:   has no history on file for tobacco, alcohol, and drug.  Medications  Current Facility-Administered Medications:  .  0.9 %  sodium chloride infusion, ,  Intravenous, Continuous, Shela Leff, MD, Last Rate: 125 mL/hr at 02/04/19 0226 .  enoxaparin (LOVENOX) injection 40 mg, 40 mg, Subcutaneous, Daily, Shela Leff, MD .  levETIRAcetam (KEPPRA) IVPB 1000 mg/100 mL premix, 1,000 mg, Intravenous, Q12H, Shela Leff, MD, Last Rate: 400 mL/hr at 02/04/19 0655, 1,000 mg at 02/04/19 0655 .  LORazepam (ATIVAN) injection 2 mg, 2 mg, Intravenous, Q2H PRN, Shela Leff, MD .  ondansetron (ZOFRAN) injection 4 mg, 4 mg, Intravenous, Q6H PRN, Shela Leff, MD, 4 mg at 02/04/19 0225 .  phenytoin (DILANTIN) injection 100 mg, 100 mg, Intravenous, Q8H, Shela Leff, MD, 100 mg at 02/04/19 0453 .  senna-docusate (Senokot-S) tablet 1 tablet, 1 tablet, Oral, QHS PRN, Kerney Elbe, MD   Exam: Current vital signs: BP 119/85 (BP Location: Right Arm)   Pulse 64   Temp 98.1 F (36.7 C) (Oral)   Resp 18   Ht 5' 9"  (1.753 m)   Wt 72.6 kg   SpO2 99%   BMI 23.63 kg/m  Vital signs in last 24 hours: Temp:  [97.7 F (36.5 C)-98.1 F (36.7 C)] 98.1 F (36.7 C) (04/27 0831) Pulse Rate:  [55-88] 64 (04/27 0831) Resp:  [10-20] 18 (04/27 0831) BP: (103-146)/(77-100) 119/85 (04/27 0831) SpO2:  [93 %-100 %] 99 % (04/27 0831) Weight:  [72.6 kg] 72.6 kg (04/26 1642)  Physical Exam  Constitutional: Appears well-developed and well-nourished.  Psych: Affect appropriate to  situation Eyes: No scleral injection HENT: No OP obstrucion Head: Normocephalic.  Cardiovascular: Normal rate and regular rhythm.  Respiratory: Effort normal, non-labored breathing GI: Soft.  No distension. There is no tenderness.  Skin: WDI  Neuro: Mental Status: Patient is confused.  He he is able to count fingers and follow simple commands such as raising his arms.  He has significant difficulty answering questions and if he does answer questions they are incorrect. Cranial Nerves: II: Visual Fields are full.  III,IV, VI: EOMI without ptosis or diploplia.  Pupils equal, round and reactive to light V: Facial sensation is symmetric to temperature VII: Facial movement is symmetric.  VIII: hearing is intact to voice  Motor: Bilateral upper extremities moving antigravity.  Patient could not follow instructions such as pulling and or pushing with bicep and tricep thus I could not get a good sensation of if he was weaker on the right.  He did move his left leg greater than the right leg but with noxious stimuli withdrew quickly on both Sensory: Withdraws bilaterally both upper and lower extremities to noxious stimuli Deep Tendon Reflexes: 2+ and symmetric in the biceps and patellae. Plantars: Toes are downgoing bilaterally.  Cerebellar: Could not get patient to follow this command  Labs I have reviewed labs in epic and the results pertinent to this consultation are:   CBC    Component Value Date/Time   WBC 8.0 02/04/2019 0814   RBC 4.33 02/04/2019 0814   HGB 13.2 02/04/2019 0814   HCT 40.3 02/04/2019 0814   PLT 194 02/04/2019 0814   MCV 93.1 02/04/2019 0814   MCH 30.5 02/04/2019 0814   MCHC 32.8 02/04/2019 0814   RDW 12.3 02/04/2019 0814   LYMPHSABS 1.4 02/03/2019 1745   MONOABS 0.5 02/03/2019 1745   EOSABS 0.1 02/03/2019 1745   BASOSABS 0.0 02/03/2019 1745    CMP     Component Value Date/Time   NA 140 02/04/2019 0814   K 3.7 02/04/2019 0814   CL 106 02/04/2019 0814   CO2 23 02/04/2019 0814   GLUCOSE 95 02/04/2019 0814   BUN 9 02/04/2019 0814   CREATININE 0.91 02/04/2019 0814   CALCIUM 8.6 (L) 02/04/2019 0814   PROT 6.6 02/04/2019 0814   ALBUMIN 3.9 02/04/2019 0814   AST 21 02/04/2019 0814   ALT 29 02/04/2019 0814   ALKPHOS 79 02/04/2019 0814   BILITOT 0.6 02/04/2019 0814   GFRNONAA 51 (L) 02/04/2019 0814   GFRAA 59 (L) 02/04/2019 0814    Lipid Panel  No results found for: CHOL, TRIG, HDL, CHOLHDL, VLDL, LDLCALC, LDLDIRECT   Imaging I have reviewed the images obtained:  CT/CTA of head and neck along with CT of  brain- findings compatible with chronic traumatic brain injury.  Encephalomalacia anterior frontal lobes bilaterally.  Encephalomalacia left frontal lobe and left parietal lobe.  Ventricular enlargement left greater than right due to chronic volume loss.  Prior craniectomy and cranioplasty on the left.  Negative CTA of head and/or neck.  MRI examination of the brain-bifrontal, left temporal and parietal encephalomalacia with no acute intracranial abnormality identified  Etta Quill PA-C [facility] [phone]  M-F  (9:00 am- 5:00 PM)  02/04/2019, 10:21 AM     NEUROHOSPITALIST ADDENDUM Performed a face to face diagnostic evaluation.   I have reviewed the contents of history and physical exam as documented by PA/ARNP/Resident and agree with above documentation.  I have discussed and formulated the above plan as documented. Edits to the note have been  made as needed.  Mr. Claud is a 20 y old (according to patient) with past medical history of traumatic brain injury status post craniotomy/cranioplasty presented with multiple seizures characterized as generalized tonic-clonic.  Had a further seizure in ED noted to have right gaze preference, right hemiparesis and inability to speak.  Patient was loaded with 1 g IV Keppra and 20 mg per KG of IV fosphenytoin.  Has had no further clinical seizures.  On MRI brain-demonstrates areas of encephalomalacia in bifrontal, left temporal and parietal lobes.  On examination patient is awake, has some difficulty with speech, follows commands and answer simple questions.  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 Use caution when using heavy equipment or power tools. Avoid working on ladders or at heights. Take showers instead of baths. Ensure the water temperature is not too high on the home water heater. Do not go swimming alone. Do not lock yourself in a room alone (i.e. bathroom). When caring for infants or small children, sit down when holding, feeding, or changing them to minimize risk of injury to the child in the event you have a seizure. Maintain good sleep hygiene. Avoid alcohol.    If Tayjon Halladay has another seizure, call 911 and bring them back to the ED if:       A.  The seizure lasts longer than 5 minutes.            B.  The patient doesn't wake shortly after the seizure or has new problems such as difficulty seeing, speaking or moving following the seizure       C.  The patient was injured during the seizure       D.  The patient has a temperature over 102 F (39C)       E.  The patient vomited during the seizure and now is having trouble breathing     Karena Addison Aroor MD [facility] [phone]   If 7pm to 7am, please call on call as listed on AMION.

## 2019-02-04 NOTE — Progress Notes (Signed)
EEG Complete  Results Pending

## 2019-02-04 NOTE — Progress Notes (Addendum)
Received patient in the ED and directly transported to MRI; vital signs were stable; patient stated his name was Travis Briggs and he was 43[place] been [place] medication recently.  Patient did vomit while in the MRI machine, pt was immediately removed and rolled on his side to help prevent aspiration.  Patient was cleaned up and transported to 3w36, where he was given Zofran immediately.

## 2019-02-04 NOTE — Evaluation (Signed)
Clinical/Bedside Swallow Evaluation Patient Details  Name: Travis Briggs MRN: [id number] Date of Birth: 10/10/1876  Today's Date: 02/04/2019 Time: SLP Start Time (ACUTE ONLY): 1115 SLP Stop Time (ACUTE ONLY): 1132 SLP Time Calculation (min) (ACUTE ONLY): 17 min  Past Medical History: No past medical history on file. Past Surgical History: The histories are not reviewed yet. Please review them in the "History" navigator section and refresh this [place]. HPI:  Travis Briggs is a 43 y.o. 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  EMS was called after bystanders observed patient having a seizure at a local bus stop.  When EMS arrived they noted him having tonic-clonic seizure activity.  Unfortunately upon arrival the person was called and EMS was no longer available.  Patient was given 5 mg of Versed IM.  In ED he was noted to have right hemiparesis, right gaze preference, and a right dense aphasia with inability to speak.  Patient was administered 1000 mg IV Keppra along with 1450 mg IV fosphenytoin.   Assessment / Plan / Recommendation Clinical Impression  Pt presents with a suspected functional oropharyngeal swallow. Pt eager for PO, alert however limited expressive output. Medical hx includes prior craniotomy and seizures though full medical hx limited. Pt unable to provide pertinent information due to expressive and receptive deficits.  3 ounce water challenge was unremarkable. Oral motor exam also unremarkable. No overt s/sx of aspiration with any PO. Mildly prolonged mastication noted with regular consistencies as well as mild oral residuals. Pt with onset of hiccups following POs, conferred with RN who states pt also had some hiccups this am. Pt left fully upright at bedside. Plans for diet initiation of dysphagia 3( mechancial soft)  and thin liquids with  medicines whole in puree with full supervision. However SLP notes pt with emesis episode following bedside swallow evaluation. Will defer intiation of diet to physician.  SLP to follow up for diet tolerance   SLP Visit Diagnosis: Dysphagia, unspecified (R13.10)    Aspiration Risk  Mild aspiration risk    Diet Recommendation     Medication Administration: Other (Comment)(whole in puree pending MD authorization)    Other  Recommendations Oral Care Recommendations: Oral care BID   Follow up Recommendations Skilled Nursing facility      Frequency and Duration min 1 x/week  2 weeks       Prognosis        Swallow Study   General Date of Onset: 02/03/19 HPI: Travis Briggs is a 43 y.o. 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  EMS was called after bystanders observed patient having a seizure at a local bus stop.  When EMS arrived they noted him having tonic-clonic seizure activity.  Unfortunately upon arrival the person was called and EMS was no longer available.  Patient was given 5 mg of Versed IM.  In ED he was noted to have right hemiparesis, right gaze preference, and a right dense aphasia with inability to speak.  Patient was administered 1000 mg IV Keppra along with 1450 mg IV fosphenytoin. Type of Study: Bedside Swallow Evaluation Previous Swallow Assessment: none on file Diet Prior to this Study: NPO Temperature Spikes Noted: No Respiratory Status: Room air History of Recent Intubation: No Behavior/Cognition: Alert;Confused;Requires cueing Oral  Cavity Assessment: Within Functional Limits Oral Care Completed by SLP: No Oral Cavity - Dentition: Adequate natural dentition Vision: Functional for self-feeding Self-Feeding Abilities: Needs assist Patient Positioning: Upright in bed Baseline Vocal Quality: Normal Volitional Cough: Cognitively unable  to elicit Volitional Swallow: Able to elicit    Oral/Motor/Sensory Function Overall Oral Motor/Sensory Function: Generalized oral weakness   Ice Chips Ice chips: Within functional limits Presentation: Spoon   Thin Liquid Thin Liquid: Within functional limits Presentation: Cup;Straw    Nectar Thick Nectar Thick Liquid: Not tested   Honey Thick Honey Thick Liquid: Not tested   Puree Puree: Impaired Presentation: Spoon Oral Phase Impairments: Reduced labial seal Pharyngeal Phase Impairments: Multiple swallows;Suspected delayed Swallow Other Comments: (onset of hiccups, RN notes pt had hiccups this am as well)   Solid     Solid: Impaired Presentation: Self Fed Oral Phase Impairments: Impaired mastication;Reduced lingual movement/coordination Oral Phase Functional Implications: Impaired mastication;Prolonged oral transit;Oral residue Pharyngeal Phase Impairments: Suspected delayed Swallow;Multiple swallows      Travis Briggs E Travis Flink MA, CCC-SLP  Acute Rehab Speech Language Pathologist  02/04/2019,12:39 PM

## 2019-02-04 NOTE — Progress Notes (Signed)
Attempted MRI 2am, patient was moving throughout scan and then started vomitting and grabbing at the MRI equipment, rolling over on table. Scan stopped, limited images sent to radiologist for review

## 2019-02-04 NOTE — Evaluation (Signed)
Speech Language Pathology Evaluation Patient Details Name: Travis Briggs MRN: [id number] DOB: 10/10/1876 Today's Date: 02/04/2019 Time: 8110-3159 SLP Time Calculation (min) (ACUTE ONLY): 22 min  Problem List:  Patient Active Problem List   Diagnosis Date Noted  . Status epilepticus ([place]) 02/04/2019  . Dental cyst 02/04/2019  . 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 Past Surgical History: The histories are not reviewed yet. Please review them in the "History" navigator section and refresh this [place]. HPI:  Travis Briggs is a 43 y.o. 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  EMS was called after bystanders observed patient having a seizure at a local bus stop.  When EMS arrived they noted him having tonic-clonic seizure activity.  Unfortunately upon arrival the person was called and EMS was no longer available.  Patient was given 5 mg of Versed IM.  In ED he was noted to have right hemiparesis, right gaze preference, and a right dense aphasia with inability to speak.  Patient was administered 1000 mg IV Keppra along with 1450 mg IV fosphenytoin.   Assessment / Plan / Recommendation Clinical Impression  Speech Language Evaluation was limited due to pts expressive and receptive deficits. Pt was unable to follow simple commands consistently. 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   Some functional speech was elicited during questions regarding pts history. Pt stated he previously lived in "[facility] No family or caregivers present or listed in chart. Pt noted to be impulsive during self feeding. Cognitive deficits difficult to assess in setting of aphasia. SLP to follow up to assist cognitive  linguistic recovery.      SLP Assessment  SLP Visit Diagnosis: Aphasia (R47.01)    Follow Up Recommendations  Skilled Nursing facility    Frequency and Duration min 1 x/week  2 weeks      SLP Evaluation Cognition  Overall Cognitive Status: Difficult to assess Arousal/Alertness: Awake/alert Orientation Level: Disoriented X4       Comprehension  Auditory Comprehension Overall Auditory Comprehension: Impaired Yes/No Questions: Impaired    Expression Expression Primary Mode of Expression: Verbal(some gestures) Verbal Expression Overall Verbal Expression: Impaired Initiation: Impaired Level of Generative/Spontaneous Verbalization: Word;Phrase Repetition: Impaired Written Expression Dominant Hand: (used left predominantly however hx of right sided weakness)   Oral / Motor  Oral Motor/Sensory Function Overall Oral Motor/Sensory Function: Generalized oral weakness Motor Speech Overall Motor Speech: Appears within functional limits for tasks assessed   GO                    Yumi Insalaco E Carrigan Delafuente MA, CCC-SLP Acute Rehab Speech Language Pathologist  02/04/2019, 1:28 PM

## 2019-02-04 NOTE — Progress Notes (Signed)
CBG 168. 

## 2019-02-04 NOTE — Evaluation (Addendum)
Physical Therapy Evaluation Patient Details Name: Travis Briggs MRN: [id number] DOB: 10/10/1876 Today's Date: 02/04/2019   History of Present Illness  Pt is an unknown male with unknown age that presenting to hospital after seizure like activity that happened at a bus stop. Per notes, pt with history of TBI with craniotomy. Uknown if other PMH.   Clinical Impression  Pt admitted secondary to problem above with deficits below. Pt with cognitive deficits and responding "yes" and "no" to most questions, however, unsure of accuracy of responses. When asked if pt's name was Travis Briggs, pt responding "no". Asked multiple times throughout session, what pt's name was and pt eventually stating "it was Travis Briggs". Pt with R extremity weakness, likely baseline, as pt with history of TBI. Required mod A to take steps to/from EOB, as pt with notable R knee buckling. Unsure of caregiver support at home and per CSW, do not know if pt has family support. Pt reporting he lives alone. Anticipate pt will require some type of post acute rehab at d/c. Will continue to follow acutely and update recommendations based on pt progression.     Follow Up Recommendations Other (comment);Supervision/Assistance - 24 hour(TBD pending progress; likely CIR vs SNF)    Equipment Recommendations  Other (comment)(TBD)    Recommendations for Other Services       Precautions / Restrictions Precautions Precautions: Fall;Other (comment) Precaution Comments: seizure Restrictions Weight Bearing Restrictions: No      Mobility  Bed Mobility Overal bed mobility: Needs Assistance Bed Mobility: Supine to Sit;Sit to Supine     Supine to sit: Min assist Sit to supine: Min assist   General bed mobility comments: Min A for RLE assist throughout bed mobility tasks.   Transfers Overall transfer level: Needs assistance Equipment used: None Transfers: Sit to/from Stand Sit to Stand: Min assist         General transfer comment: Min  A for lift assist and steadying.   Ambulation/Gait Ambulation/Gait assistance: Mod assist Gait Distance (Feet): 1 Feet Assistive device: 1 person hand held assist Gait Pattern/deviations: Step-to pattern;Decreased step length - right;Decreased step length - left;Decreased weight shift to left Gait velocity: Decreased    General Gait Details: Slow, unsteady gait. Took steps away from and back to EOB. Noted R knee buckling and required mod A for steadying assist.   Stairs            Wheelchair Mobility    Modified Rankin (Stroke Patients Only)       Balance Overall balance assessment: Needs assistance Sitting-balance support: No upper extremity supported;Feet supported Sitting balance-Leahy Scale: Fair     Standing balance support: Single extremity supported Standing balance-Leahy Scale: Poor Standing balance comment: Reliant on UE and external support                              Pertinent Vitals/Pain Pain Assessment: No/denies pain    Home Living Family/patient expects to be discharged to:: Unsure Living Arrangements: Alone Available Help at Discharge: Other (Comment)(unsure) Type of Home: Apartment Home Access: Stairs to enter Entrance Stairs-Rails: Left;Right Entrance Stairs-Number of Steps: 1     Additional Comments: Unsure of accuracy of home information given cognitive deficits.     Prior Function Level of Independence: Independent         Comments: When asking pt if he used anything to walk with, pt stated "no". When asked if he had falls, pt reports yes.  Hand Dominance        Extremity/Trunk Assessment   Upper Extremity Assessment Upper Extremity Assessment: Defer to OT evaluation;RUE deficits/detail RUE Deficits / Details: RUE weakness. Assume baseline given previous TBI. Decreased grip strength and generalized weakness noted throughout RUE.  RUE Coordination: decreased gross motor;decreased fine motor    Lower  Extremity Assessment Lower Extremity Assessment: RLE deficits/detail RLE Deficits / Details: Generalized weakness noted in RLE. Required assist to perform LAQ. Functional weakness noted as well as R knee tended to buckle.     Cervical / Trunk Assessment Cervical / Trunk Assessment: Normal  Communication   Communication: Expressive difficulties(question receptive difficulties as well )  Cognition Arousal/Alertness: Awake/alert Behavior During Therapy: WFL for tasks assessed/performed Overall Cognitive Status: Difficult to assess                                 General Comments: Pt generally responding to questions with yes and no responses. Unsure of accuracy, given cognitive deficits. Pt responding "yes" when asked if he lived in a house and if he lived in an apartment. Asked if pt's name was Travis Briggs, and pt reports no. Asked pt multiple times what his name was and pt stating "it was Travis Briggs." Pt could follow simple, one step mobility commands.       General Comments General comments (skin integrity, edema, etc.): Per CSW, unsure of family contacts at this time. Will need to follow up with family once determined to determine baseline.     Exercises     Assessment/Plan    PT Assessment Patient needs continued PT services  PT Problem List Decreased strength;Decreased activity tolerance;Decreased balance;Decreased mobility;Decreased cognition;Decreased knowledge of use of DME;Decreased safety awareness;Decreased knowledge of precautions;Decreased coordination       PT Treatment Interventions DME instruction;Gait training;Functional mobility training;Therapeutic activities;Therapeutic exercise;Balance training;Stair training;Neuromuscular re-education;Cognitive remediation;Patient/family education    PT Goals (Current goals can be found in the Care Plan section)  Acute Rehab PT Goals PT Goal Formulation: Patient unable to participate in goal setting Time For Goal  Achievement: 02/18/19 Potential to Achieve Goals: Fair    Frequency Min 3X/week   Barriers to discharge Other (comment) Unsure of caregiver support or home environment    Co-evaluation               AM-PAC PT "6 Clicks" Mobility  Outcome Measure Help needed turning from your back to your side while in a flat bed without using bedrails?: A Little Help needed moving from lying on your back to sitting on the side of a flat bed without using bedrails?: A Little Help needed moving to and from a bed to a chair (including a wheelchair)?: A Little Help needed standing up from a chair using your arms (e.g., wheelchair or bedside chair)?: A Little Help needed to walk in hospital room?: A Lot Help needed climbing 3-5 steps with a railing? : Total 6 Click Score: 15    End of Session Equipment Utilized During Treatment: Gait belt Activity Tolerance: Patient tolerated treatment well Patient left: in bed;with call bell/phone within reach;with bed alarm set Nurse Communication: Mobility status PT Visit Diagnosis: Unsteadiness on feet (R26.81);Other abnormalities of gait and mobility (R26.89);Muscle weakness (generalized) (M62.81);History of falling (Z91.81);Difficulty in walking, not elsewhere classified (R26.2)    Time: 6967-8938 PT Time Calculation (min) (ACUTE ONLY): 16 min   Charges:   PT Evaluation $PT Eval Moderate Complexity: 1 Mod  Leighton Ruff, PT, DPT  Acute Rehabilitation Services  Pager: [phone] Office: [phone]   Rudean Hitt 02/04/2019, 5:00 PM

## 2019-02-04 NOTE — Progress Notes (Signed)
PROGRESS NOTE    Travis Briggs  [id number] DOB: 10/10/1876 DOA: 02/03/2019 PCP: No primary care provider on file.   Brief Narrative:  Travis Briggs is Travis Briggs male of unknown age (appears to be in his 30s/early 67s) with Travis Briggs past medical history of traumatic brain injury and prior craniectomy and cranioplasty presenting to the hospital via EMS.  It is unknown at this time whether patient has any additional past medical history.  They were called by an observer [place] bus stop informing them that the patient was having Travis Briggs seizure.  Upon arrival, EMS found the patient unattended having tonic-clonic seizure activity.  The individual who called EMS from the bus stop was no longer present on EMS arrival.  He was given 5 mg Versed IM.  In the ED, patient noted to have right hemiparesis, right gaze preference, and dense aphasia with inability to speak.  He had 2 more episodes of witnessed seizure activity in the ED.  Received Fadumo Heng total of 4 mg Ativan, 1000 mg IV Keppra, and 1450 mg of IV fosphenytoin. Patient is very somnolent and nonverbal.  No history could be obtained from him.  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   MRI was motion degraded and incomplete, but notable for bifrontal, L temporal, and parietal encephalomalacia with no acute intracranial abnormality identified Neurology c/s, appreciate recs - continue IV keppra 1000 mg BID.  Of note, he's also receiving phenytoin 100 mg TID.   SLP eval Negative etoh level UDS pending   Aspiration precautions EEG pending  UA pending   Nausea   Vomiting: pt had 1 episode of dark brown emesis after working with speech.  Start PPI oral Low  suspicion for GI bleed, but follow PM H/H KUB Clear liquid diet for now Continue to monitor      Dental cyst CT showing cystic lesion involving the right mandible which is associated with the right lower premolar dental root.  This is thought to be most likely Travis Briggs dental cyst which could be due to Airen Stiehl radicular cyst or chronic infection.  Patient is afebrile and does not have leukocytosis. -He will need dentistry follow-up.  DVT prophylaxis: SCD Code Status: full  Family Communication: none at bedside Disposition Plan: pending   Consultants:   neurology  Procedures:   none  Antimicrobials:  Anti-infectives (From admission, onward)   None     Subjective: Confused. Doesn't know month.  Says he's in clinic.  Objective: Vitals:   02/04/19 1233 02/04/19 1249 02/04/19 1408 02/04/19 1421  BP: 125/84  129/86   Pulse: 65  75   Resp: 16     Temp:  99.9 F (37.7 C)  99.2 F (37.3 C)  TempSrc:  Oral  Oral  SpO2: 100%  100%   Weight:      Height:        Intake/Output Summary (Last 24 hours) at 02/04/2019 1637 Last data filed at 02/04/2019 1541 Gross per 24 hour  Intake 1170.21 ml  Output --  Net 1170.21 ml   Filed Weights   02/03/19 1642  Weight: 72.6 kg    Examination:  General exam: Appears calm and comfortable  Respiratory system:  Clear to auscultation. Respiratory effort normal. Cardiovascular system: S1 & S2 heard, RRR. Gastrointestinal system: Abdomen is nondistended, soft and nontender. Central nervous system: Alert and oriented. No focal neurological deficits. Extremities: R sided weakness.  Confused Skin: No rashes, lesions or ulcers    Data Reviewed: I have personally reviewed following labs and imaging studies  CBC: Recent Labs  Lab 02/03/19 1745 02/04/19 0814 02/04/19 1530  WBC 6.5 8.0  --   NEUTROABS 4.5  --   --   HGB 13.7 13.2 12.7*  HCT 42.4 40.3 39.7  MCV 96.4 93.1  --   PLT 201 194  --    Basic Metabolic Panel: Recent Labs  Lab  02/03/19 1745 02/04/19 0814  NA 140 140  K 3.8 3.7  CL 106 106  CO2 25 23  GLUCOSE 83 95  BUN 15 9  CREATININE 0.94 0.91  CALCIUM 9.4 8.6*   GFR: Estimated Creatinine Clearance: -3.2 mL/min (by C-G formula based on SCr of 0.91 mg/dL). Liver Function Tests: Recent Labs  Lab 02/03/19 1745 02/04/19 0814  AST 22 21  ALT 31 29  ALKPHOS 89 79  BILITOT 0.6 0.6  PROT 8.1 6.6  ALBUMIN 4.9 3.9   No results for input(s): LIPASE, AMYLASE in the last 168 hours. No results for input(s): AMMONIA in the last 168 hours. Coagulation Profile: Recent Labs  Lab 02/03/19 1745  INR 1.0   Cardiac Enzymes: No results for input(s): CKTOTAL, CKMB, CKMBINDEX, TROPONINI in the last 168 hours. BNP (last 3 results) No results for input(s): PROBNP in the last 8760 hours. HbA1C: No results for input(s): HGBA1C in the last 72 hours. CBG: Recent Labs  Lab 02/03/19 1643 02/04/19 0834 02/04/19 1248  GLUCAP 93 75 74   Lipid Profile: No results for input(s): CHOL, HDL, LDLCALC, TRIG, CHOLHDL, LDLDIRECT in the last 72 hours. Thyroid Function Tests: No results for input(s): TSH, T4TOTAL, FREET4, T3FREE, THYROIDAB in the last 72 hours. Anemia Panel: No results for input(s): VITAMINB12, FOLATE, FERRITIN, TIBC, IRON, RETICCTPCT in the last 72 hours. Sepsis Labs: No results for input(s): PROCALCITON, LATICACIDVEN in the last 168 hours.  Recent Results (from the past 240 hour(s))  SARS Coronavirus 2 [facility] order, Performed in [place] hospital lab)     Status: None   Collection Time: 02/03/19  7:46 PM  Result Value Ref Range Status   SARS Coronavirus 2 NEGATIVE NEGATIVE Final    Comment: (NOTE) If result is NEGATIVE SARS-CoV-2 target nucleic acids are NOT DETECTED. The SARS-CoV-2 RNA is generally detectable in upper and lower  respiratory specimens during the acute phase of infection. The lowest  concentration of SARS-CoV-2 viral copies this assay can detect is 250  copies / mL. Wilmina Maxham  negative result does not preclude SARS-CoV-2 infection  and should not be used as the sole basis for treatment or other  patient management decisions.  Riely Baskett negative result may occur with  improper specimen collection / handling, submission of specimen other  than nasopharyngeal swab, presence of viral mutation(s) within the  areas targeted by this assay, and inadequate number of viral copies  (<250 copies / mL). Gretel Cantu negative result must be combined with clinical  observations, patient history, and epidemiological information. If result is POSITIVE SARS-CoV-2 target nucleic acids are DETECTED. The SARS-CoV-2 RNA is generally detectable in upper and lower  respiratory specimens dur ing the acute phase of infection.  Positive  results are indicative of active infection with SARS-CoV-2.  Clinical  correlation with patient history  and other diagnostic information is  necessary to determine patient infection status.  Positive results do  not rule out bacterial infection or co-infection with other viruses. If result is PRESUMPTIVE POSTIVE SARS-CoV-2 nucleic acids MAY BE PRESENT.   Markon Jares presumptive positive result was obtained on the submitted specimen  and confirmed on repeat testing.  While 2019 novel coronavirus  (SARS-CoV-2) nucleic acids may be present in the submitted sample  additional confirmatory testing may be necessary for epidemiological  and / or clinical management purposes  to differentiate between  SARS-CoV-2 and other Sarbecovirus currently known to infect humans.  If clinically indicated additional testing with an alternate test  methodology [phone]) is advised. The SARS-CoV-2 RNA is generally  detectable in upper and lower respiratory sp ecimens during the acute  phase of infection. The expected result is Negative. Fact Sheet for Patients:  StrictlyIdeas.no Fact Sheet for Healthcare Providers: BankingDealers.co.za This test is not  yet approved or cleared by the [place] FDA and has been authorized for detection and/or diagnosis of SARS-CoV-2 by FDA under an Emergency Use Authorization (EUA).  This EUA will remain in effect (meaning this test can be used) for the duration of the COVID-19 declaration under Section 564(b)(1) of the Act, 21 U.S.C. section 360bbb-3(b)(1), unless the authorization is terminated or revoked sooner. Performed at [facility], [place] [address], [place], [place] [address]   MRSA PCR Screening     Status: None   Collection Time: 02/04/19 11:27 AM  Result Value Ref Range Status   MRSA by PCR NEGATIVE NEGATIVE Final    Comment:        The GeneXpert MRSA Assay (FDA approved for NASAL specimens only), is one component of Tray Klayman comprehensive MRSA colonization surveillance program. It is not intended to diagnose MRSA infection nor to guide or monitor treatment for MRSA infections. Performed at [facility], [place] [address], [place], [place] [address]          Radiology Studies: Ct Angio Head W Or Wo Contrast  Result Date: 02/03/2019 CLINICAL DATA:  Seizure EXAM: CT ANGIOGRAPHY HEAD AND NECK TECHNIQUE: Multidetector CT imaging of the head and neck was performed using the standard protocol during bolus administration of intravenous contrast. Multiplanar CT image reconstructions and MIPs were obtained to evaluate the vascular anatomy. Carotid stenosis measurements (when applicable) are obtained utilizing NASCET criteria, using the distal internal carotid diameter as the denominator. CONTRAST:  37m OMNIPAQUE IOHEXOL 350 MG/ML SOLN COMPARISON:  None. FINDINGS: CT HEAD FINDINGS Brain: Chronic encephalomalacia in the left cerebral hemisphere with areas of hypodensity in the left temporal lobe, anterior frontal lobe on the left, and left parietal lobe. Compensatory dilatation of the left lateral ventricle. Hypodensity also in the right anterior frontal lobe. Findings are most  consistent with prior head trauma. Ventricles are mildly dilated diffusely however this may be baseline. Negative for acute hemorrhage.  No mass or acute infarct. Vascular: Negative for hyperdense vessel Skull: Extensive craniectomy and cranioplasty on the left. No acute skeletal abnormality. Sinuses: Mild mucosal edema in the paranasal sinuses. Orbits: No orbital lesion identified. Review of the MIP images confirms the above findings CTA NECK FINDINGS Aortic arch: Standard branching. Imaged portion shows no evidence of aneurysm or dissection. No significant stenosis of the major arch vessel origins. Right carotid system: Normal right carotid system. No stenosis or irregularity Left carotid system: Normal left carotid system. No stenosis or irregularity. Vertebral arteries: Left vertebral artery dominant. Both vertebral arteries are relatively small  but patent to the basilar. This is consistent with fetal origin of the posterior cerebral artery bilaterally. Skeleton: Cystic lesion of the mandible on the right. Well-defined sclerotic margins medially and bone thinning or destruction laterally. Internal cystic components bulge laterally and this is likely palpable. This may be associated with right lower pre molar tooth root. Floating molars in the mandible on the right with extensive caries. No fracture. Other neck: No soft tissue mass or adenopathy. Upper chest: Negative Review of the MIP images confirms the above findings CTA HEAD FINDINGS Anterior circulation: Cavernous carotid widely patent and normal bilaterally. Anterior and middle cerebral arteries widely patent bilaterally without stenosis or vascular malformation. Posterior circulation: Both vertebral arteries patent to the basilar. PICA patent bilaterally. Basilar widely patent. Superior cerebellar arteries widely patent. Fetal origin of the posterior cerebral arteries bilaterally which are widely patent. Venous sinuses: Normal venous enhancement. Anatomic  variants: None Delayed phase: No enhancing lesions postcontrast administration Review of the MIP images confirms the above findings IMPRESSION: 1. 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 Encephalomalacia anterior frontal lobes bilaterally. Encephalomalacia left frontal lobe and left parietal lobe. Ventricular enlargement left greater than right due to chronic volume loss. Prior craniectomy and cranioplasty on the left. No acute intracranial abnormality. 2. Negative CTA head neck. No arterial stenosis or vascular malformation. 3. Cystic lesion involving the right mandible. The lesion is expansile with sclerotic margins medially. This is likely palpable. This is associated with right lower pre molar dental root. This is most likely Shyra Emile dental cyst which could be due to Laya Letendre radicular cyst or chronic infection. Electronically Signed   By: Franchot Gallo M.D.   On: 02/03/2019 18:36   Ct Angio Neck W And/or Wo Contrast  Result Date: 02/03/2019 CLINICAL DATA:  Seizure EXAM: CT ANGIOGRAPHY HEAD AND NECK TECHNIQUE: Multidetector CT imaging of the head and neck was performed using the standard protocol during bolus administration of intravenous contrast. Multiplanar CT image reconstructions and MIPs were obtained to evaluate the vascular anatomy. Carotid stenosis measurements (when applicable) are obtained utilizing NASCET criteria, using the distal internal carotid diameter as the denominator. CONTRAST:  80m OMNIPAQUE IOHEXOL 350 MG/ML SOLN COMPARISON:  None. FINDINGS: CT HEAD FINDINGS Brain: Chronic encephalomalacia in the left cerebral hemisphere with areas of hypodensity in the left temporal lobe, anterior frontal lobe on the left, and left parietal lobe. Compensatory dilatation of the left lateral ventricle. Hypodensity also in the right anterior frontal lobe. Findings are most consistent with prior head trauma. Ventricles are mildly dilated diffusely however this may be baseline. Negative for acute  hemorrhage.  No mass or acute infarct. Vascular: Negative for hyperdense vessel Skull: Extensive craniectomy and cranioplasty on the left. No acute skeletal abnormality. Sinuses: Mild mucosal edema in the paranasal sinuses. Orbits: No orbital lesion identified. Review of the MIP images confirms the above findings CTA NECK FINDINGS Aortic arch: Standard branching. Imaged portion shows no evidence of aneurysm or dissection. No significant stenosis of the major arch vessel origins. Right carotid system: Normal right carotid system. No stenosis or irregularity Left carotid system: Normal left carotid system. No stenosis or irregularity. Vertebral arteries: Left vertebral artery dominant. Both vertebral arteries are relatively small but patent to the basilar. This is consistent with fetal origin of the posterior cerebral artery bilaterally. Skeleton: Cystic lesion of the mandible on the right. Well-defined sclerotic margins medially and bone thinning or destruction laterally. Internal cystic components bulge laterally and this is likely palpable. This may be associated with right  lower pre molar tooth root. Floating molars in the mandible on the right with extensive caries. No fracture. Other neck: No soft tissue mass or adenopathy. Upper chest: Negative Review of the MIP images confirms the above findings CTA HEAD FINDINGS Anterior circulation: Cavernous carotid widely patent and normal bilaterally. Anterior and middle cerebral arteries widely patent bilaterally without stenosis or vascular malformation. Posterior circulation: Both vertebral arteries patent to the basilar. PICA patent bilaterally. Basilar widely patent. Superior cerebellar arteries widely patent. Fetal origin of the posterior cerebral arteries bilaterally which are widely patent. Venous sinuses: Normal venous enhancement. Anatomic variants: None Delayed phase: No enhancing lesions postcontrast administration Review of the MIP images confirms the above  findings IMPRESSION: 1. 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 Encephalomalacia anterior frontal lobes bilaterally. Encephalomalacia left frontal lobe and left parietal lobe. Ventricular enlargement left greater than right due to chronic volume loss. Prior craniectomy and cranioplasty on the left. No acute intracranial abnormality. 2. Negative CTA head neck. No arterial stenosis or vascular malformation. 3. Cystic lesion involving the right mandible. The lesion is expansile with sclerotic margins medially. This is likely palpable. This is associated with right lower pre molar dental root. This is most likely Shunda Rabadi dental cyst which could be due to Elliott Lasecki radicular cyst or chronic infection. Electronically Signed   By: Franchot Gallo M.D.   On: 02/03/2019 18:36   Mr Brain Wo Contrast  Result Date: 02/04/2019 CLINICAL DATA:  [name] with tonic clonic seizure. History of prior traumatic brain injury, head CT and CTA head and neck earlier tonight at [place]: MRI HEAD WITHOUT CONTRAST TECHNIQUE: Multiplanar, multiecho pulse sequences of the brain and surrounding structures were obtained without intravenous contrast. COMPARISON:  [facility] head CT and CTA earlier tonight. FINDINGS: The examination had to be discontinued prior to completion due to vomiting, agitation. Coronal T2 and axial T1 weighted imaging not obtained. Axial T2, FLAIR and SWI degraded by motion. Brain: Ex vacuo ventricular enlargement. No intracranial mass effect or midline shift. No restricted diffusion or evidence of acute infarction. No diffusion abnormality in the hippocampal formations. Anterior bifrontal encephalomalacia. Fairly extensive left temporal lobe encephalomalacia. Left parietal lobe encephalomalacia. SWI suggests some chronic hemosiderin along the left convexity but is substantially motion degraded. Brainstem volume loss. Cerebellum appears relatively preserved. Punctate nonspecific  T2 hyperintensity at the right globus pallidus. Negative pituitary and cervicomedullary junction. Vascular: Major intracranial vascular flow voids appear preserved. Skull and upper cervical spine: Negative visible cervical spine. Prior craniotomy. Visualized bone marrow signal is within normal limits. Sinuses/Orbits: Negative orbits. Paranasal sinus mucosal thickening and left maxillary retention cysts. Other: Mastoids are clear.  Scalp soft tissue scarring. IMPRESSION: 1. Intermittently degraded by motion and discontinued prior to completion due to vomiting, agitation. 2. Bifrontal, left temporal and parietal encephalomalacia with no acute intracranial abnormality identified. Electronically Signed   By: Genevie Ann M.D.   On: 02/04/2019 02:40   Ct C-spine No Charge  Result Date: 02/03/2019 CLINICAL DATA:  Seizure.  Neck trauma EXAM: CT CERVICAL SPINE WITHOUT CONTRAST TECHNIQUE: Multidetector CT imaging of the cervical spine was performed without intravenous contrast. Multiplanar CT image reconstructions were also generated. COMPARISON:  None. FINDINGS: Alignment: Normal Skull base and vertebrae: Negative for fracture Soft tissues and spinal canal: Negative Disc levels:  No significant degenerative change Upper chest: Negative Other: None IMPRESSION: Negative CT cervical spine. Electronically Signed   By: Franchot Gallo M.D.   On: 02/03/2019 18:41  Dg Chest Port 1 View  Result Date: 02/04/2019 CLINICAL DATA:  [name] with seizure. History of prior traumatic brain injury, head CT CTA head and neck earlier tonight at [facility]: PORTABLE CHEST 1 VIEW COMPARISON:  Abdomen radiographs reported separately. FINDINGS: Portable AP upright view at 0137 hours. Low normal lung volumes. Normal cardiac size and mediastinal contours. Visualized tracheal air column is within normal limits. Chronic lateral left sixth through 8th rib fractures. Allowing for portable technique the lungs are clear.  Negative visible bowel gas pattern. No acute osseous abnormality identified. IMPRESSION: 1.  No acute cardiopulmonary abnormality. 2. Chronic left lateral rib fractures. Electronically Signed   By: Genevie Ann M.D.   On: 02/04/2019 02:09   Dg Abd 2 Views  Result Date: 02/04/2019 CLINICAL DATA:  [name] with seizure. History of prior traumatic brain injury, head CT CTA head and neck earlier tonight at [place]: ABDOMEN - 2 VIEW COMPARISON:  None. FINDINGS: Upright and supine views. Excreted IV contrast in the urinary bladder. No pneumoperitoneum. Non obstructed bowel gas pattern. Chronic appearing posterior left 6th through 8th rib fractures. No acute osseous abnormality identified. Normal mediastinal contour. Negative visible lungs. IMPRESSION: 1. Normal bowel gas pattern, no free air. 2. Excreted IV contrast in the urinary bladder from CTA earlier tonight at [facility]. Electronically Signed   By: Genevie Ann M.D.   On: 02/04/2019 02:08        Scheduled Meds:  enoxaparin (LOVENOX) injection  40 mg Subcutaneous Daily   pantoprazole  40 mg Oral Daily   phenytoin (DILANTIN) IV  100 mg Intravenous Q8H   Continuous Infusions:  sodium chloride 125 mL/hr at 02/04/19 1613   levETIRAcetam Stopped (02/04/19 0700)     LOS: 0 days    Time spent: over 30 min    Fayrene Helper, MD [facility] PagerAMION  If 7PM-7AM, please contact night-coverage [email or web address] Password [facility] 02/04/2019, 4:37 PM

## 2019-02-04 NOTE — Progress Notes (Signed)
MD notified of arrival of patient to room 3w36

## 2019-02-05 ENCOUNTER — Inpatient Hospital Stay (HOSPITAL_COMMUNITY): Payer: Medicaid Other

## 2019-02-05 LAB — CBC
HCT: 38.6 % — ABNORMAL LOW (ref 39.0–52.0)
Hemoglobin: 12.7 g/dL — ABNORMAL LOW (ref 13.0–17.0)
MCH: 30.6 pg (ref 26.0–34.0)
MCHC: 32.9 g/dL (ref 30.0–36.0)
MCV: 93 fL (ref 80.0–100.0)
Platelets: 194 10*3/uL (ref 150–400)
RBC: 4.15 MIL/uL — ABNORMAL LOW (ref 4.22–5.81)
RDW: 12.2 % (ref 11.5–15.5)
WBC: 8.1 10*3/uL (ref 4.0–10.5)
nRBC: 0 % (ref 0.0–0.2)

## 2019-02-05 LAB — LIPID PANEL
Cholesterol: 132 mg/dL (ref 0–200)
HDL: 41 mg/dL (ref >40)
LDL Cholesterol: 66 mg/dL (ref 0–99)
Total CHOL/HDL Ratio: 3.2 RATIO
Triglycerides: 125 mg/dL (ref <150)
VLDL: 25 mg/dL (ref 0–40)

## 2019-02-05 LAB — HEMOGLOBIN A1C
Hgb A1c MFr Bld: 5.3 % (ref 4.8–5.6)
Mean Plasma Glucose: 105.41 mg/dL

## 2019-02-05 LAB — GLUCOSE, CAPILLARY
Glucose-Capillary: 102 mg/dL — ABNORMAL HIGH (ref 70–99)
Glucose-Capillary: 110 mg/dL — ABNORMAL HIGH (ref 70–99)
Glucose-Capillary: 97 mg/dL (ref 70–99)

## 2019-02-05 LAB — PHENYTOIN LEVEL, TOTAL: Phenytoin Lvl: 20.2 ug/mL — ABNORMAL HIGH (ref 10.0–20.0)

## 2019-02-05 LAB — MAGNESIUM: Magnesium: 2 mg/dL (ref 1.7–2.4)

## 2019-02-05 MED ORDER — SODIUM CHLORIDE 0.9 % IV SOLN
100.0000 mg | Freq: Two times a day (BID) | INTRAVENOUS | Status: DC
  Filled 2019-02-05 (×3): qty 10

## 2019-02-05 MED ORDER — METOCLOPRAMIDE HCL 5 MG/ML IJ SOLN
5.0000 mg | Freq: Once | INTRAMUSCULAR | Status: DC | PRN
  Filled 2019-02-05: qty 2

## 2019-02-05 MED ORDER — LEVETIRACETAM IN NACL 1500 MG/100ML IV SOLN
1500.0000 mg | Freq: Two times a day (BID) | INTRAVENOUS | Status: DC
  Administered 2019-02-05 – 2019-02-15 (×19): 1500 mg via INTRAVENOUS
  Filled 2019-02-05 (×20): qty 100

## 2019-02-05 MED ORDER — SODIUM CHLORIDE 0.9 % IV SOLN
200.0000 mg | Freq: Once | INTRAVENOUS | Status: AC
  Administered 2019-02-05: 200 mg via INTRAVENOUS
  Filled 2019-02-05: qty 20

## 2019-02-05 NOTE — Evaluation (Signed)
Occupational Therapy Evaluation Patient Details Name: Travis Briggs MRN: [id number] DOB: 10/10/1876 Today's Date: 02/05/2019    History of Present Illness    Pt is an unknown male with unknown age that presenting to hospital after seizure like activity that happened at a bus stop. Per notes, pt with history of TBI with craniotomy. Uknown if other PMH.      Clinical Impression   Pt seen for OT eval today - pt has h/o of TBI and has both receptive and expressive language deficits. 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 Pt's prior living arrangements and support systems unknown at this time - pt may be homeless.  Pt will benefit from skilled OT to address these deficits and maximize independence.     Follow Up Recommendations  SNF(as pt may not have any assistance and maybe homeless)    Equipment Recommendations   TBD    Recommendations for Other Services       Precautions / Restrictions Precautions Precautions: Fall;Other (comment) Precaution Comments: seizure Restrictions Weight Bearing Restrictions: No      Mobility Bed Mobility Overal bed mobility: Needs Assistance Bed Mobility: Supine to Sit       Sit to supine: Supervision      Transfers Overall transfer level: Needs assistance Equipment used: None Transfers: Sit to/from Stand Sit to Stand: Min assist              Balance Overall balance assessment: Needs assistance Sitting-balance support: No upper extremity supported;Feet supported Sitting balance-Leahy Scale: Fair     Standing balance support: No upper extremity supported Standing balance-Leahy Scale: Poor Standing balance comment: min steadying to min a pt with occassional LOB.  No UE support today                           ADL either performed or assessed with clinical judgement   ADL Overall ADL's : Needs assistance/impaired Eating/Feeding: Minimal  assistance;Supervision/ safety(per ST recommendation) Eating/Feeding Details (indicate cue type and reason): assist to cut. Pt initially had diffculty manipulating spoon with L hand but quickly adapted Grooming: Wash/dry hands;Wash/dry face;Oral care;Minimal assistance;Standing Grooming Details (indicate cue type and reason): contact guard for standing, mod cues for seqencing for oral care. Pt at times needed hand over hand to initiate brushing with L hand - possible apraxia. Pt also "held" liquid in mouth and had difficulty spitting  Upper Body Bathing: Minimal assistance   Lower Body Bathing: Moderate assistance   Upper Body Dressing : Minimal assistance   Lower Body Dressing: Moderate assistance   Toilet Transfer: Minimal assistance   Toileting- Clothing Manipulation and Hygiene: Minimal assistance       Functional mobility during ADLs: Minimal assistance General ADL Comments: PTwith LOB to R with distraction as well as with functional ambulation     Vision Patient Visual Report: Other (comment)(difficult to assess but no apparent deficits) Additional Comments: will monitor via functional tasks     Perception     Praxis Praxis Praxis-Other Comments: evident when attempting to brush teeth, spit and initially using utensils to eat    Pertinent Vitals/Pain Pain Assessment: Faces Faces Pain Scale: Hurts a little bit Pain Location: indicates RUE and RLE by pointing when asked Pain Descriptors / Indicators: Grimacing Pain Intervention(s): Monitored during session;Limited activity within patient's tolerance     Hand Dominance     Extremity/Trunk Assessment Upper Extremity Assessment Upper Extremity Assessment:  RUE deficits/detail RUE Deficits / Details: RUE weakness. May baseline given previous TBI. Decreased grip strength and generalized weakness noted throughout RUE. Pt able to use RUE however needs mod - max cues to use in functional activity RUE: Unable to fully assess  due to pain RUE Sensation: (dificult to assess. Pt does not spontanously use RUE and indicates some discomfort - suspect sensory deficits and possible parathesias)   Lower Extremity Assessment Lower Extremity Assessment: Defer to PT evaluation       Communication Communication Communication: Expressive difficulties(receptive difficulties as well )   Cognition Arousal/Alertness: Awake/alert Behavior During Therapy: WFL for tasks assessed/performed Overall Cognitive Status: Difficult to assess Area of Impairment: Awareness;Orientation                 Orientation Level: Situation;Place(pt indicated 'no" when asked if he knew where he was)             General Comments: Pt generally responding to questions with yes and no responses. Unsure of accuracy, given cognitive deficits. Pt resAsked if pt's name was [name], and pt reports no. Pt could follow simple, one step mobility and ADL commands.    General Comments       Exercises     Shoulder Instructions      Home Living Family/patient expects to be discharged to:: Unsure Living Arrangements: Alone Available Help at Discharge: Other (Comment)(unsure) Type of Home: Apartment Home Access: Stairs to enter   Entrance Stairs-Rails: Left;Right                     Additional Comments: Unsure of accuracy of home information given cognitive deficits.   Lives With: (unable to attain)    Prior Functioning/Environment Level of Independence: Independent        Comments: When asking pt if he used anything to walk with, pt stated "no". When asked if he had falls, pt reports yes.          OT Problem List: Decreased strength;Decreased cognition;Impaired UE functional use;Impaired balance (sitting and/or standing);Decreased coordination;Decreased safety awareness;Impaired sensation;Pain      OT Treatment/Interventions: Self-care/ADL training;Therapeutic exercise;Neuromuscular education;Patient/family  education;Cognitive remediation/compensation;Therapeutic activities;Balance training    OT Goals(Current goals can be found in the care plan section) Acute Rehab OT Goals Patient Stated Goal: unable to state OT Goal Formulation: Patient unable to participate in goal setting Time For Goal Achievement: 02/12/19 ADL Goals Pt Will Perform Eating: with modified independence Pt Will Perform Grooming: with modified independence Pt Will Perform Upper Body Bathing: with supervision Pt Will Perform Lower Body Bathing: with supervision Pt Will Perform Upper Body Dressing: with supervision Pt Will Perform Lower Body Dressing: with min assist Pt Will Transfer to Toilet: with supervision  OT Frequency: Min 3X/week   Barriers to D/C: Other (comment)  Pt unable to provide any information.  Pt may be homeless and support systems unknown       Co-evaluation              AM-PAC OT "6 Clicks" Daily Activity     Outcome Measure Help from another person eating meals?: A Little Help from another person taking care of personal grooming?: A Lot Help from another person toileting, which includes using toliet, bedpan, or urinal?: A Lot Help from another person bathing (including washing, rinsing, drying)?: A Lot Help from another person to put on and taking off regular upper body clothing?: A Lot Help from another person to put on and taking off regular lower body  clothing?: A Lot 6 Click Score: 13   End of Session Equipment Utilized During Treatment: Gait belt  Activity Tolerance: Patient tolerated treatment well Patient left: in chair;with call bell/phone within reach;with chair alarm set  OT Visit Diagnosis: Unsteadiness on feet (R26.81);Muscle weakness (generalized) (M62.81);Apraxia (R48.2);Other symptoms and signs involving the nervous system (R29.898);Other symptoms and signs involving cognitive function;Hemiplegia and hemiparesis Hemiplegia - Right/Left: Right Hemiplegia -  dominant/non-dominant: (unknown)                Time: 1030-1056 OT Time Calculation (min): 26 min Charges:  OT General Charges $OT Visit: 1 Visit OT Evaluation $OT Eval Moderate Complexity: 1 Mod   Judieth Keens, Estrella Myrtle, OTR/L 02/05/2019, 1:04 PM

## 2019-02-05 NOTE — Progress Notes (Signed)
Physical Therapy Treatment Patient Details Name: Travis Briggs MRN: [id number] DOB: 10/10/1876 Today's Date: 02/05/2019    History of Present Illness Pt is an unknown male with unknown age that presenting to hospital after seizure like activity that happened at a bus stop. Per notes, pt with history of TBI with craniotomy. Uknown if other PMH.     PT Comments    Pt performed gait training and ADLs during co treatment session.  Pt continues to present with R sided weakness.  He is progressing and currently an excellent candidate for aggressive CIR therapies. Plan for higher level balance activities next session.      Follow Up Recommendations  Other (comment);Supervision/Assistance - 24 hour(TBD pending progress CIR vs. SNF)     Equipment Recommendations  Other (comment)(TBD)    Recommendations for Other Services       Precautions / Restrictions Precautions Precautions: Fall;Other (comment) Precaution Comments: seizure Restrictions Weight Bearing Restrictions: No    Mobility  Bed Mobility Overal bed mobility: Needs Assistance Bed Mobility: Supine to Sit     Supine to sit: Min assist Sit to supine: Supervision   General bed mobility comments: for trunk elevation.   Transfers Overall transfer level: Needs assistance Equipment used: None Transfers: Sit to/from Stand Sit to Stand: Min assist;+2 physical assistance         General transfer comment: Min A for lift assist and steadying.   Ambulation/Gait Ambulation/Gait assistance: +2 physical assistance;Min assist(x 1 instance of buckling required moderate assistance to correct.  ) Gait Distance (Feet): 80 Feet Assistive device: 1 person hand held assist Gait Pattern/deviations: Step-to pattern;Decreased step length - right;Decreased step length - left;Decreased weight shift to left     General Gait Details: Pt required cues for R heel strike to improve knee ext in stance phase.  Buckling x1 noted.     Stairs             Wheelchair Mobility    Modified Rankin (Stroke Patients Only)       Balance Overall balance assessment: Needs assistance Sitting-balance support: No upper extremity supported;Feet supported Sitting balance-Leahy Scale: Fair     Standing balance support: No upper extremity supported Standing balance-Leahy Scale: Poor Standing balance comment: min steadying to min a pt with occassional LOB.  No UE support today                            Cognition Arousal/Alertness: Awake/alert Behavior During Therapy: WFL for tasks assessed/performed Overall Cognitive Status: Difficult to assess Area of Impairment: Awareness;Orientation                 Orientation Level: Situation;Place(Pt reports no when asked.  reports his name is Travis Briggs. )             General Comments: Pt generally responding to questions with yes and no responses. Unsure of accuracy, given cognitive deficits. Pt resAsked if pt's name was Travis Briggs, and pt reports no. Pt could follow simple, one step mobility and ADL commands.       Exercises      General Comments        Pertinent Vitals/Pain Pain Assessment: Faces Faces Pain Scale: Hurts a little bit Pain Location: indicates RUE and RLE by pointing when asked Pain Descriptors / Indicators: Grimacing Pain Intervention(s): Monitored during session;Repositioned    Home Living Family/patient expects to be discharged to:: Unsure Living Arrangements: Alone Available Help at Discharge: Other (Comment)(unsure) Type of  Home: Apartment Home Access: Stairs to enter Entrance Stairs-Rails: Left;Right     Additional Comments: Unsure of accuracy of home information given cognitive deficits.     Prior Function Level of Independence: Independent      Comments: When asking pt if he used anything to walk with, pt stated "no". When asked if he had falls, pt reports yes.     PT Goals (current goals can now be found in the care plan  section) Acute Rehab PT Goals Patient Stated Goal: unable to state PT Goal Formulation: Patient unable to participate in goal setting Potential to Achieve Goals: Fair Progress towards PT goals: Progressing toward goals    Frequency    Min 3X/week      PT Plan Current plan remains appropriate    Co-evaluation PT/OT/SLP Co-Evaluation/Treatment: Yes Reason for Co-Treatment: Complexity of the patient's impairments (multi-system involvement);Necessary to address cognition/behavior during functional activity;To address functional/ADL transfers PT goals addressed during session: Mobility/safety with mobility OT goals addressed during session: ADL's and self-care      AM-PAC PT "6 Clicks" Mobility   Outcome Measure  Help needed turning from your back to your side while in a flat bed without using bedrails?: A Little Help needed moving from lying on your back to sitting on the side of a flat bed without using bedrails?: A Little Help needed moving to and from a bed to a chair (including a wheelchair)?: A Little Help needed standing up from a chair using your arms (e.g., wheelchair or bedside chair)?: A Little Help needed to walk in hospital room?: A Lot Help needed climbing 3-5 steps with a railing? : Total 6 Click Score: 15    End of Session Equipment Utilized During Treatment: Gait belt Activity Tolerance: Patient tolerated treatment well Patient left: in bed;with call bell/phone within reach;with bed alarm set Nurse Communication: Mobility status PT Visit Diagnosis: Unsteadiness on feet (R26.81);Other abnormalities of gait and mobility (R26.89);Muscle weakness (generalized) (M62.81);History of falling (Z91.81);Difficulty in walking, not elsewhere classified (R26.2)     Time: 1975-8832 PT Time Calculation (min) (ACUTE ONLY): 33 min  Charges:  $Gait Training: 8-22 mins                     Governor Rooks, PTA Acute Rehabilitation Services Pager [phone] Office  [phone]     Travis Briggs 02/05/2019, 2:13 PM

## 2019-02-05 NOTE — Procedures (Signed)
  [place] A. Merlene Laughter, MD     [email or web address]           HISTORY: This is a man who approximately is 43 year old presenting with altered mental status, seizures and unresponsiveness.  There apparently is a history of craniotomy.  There is significant scarring noted the FZ, C3 and FT3.  MEDICATIONS:  Current Facility-Administered Medications:  .  lactated ringers infusion, , Intravenous, Continuous, Elodia Florence., MD, Last Rate: 100 mL/hr at 02/05/19 0606 .  levETIRAcetam (KEPPRA) IVPB 1500 mg/ 100 mL premix, 1,500 mg, Intravenous, Q12H, Aroor, Karena Addison R, MD .  LORazepam (ATIVAN) injection 2 mg, 2 mg, Intravenous, Q2H PRN, Shela Leff, MD .  ondansetron (ZOFRAN) injection 4 mg, 4 mg, Intravenous, Q6H PRN, Shela Leff, MD, 4 mg at 02/04/19 1232 .  pantoprazole (PROTONIX) EC tablet 40 mg, 40 mg, Oral, Daily, Elodia Florence., MD, 40 mg at 02/05/19 1138 .  phenytoin (DILANTIN) injection 100 mg, 100 mg, Intravenous, Q8H, Rathore, Vasundhra, MD, 100 mg at 02/05/19 0612 .  senna-docusate (Senokot-S) tablet 1 tablet, 1 tablet, Oral, QHS PRN, Kerney Elbe, MD     ANALYSIS: A 16 channel recording using standard 10 20 measurements is conducted for 40mnutes.  The background activity is a well-formed on the right side showing a background activity of 10 Hz.  The left side shows moderate to severe slowing with a activity of 5 Hz maximum.  Sometimes there is a rhythmic slowing especially in the frontocentral region on the left side.  Photic stimulation and hyperventilation are not conducted.  There are multiple episodes of sharp wave activity at Fz and C3.  There is a single episode of rhythmic sharp wave and delta activity with the maximum at Fz, F3 and C3.  This is associated with changing frequency.   IMPRESSION: 1.  Moderate to severe left hemispheric slowing is observed.  This can be associated with cerebral disturbance and/or epileptic  focus. 2.  Multiple episodes of frontocentral epileptiform discharges. 3.  Single episode of electrographic seizures emanating from the left frontocentral region.      Quierra Silverio A. DMerlene Laughter M.D.  Diplomate, ATax adviserof Psychiatry and Neurology ( Neurology).

## 2019-02-05 NOTE — Progress Notes (Signed)
  Speech Language Pathology Treatment: Cognitive-Linquistic(Aphasia)  Patient Details Name: Travis Briggs MRN: [id number] DOB: 10/10/1876 Today's Date: 02/05/2019 Time: 6147-0929 SLP Time Calculation (min) (ACUTE ONLY): 28 min  Assessment / Plan / Recommendation Clinical Impression  Pt was seen for aphasia therapy and appears improved compared to that was was observed during the initial evaluation. He was able to produce some spontaneous automatic speech during the session such as, "I'm saying", "you did?", " I seen it", and "I should've got it". He was able to count from 1-10 with phonemic and orthographic cues but was only able to produce a single day despite max cues. He demonstrated 50% accuracy with simple yes/no questions but perseveration on "yes" was noted. He achieved 100% accuracy with picture identification from a field of three and identification of words from a field of two related to pictures. He achieved 20% accuracy with confrontational naming increasing to 90% accuracy with max cues. SLP will continue to follow pt.     HPI HPI: Travis Briggs is a 43 y.o. 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  EMS was called after bystanders observed patient having a seizure at a local bus stop.  When EMS arrived they noted him having tonic-clonic seizure activity.  Unfortunately upon arrival the person was called and EMS was no longer available.  Patient was given 5 mg of Versed IM.  In ED he was noted to have right hemiparesis, right gaze preference, and a right dense aphasia with inability to speak.  Patient was administered 1000 mg IV Keppra along with 1450 mg IV fosphenytoin.      SLP Plan  Continue with current plan of care       Recommendations  Diet recommendations: Dysphagia 3 (mechanical soft);Thin liquid Liquids provided via: Cup;Straw Medication  Administration: Whole meds with puree Supervision: Full supervision/cueing for compensatory strategies Compensations: Slow rate;Small sips/bites;Minimize environmental distractions Postural Changes and/or Swallow Maneuvers: Seated upright 90 degrees                Oral Care Recommendations: Oral care BID Follow up Recommendations: Skilled Nursing facility SLP Visit Diagnosis: Aphasia (R47.01) Plan: Continue with current plan of care       Travis Briggs I. Hardin Negus, [place], [place] Office number [phone] Pager [place] 02/05/2019, 4:01 PM

## 2019-02-05 NOTE — Progress Notes (Addendum)
NEUROLOGY PROGRESS NOTE  Subjective: Patient still having expressive and receptive aphasia along with right-sided weakness.  When asked questions I get inappropriate answers.  No excessive drowsiness and or agitation while on Keppra  Exam: Vitals:   02/05/19 0432 02/05/19 0700  BP: (!) 128/102 128/89  Pulse: 70 (!) 55  Resp: 20 20  Temp: 98.6 F (37 C) 98.5 F (36.9 C)  SpO2: 99% (!) 86%    Physical Exam   HEENT-  Normocephalic, no lesions, without obvious abnormality.  Normal external eye and conjunctiva.   Extremities- Warm, dry and intact Musculoskeletal-no joint tenderness, deformity or swelling Skin-warm and dry, no hyperpigmentation, vitiligo, or suspicious lesions    Neuro:  Mental Status: Patient is alert, unable to follow commands without visual cues and still having problems.  Continues to have expressive and receptive aphasia. Cranial Nerves: II: Able to count fingers and visual field III,IV, VI: ptosis not present, extra-ocular motions intact bilaterally pupils equal, round, reactive to light and accommodation V,VII: smile symmetric, facial light touch sensation normal bilaterally VIII: hearing normal bilaterally  Motor: Left upper and lower extremities are 5/5.  Patient has difficulty understanding what I am asking.  When I asked him to move his right arm he is able lift it off the bed and hold it without a drift.  However when asked to pull me toward him he continues to push me away.  He is able to lift his right leg off the bed. Sensory: Pinprick and light touch intact throughout, bilaterally Deep Tendon Reflexes: 2+ and symmetric throughout     Medications:  Scheduled: . pantoprazole  40 mg Oral Daily  . phenytoin (DILANTIN) IV  100 mg Intravenous Q8H   Continuous: . lactated ringers 100 mL/hr at 02/05/19 0606  . levETIRAcetam 1,000 mg (02/05/19 4098)    Pertinent Labs/Diagnostics: None   IMPRESSION: 1. 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 Encephalomalacia anterior frontal lobes bilaterally. Encephalomalacia left frontal lobe and left parietal lobe. Ventricular enlargement left greater than right due to chronic volume loss. Prior craniectomy and cranioplasty on the left. No acute intracranial abnormality. 2. Negative CTA head neck. No arterial stenosis or vascular malformation. 3. Cystic lesion involving the right mandible. The lesion is expansile with sclerotic margins medially. This is likely palpable. This is associated with right lower pre molar dental root. This is most likely a dental cyst which could be due to a radicular cyst or chronic infection. Electronically Signed   By: Franchot Gallo M.D.   On: 02/03/2019 18:36   Mr Brain Wo Contrast IMPRESSION: 1. Intermittently degraded by motion and discontinued prior to completion due to vomiting, agitation. 2. Bifrontal, left temporal and parietal encephalomalacia with no acute intracranial abnormality identified. Electronically Signed   By: Genevie Ann M.D.   On: 02/04/2019 02:40       Etta Quill PA-C [facility] [phone]   Assessment: 43 year old male with past medical history of traumatic brain injury status post craniotomy/cranioplasty on the left presenting with general tonic-clonic seizure.  Patient was given 1 g IV Keppra and 20 mg/kg of IV fosphenytoin.  While in hospital patient has had no further seizures.  Patient continues to have weakness on the right upper and lower extremities and also continues to have difficulty with speech.  Impression:  Status epilepticus- likely resolved, still concern for brief subclinical seizures Todd's paresis -improving Traumatic brain injury  Recommendations: Continue Keppra 1 g twice daily Continue Dilantin 100 mg 3 times daily-follow-up levels Awaiting  routine EEG that was obtained today Seizure precautions  Per [facility] statutes, patients with seizures are not allowed to drive until  they  have been seizure-free for six months. Use caution when using heavy equipment or power tools. Avoid working on ladders or at heights. Take showers instead of baths. Ensure the water temperature is not too high on the home water heater. Do not go swimming alone. When caring for infants or small children, sit down when holding, feeding, or changing them to minimize risk of injury to the child in the event you have a seizure.   Also, Maintain good sleep hygiene. Avoid alcohol.  If Yacob Wilkerson has another seizure, call 911 and bring them back to the ED if: A. The seizure lasts longer than 5 minutes.  B. The patient doesn't wake shortly after the seizure or has new problems such as difficulty seeing, speaking or moving following the seizure C. The patient was injured during the seizure D. The patient has a temperature over 102 F (39C) E. The patient vomited during the seizure and now is having trouble breathing   02/05/2019, 9:30 AM    NEUROHOSPITALIST ADDENDUM Performed a face to face diagnostic evaluation.   I have reviewed the contents of history and physical exam as documented by PA/ARNP/Resident and agree with above documentation.  I have discussed and formulated the above plan as documented. Edits to the note have been made as needed.   Patient still has receptive and expressive aphasia, as compared to yesterday on my examination.  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  EEG yesterday showed 1 brief seizure.  Will obtain 24 hour EEG as patient is not back to baseline and there is concern for brief subclinical seizures.  Continue Dilantin and Keppra for now.  Increase Keppra to 1500 mg twice daily.    This patient is neurologically critically ill due to multiple seizures without return to baseline concerning for subclinical seizure activity.  He is at risk for significant risk  of neurological worsening from worsening seizures, respiratory failure, aspiration and possible sudden death in epilepsy.  This patient's care requires constant monitoring of vital signs, hemodynamics, respiratory and cardiac monitoring, obtaining and reviewing EEG, discussing with epileptologist.     I spent 35  minutes of neurocritical time in the care of this patient.      Karena Addison Aroor MD [facility] [phone]   If 7pm to 7am, please call on call as listed on AMION.

## 2019-02-05 NOTE — Progress Notes (Addendum)
PROGRESS NOTE    Travis Briggs  [id number] DOB: 10/10/1876 DOA: 02/03/2019 PCP: No primary care provider on file.   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  It is unknown at this time whether patient has any additional past medical history.  They were called by an observer at [place] local bus stop informing them that the patient was having Travis Briggs seizure.  Upon arrival, EMS found the patient unattended having tonic-clonic seizure activity.  The individual who called EMS from the bus stop was no longer present on EMS arrival.  He was given 5 mg Versed IM.  In the ED, patient noted to have right hemiparesis, right gaze preference, and dense aphasia with inability to speak.  He had 2 more episodes of witnessed seizure activity in the ED.  Received Travis Briggs total of 4 mg Ativan, 1000 mg IV Keppra, and 1450 mg of IV fosphenytoin. Patient is very somnolent and nonverbal.  No history could be obtained from him.  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   MRI was motion degraded and incomplete, but notable for bifrontal, L temporal, and parietal encephalomalacia with no acute intracranial abnormality identified Neurology c/s, appreciate recs - continue IV keppra increase to 1500 mg BID.  Of note, he's also receiving phenytoin 100 mg TID. Follow dilantin levels  SLP eval Negative etoh level UDS pending   Aspiration precautions EEG with moderate to severe L hemispheric  slowing.  Multiple episodes of frontocentral epileptiform discharges.  Single episode of electrographic seizures emanating from the L frontocentral region (see report). Planning for 24 hour EEG given pt not back to baseline and concern for subclinical seizures UA pending   Acute Encephalopathy: due to above.  Will need to try to contact associates of pt to help with baseline functional status, etc.  There are 2 numbers listed in the chart.  Discussed with sister Travis Briggs.  She says at baseline, he has poor memory.  Will forget his name, birthday.  Supposed to wear Travis Briggs helmet -> will put nursing order for cranial helmet.  Word finding difficulty, aphasia. [id number] is his correct MRN Pt lives at [place] Per CM note silver alert out on pt per sister.  She's called and updated police.  Sister Travis Briggs can be reache at [phone].  Nausea   Vomiting: pt had 1 episode of dark brown emesis after working with speech.  Start PPI oral Low suspicion for GI bleed, but follow PM H/H KUB - nonobstructive pattern Hb stable, low suspicion for GI bleed Continue to monitor      Hiccups: reglan prn  Dental cyst CT showing cystic lesion involving the right mandible which is associated with the right lower premolar dental root.  This is thought to be most likely Travis Briggs dental cyst which could be due to Travis Briggs radicular cyst or chronic infection.  Patient is afebrile and does not have leukocytosis. -He will need dentistry follow-up.  DVT prophylaxis: SCD Code Status:  full  Family Communication: none at bedside - called sister [phone], Travis Briggs Disposition Plan: pending   Consultants:   neurology  Procedures:   none  Antimicrobials:  Anti-infectives (From admission, onward)   None     Subjective: Confused. Difficult to understand due to aphasia  Objective: Vitals:   02/05/19 0230 02/05/19 0432 02/05/19 0700 02/05/19 1235  BP: 118/84 (!) 128/102 128/89 139/85  Pulse:  70 (!) 55 (!) 51  Resp:  20  20 18   Temp:  98.6 F (37 C) 98.5 F (36.9 C) 98.4 F (36.9 C)  TempSrc:  Oral Axillary Axillary  SpO2:  99% (!) 86% 100%  Weight:      Height:        Intake/Output Summary (Last 24 hours) at 02/05/2019 1438 Last data filed at 02/05/2019 0400 Gross per 24 hour  Intake 2750.18 ml  Output 225 ml  Net 2525.18 ml   Filed Weights   02/03/19 1642  Weight: 72.6 kg    Examination:  General: No acute distress. Cardiovascular: Heart sounds show Travis Briggs regular rate, and rhythm. Lungs: Clear to auscultation bilaterally  Abdomen: Soft, nontender, nondistended Neurological: Alert, confused.  Aphasia.  Difficult to understand.   Does not consistently follow commands.  Improved R sided weakness. Skin: Warm and dry. No rashes or lesions. Extremities: No clubbing or cyanosis. No edema.     Data Reviewed: I have personally reviewed following labs and imaging studies  CBC: Recent Labs  Lab 02/03/19 1745 02/04/19 0814 02/04/19 1530 02/05/19 0407  WBC 6.5 8.0  --  8.1  NEUTROABS 4.5  --   --   --   HGB 13.7 13.2 12.7* 12.7*  HCT 42.4 40.3 39.7 38.6*  MCV 96.4 93.1  --  93.0  PLT 201 194  --  034   Basic Metabolic Panel: Recent Labs  Lab 02/03/19 1745 02/04/19 0814 02/04/19 1816 02/05/19 0407  NA 140 140 139  --   K 3.8 3.7 3.3*  --   CL 106 106 102  --   CO2 25 23 26   --   GLUCOSE 83 95 154*  --   BUN 15 9 7*  --   CREATININE 0.94 0.91 0.86  --   CALCIUM 9.4 8.6* 8.7*  --   MG  --   --   --  2.0   GFR: Estimated Creatinine Clearance: -3.4 mL/min (by C-G formula based on SCr of 0.86 mg/dL). Liver Function Tests: Recent Labs  Lab 02/03/19 1745 02/04/19 0814 02/04/19 1816  AST 22 21 20   ALT 31 29 29   ALKPHOS 89 79 84  BILITOT 0.6 0.6 0.5  PROT 8.1 6.6 6.5  ALBUMIN 4.9 3.9 3.9   No results for input(s): LIPASE, AMYLASE in the last 168 hours. No results for input(s): AMMONIA in the last 168 hours. Coagulation Profile: Recent Labs  Lab 02/03/19 1745  INR 1.0    Cardiac Enzymes: No results for input(s): CKTOTAL, CKMB, CKMBINDEX, TROPONINI in the last 168 hours. BNP (last 3 results) No results for input(s): PROBNP in the last 8760 hours. HbA1C: Recent Labs    02/05/19 0407  HGBA1C 5.3   CBG: Recent Labs  Lab 02/04/19 1248 02/04/19 1703 02/04/19 2346 02/05/19 0414 02/05/19 0728  GLUCAP 74 75 133* 102* 110*   Lipid Profile: Recent Labs    02/05/19 0407  CHOL 132  HDL 41  LDLCALC 66  TRIG 125  CHOLHDL 3.2   Thyroid Function Tests: No results for input(s): TSH, T4TOTAL,  FREET4, T3FREE, THYROIDAB in the last 72 hours. Anemia Panel: No results for input(s): VITAMINB12, FOLATE, FERRITIN, TIBC, IRON, RETICCTPCT in the last 72 hours. Sepsis Labs: No results for input(s): PROCALCITON, LATICACIDVEN in the last 168 hours.  Recent Results (from the past 240 hour(s))  SARS Coronavirus 2 [facility] order, Performed in [place] hospital lab)     Status: None   Collection Time: 02/03/19  7:46 PM  Result Value Ref Range Status   SARS Coronavirus 2 NEGATIVE NEGATIVE Final    Comment: (NOTE) If result is NEGATIVE SARS-CoV-2 target nucleic acids are NOT DETECTED. The SARS-CoV-2 RNA is generally detectable in upper and lower  respiratory specimens during the acute phase of infection. The lowest  concentration of SARS-CoV-2 viral copies this assay can detect is 250  copies / mL. Isham Smitherman negative result does not preclude SARS-CoV-2 infection  and should not be used as the sole basis for treatment or other  patient management decisions.  Carr Shartzer negative result may occur with  improper specimen collection / handling, submission of specimen other  than nasopharyngeal swab, presence of viral mutation(s) within the  areas targeted by this assay, and inadequate number of viral copies  (<250 copies / mL). Tyreese Thain negative result must be combined with clinical  observations, patient history, and epidemiological information. If result is POSITIVE SARS-CoV-2  target nucleic acids are DETECTED. The SARS-CoV-2 RNA is generally detectable in upper and lower  respiratory specimens dur ing the acute phase of infection.  Positive  results are indicative of active infection with SARS-CoV-2.  Clinical  correlation with patient history and other diagnostic information is  necessary to determine patient infection status.  Positive results do  not rule out bacterial infection or co-infection with other viruses. If result is PRESUMPTIVE POSTIVE SARS-CoV-2 nucleic acids MAY BE PRESENT.   Bernardino Dowell presumptive positive result was obtained on the submitted specimen  and confirmed on repeat testing.  While 2019 novel coronavirus  (SARS-CoV-2) nucleic acids may be present in the submitted sample  additional confirmatory testing may be necessary for epidemiological  and / or clinical management purposes  to differentiate between  SARS-CoV-2 and other Sarbecovirus currently known to infect humans.  If clinically indicated additional testing with an alternate test  methodology [phone]) is advised. The SARS-CoV-2 RNA is generally  detectable in upper and lower respiratory sp ecimens during the acute  phase of infection. The expected result is Negative. Fact Sheet for Patients:  StrictlyIdeas.no Fact Sheet for Healthcare Providers: BankingDealers.co.za This test is not yet approved or cleared by the [place] FDA and has been authorized for detection and/or diagnosis of SARS-CoV-2 by FDA under an Emergency Use Authorization (EUA).  This EUA will remain in effect (meaning this test can be used) for the duration of the COVID-19 declaration under Section 564(b)(1) of the Act, 21 U.S.C. section 360bbb-3(b)(1), unless the authorization is terminated or revoked sooner. Performed at [facility], [place] [address], [place], [place] [address]   MRSA PCR Screening     Status: None   Collection Time: 02/04/19  11:27 AM  Result Value Ref Range Status   MRSA by PCR NEGATIVE NEGATIVE Final    Comment:        The GeneXpert MRSA Assay (FDA approved for NASAL specimens only), is one component of Dante Roudebush comprehensive MRSA colonization surveillance program. It is not intended to diagnose MRSA infection nor to guide or monitor treatment for MRSA infections. Performed at [facility], [place]  [address], [place], [place] [address]          Radiology Studies: Ct Angio Head W Or Wo Contrast  Result Date: 02/03/2019 CLINICAL DATA:  Seizure EXAM: CT ANGIOGRAPHY HEAD AND NECK TECHNIQUE: Multidetector CT imaging of the head and neck was performed using the standard protocol during bolus administration of intravenous contrast. Multiplanar CT image reconstructions and MIPs were obtained to evaluate the vascular anatomy. Carotid stenosis measurements (when applicable) are obtained utilizing NASCET criteria, using the distal internal carotid diameter as the denominator. CONTRAST:  17m OMNIPAQUE IOHEXOL 350 MG/ML SOLN COMPARISON:  None. FINDINGS: CT HEAD FINDINGS Brain: Chronic encephalomalacia in the left cerebral hemisphere with areas of hypodensity in the left temporal lobe, anterior frontal lobe on the left, and left parietal lobe. Compensatory dilatation of the left lateral ventricle. Hypodensity also in the right anterior frontal lobe. Findings are most consistent with prior head trauma. Ventricles are mildly dilated diffusely however this may be baseline. Negative for acute hemorrhage.  No mass or acute infarct. Vascular: Negative for hyperdense vessel Skull: Extensive craniectomy and cranioplasty on the left. No acute skeletal abnormality. Sinuses: Mild mucosal edema in the paranasal sinuses. Orbits: No orbital lesion identified. Review of the MIP images confirms the above findings CTA NECK FINDINGS Aortic arch: Standard branching. Imaged portion shows no evidence of aneurysm or dissection. No significant  stenosis of the major arch vessel origins. Right carotid system: Normal right carotid system. No stenosis or irregularity Left carotid system: Normal left carotid system. No stenosis or irregularity. Vertebral arteries: Left vertebral artery dominant. Both vertebral arteries are relatively small but patent to the basilar. This is consistent with fetal origin of the posterior cerebral artery bilaterally. Skeleton: Cystic lesion of the mandible on the right. Well-defined sclerotic margins medially and bone thinning or destruction laterally. Internal cystic components bulge laterally and this is likely palpable. This may be associated with right lower pre molar tooth root. Floating molars in the mandible on the right with extensive caries. No fracture. Other neck: No soft tissue mass or adenopathy. Upper chest: Negative Review of the MIP images confirms the above findings CTA HEAD FINDINGS Anterior circulation: Cavernous carotid widely patent and normal bilaterally. Anterior and middle cerebral arteries widely patent bilaterally without stenosis or vascular malformation. Posterior circulation: Both vertebral arteries patent to the basilar. PICA patent bilaterally. Basilar widely patent. Superior cerebellar arteries widely patent. Fetal origin of the posterior cerebral arteries bilaterally which are widely patent. Venous sinuses: Normal venous enhancement. Anatomic variants: None Delayed phase: No enhancing lesions postcontrast administration Review of the MIP images confirms the above findings IMPRESSION: 1. 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 Encephalomalacia anterior frontal lobes bilaterally. Encephalomalacia left frontal lobe and left parietal lobe. Ventricular enlargement left greater than right due to chronic volume loss. Prior craniectomy and cranioplasty on the left. No acute intracranial abnormality. 2. Negative CTA head neck. No arterial stenosis or vascular malformation. 3. Cystic lesion  involving the right mandible. The lesion is expansile with sclerotic margins medially. This is likely palpable. This is associated with right lower pre molar dental root. This is most likely Tali Cleaves dental cyst which could be due to Jaimey Franchini radicular cyst or chronic infection. Electronically Signed   By: CFranchot GalloM.D.   On: 02/03/2019 18:36   Dg Abd 1 View  Result Date: 02/04/2019 CLINICAL DATA:  Male patient with nausea and vomiting EXAM: ABDOMEN - 1 VIEW COMPARISON:  02/04/2019 FINDINGS: Gas within stomach, small bowel, colon with no abnormal distention. No  unexpected radiopaque foreign body. No unexpected soft tissue density. No unexpected calcification. No displaced fracture. IMPRESSION: Nonobstructive bowel gas pattern. Electronically Signed   By: Corrie Mckusick D.O.   On: 02/04/2019 21:24   Ct Angio Neck W And/or Wo Contrast  Result Date: 02/03/2019 CLINICAL DATA:  Seizure EXAM: CT ANGIOGRAPHY HEAD AND NECK TECHNIQUE: Multidetector CT imaging of the head and neck was performed using the standard protocol during bolus administration of intravenous contrast. Multiplanar CT image reconstructions and MIPs were obtained to evaluate the vascular anatomy. Carotid stenosis measurements (when applicable) are obtained utilizing NASCET criteria, using the distal internal carotid diameter as the denominator. CONTRAST:  40m OMNIPAQUE IOHEXOL 350 MG/ML SOLN COMPARISON:  None. FINDINGS: CT HEAD FINDINGS Brain: Chronic encephalomalacia in the left cerebral hemisphere with areas of hypodensity in the left temporal lobe, anterior frontal lobe on the left, and left parietal lobe. Compensatory dilatation of the left lateral ventricle. Hypodensity also in the right anterior frontal lobe. Findings are most consistent with prior head trauma. Ventricles are mildly dilated diffusely however this may be baseline. Negative for acute hemorrhage.  No mass or acute infarct. Vascular: Negative for hyperdense vessel Skull: Extensive  craniectomy and cranioplasty on the left. No acute skeletal abnormality. Sinuses: Mild mucosal edema in the paranasal sinuses. Orbits: No orbital lesion identified. Review of the MIP images confirms the above findings CTA NECK FINDINGS Aortic arch: Standard branching. Imaged portion shows no evidence of aneurysm or dissection. No significant stenosis of the major arch vessel origins. Right carotid system: Normal right carotid system. No stenosis or irregularity Left carotid system: Normal left carotid system. No stenosis or irregularity. Vertebral arteries: Left vertebral artery dominant. Both vertebral arteries are relatively small but patent to the basilar. This is consistent with fetal origin of the posterior cerebral artery bilaterally. Skeleton: Cystic lesion of the mandible on the right. Well-defined sclerotic margins medially and bone thinning or destruction laterally. Internal cystic components bulge laterally and this is likely palpable. This may be associated with right lower pre molar tooth root. Floating molars in the mandible on the right with extensive caries. No fracture. Other neck: No soft tissue mass or adenopathy. Upper chest: Negative Review of the MIP images confirms the above findings CTA HEAD FINDINGS Anterior circulation: Cavernous carotid widely patent and normal bilaterally. Anterior and middle cerebral arteries widely patent bilaterally without stenosis or vascular malformation. Posterior circulation: Both vertebral arteries patent to the basilar. PICA patent bilaterally. Basilar widely patent. Superior cerebellar arteries widely patent. Fetal origin of the posterior cerebral arteries bilaterally which are widely patent. Venous sinuses: Normal venous enhancement. Anatomic variants: None Delayed phase: No enhancing lesions postcontrast administration Review of the MIP images confirms the above findings IMPRESSION: 1. 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 Encephalomalacia  anterior frontal lobes bilaterally. Encephalomalacia left frontal lobe and left parietal lobe. Ventricular enlargement left greater than right due to chronic volume loss. Prior craniectomy and cranioplasty on the left. No acute intracranial abnormality. 2. Negative CTA head neck. No arterial stenosis or vascular malformation. 3. Cystic lesion involving the right mandible. The lesion is expansile with sclerotic margins medially. This is likely palpable. This is associated with right lower pre molar dental root. This is most likely Ilka Lovick dental cyst which could be due to Grainger Mccarley radicular cyst or chronic infection. Electronically Signed   By: CFranchot GalloM.D.   On: 02/03/2019 18:36   Mr Brain Wo Contrast  Result Date: 02/04/2019 CLINICAL DATA:  [name] with  tonic clonic seizure. History of prior traumatic brain injury, head CT and CTA head and neck earlier tonight at [place]: MRI HEAD WITHOUT CONTRAST TECHNIQUE: Multiplanar, multiecho pulse sequences of the brain and surrounding structures were obtained without intravenous contrast. COMPARISON:  [facility] head CT and CTA earlier tonight. FINDINGS: The examination had to be discontinued prior to completion due to vomiting, agitation. Coronal T2 and axial T1 weighted imaging not obtained. Axial T2, FLAIR and SWI degraded by motion. Brain: Ex vacuo ventricular enlargement. No intracranial mass effect or midline shift. No restricted diffusion or evidence of acute infarction. No diffusion abnormality in the hippocampal formations. Anterior bifrontal encephalomalacia. Fairly extensive left temporal lobe encephalomalacia. Left parietal lobe encephalomalacia. SWI suggests some chronic hemosiderin along the left convexity but is substantially motion degraded. Brainstem volume loss. Cerebellum appears relatively preserved. Punctate nonspecific T2 hyperintensity at the right globus pallidus. Negative pituitary and cervicomedullary junction.  Vascular: Major intracranial vascular flow voids appear preserved. Skull and upper cervical spine: Negative visible cervical spine. Prior craniotomy. Visualized bone marrow signal is within normal limits. Sinuses/Orbits: Negative orbits. Paranasal sinus mucosal thickening and left maxillary retention cysts. Other: Mastoids are clear.  Scalp soft tissue scarring. IMPRESSION: 1. Intermittently degraded by motion and discontinued prior to completion due to vomiting, agitation. 2. Bifrontal, left temporal and parietal encephalomalacia with no acute intracranial abnormality identified. Electronically Signed   By: Genevie Ann M.D.   On: 02/04/2019 02:40   Ct C-spine No Charge  Result Date: 02/03/2019 CLINICAL DATA:  Seizure.  Neck trauma EXAM: CT CERVICAL SPINE WITHOUT CONTRAST TECHNIQUE: Multidetector CT imaging of the cervical spine was performed without intravenous contrast. Multiplanar CT image reconstructions were also generated. COMPARISON:  None. FINDINGS: Alignment: Normal Skull base and vertebrae: Negative for fracture Soft tissues and spinal canal: Negative Disc levels:  No significant degenerative change Upper chest: Negative Other: None IMPRESSION: Negative CT cervical spine. Electronically Signed   By: Franchot Gallo M.D.   On: 02/03/2019 18:41   Dg Chest Port 1 View  Result Date: 02/04/2019 CLINICAL DATA:  [name] with seizure. History of prior traumatic brain injury, head CT CTA head and neck earlier tonight at [place]: PORTABLE CHEST 1 VIEW COMPARISON:  Abdomen radiographs reported separately. FINDINGS: Portable AP upright view at 0137 hours. Low normal lung volumes. Normal cardiac size and mediastinal contours. Visualized tracheal air column is within normal limits. Chronic lateral left sixth through 8th rib fractures. Allowing for portable technique the lungs are clear. Negative visible bowel gas pattern. No acute osseous abnormality identified. IMPRESSION: 1.  No acute  cardiopulmonary abnormality. 2. Chronic left lateral rib fractures. Electronically Signed   By: Genevie Ann M.D.   On: 02/04/2019 02:09   Dg Abd 2 Views  Result Date: 02/04/2019 CLINICAL DATA:  [name] with seizure. History of prior traumatic brain injury, head CT CTA head and neck earlier tonight at [place]: ABDOMEN - 2 VIEW COMPARISON:  None. FINDINGS: Upright and supine views. Excreted IV contrast in the urinary bladder. No pneumoperitoneum. Non obstructed bowel gas pattern. Chronic appearing posterior left 6th through 8th rib fractures. No acute osseous abnormality identified. Normal mediastinal contour. Negative visible lungs. IMPRESSION: 1. Normal bowel gas pattern, no free air. 2. Excreted IV contrast in the urinary bladder from CTA earlier tonight at [facility]. Electronically Signed   By: Genevie Ann M.D.   On: 02/04/2019 02:08  Scheduled Meds:  pantoprazole  40 mg Oral Daily   phenytoin (DILANTIN) IV  100 mg Intravenous Q8H   Continuous Infusions:  lactated ringers 100 mL/hr at 02/05/19 0606   [id number]       LOS: 1 day    Time spent: over 30 min    Fayrene Helper, MD [facility] PagerAMION  If 7PM-7AM, please contact night-coverage [email or web address] Password [facility] 02/05/2019, 2:38 PM

## 2019-02-05 NOTE — TOC Initial Note (Signed)
Transition of Care [facility]) - Initial/Assessment Note    Patient Details  Name: Travis Briggs MRN: [id number] Date of Birth: 10/10/1876  Transition of Care [facility] [place]) CM/SW Contact:    Pollie Friar, RN Phone Number: 02/05/2019, 3:26 PM  Clinical Narrative:                 CM called the local number found in the patients belongings. It went to an [place] who is a friend of the patient. She states he lives at [facility]. She states he has a family member out in the [facility] area but she doesn't have the information on her.  She states the patient called her a couple days ago and asked her to pick him up he wanted to leave and she told he she couldn't do that.  CSW updated and is going to reach out to [employer].  CSW found that patient has an old medical record at [facility] and a sister: [name] is his next of kin: [phone]. CM called her and informed her that the patient is at [facility]. She states there is a Silver Alert out on him. She is going to call the police and let them know where he is located. She also is going to reach out to [employer]. MD updated.   Expected Discharge Plan: Assisted Living Barriers to Discharge: Continued Medical Work up, Other (comment)(pt initially doesnt know identity--old TBI)   Patient Goals and CMS Choice        Expected Discharge Plan and Services Expected Discharge Plan: Assisted Living       Living arrangements for the past 2 months: [place]                                      Prior Living Arrangements/Services Living arrangements for the past 2 months: [place]   Patient language and need for interpreter reviewed:: Yes(no needs)              Criminal Activity/Legal Involvement Pertinent to Current Situation/Hospitalization: No - Comment as needed  Activities of Daily Living Home Assistive Devices/Equipment: None    Permission Sought/Granted                  Emotional  Assessment           Psych Involvement: No (comment)  Admission diagnosis:  Aphasia [R47.01] Seizure ([place]) [R56.9] Trauma [T14.90XA] Right sided weakness [R53.1] Patient Active Problem List   Diagnosis Date Noted  . Status epilepticus ([place]) 02/04/2019  . Dental cyst 02/04/2019  . 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 Pharmacy:  No Pharmacies Listed    Social Determinants of Health (SDOH) Interventions    Readmission Risk Interventions No flowsheet data found.

## 2019-02-05 NOTE — Progress Notes (Signed)
vLTM EEG started No skin breakdown. Notified Neuro.

## 2019-02-06 DIAGNOSIS — K048 Radicular cyst: Secondary | ICD-10-CM

## 2019-02-06 DIAGNOSIS — R531 Weakness: Secondary | ICD-10-CM

## 2019-02-06 LAB — CBC
HCT: 40.8 % (ref 39.0–52.0)
Hemoglobin: 13.5 g/dL (ref 13.0–17.0)
MCH: 30.3 pg (ref 26.0–34.0)
MCHC: 33.1 g/dL (ref 30.0–36.0)
MCV: 91.5 fL (ref 80.0–100.0)
Platelets: 180 10*3/uL (ref 150–400)
RBC: 4.46 MIL/uL (ref 4.22–5.81)
RDW: 12 % (ref 11.5–15.5)
WBC: 6.5 10*3/uL (ref 4.0–10.5)
nRBC: 0 % (ref 0.0–0.2)

## 2019-02-06 LAB — GLUCOSE, CAPILLARY
Glucose-Capillary: 112 mg/dL — ABNORMAL HIGH (ref 70–99)
Glucose-Capillary: 102 mg/dL — ABNORMAL HIGH (ref 70–99)
Glucose-Capillary: 132 mg/dL — ABNORMAL HIGH (ref 70–99)
Glucose-Capillary: 92 mg/dL (ref 70–99)
Glucose-Capillary: 97 mg/dL (ref 70–99)

## 2019-02-06 LAB — MAGNESIUM: Magnesium: 1.8 mg/dL (ref 1.7–2.4)

## 2019-02-06 MED ORDER — TOPIRAMATE 100 MG PO TABS
200.0000 mg | ORAL_TABLET | Freq: Two times a day (BID) | ORAL | Status: DC
  Administered 2019-02-06 – 2019-02-22 (×33): 200 mg via ORAL
  Filled 2019-02-06 (×33): qty 2

## 2019-02-06 MED ORDER — SODIUM CHLORIDE 0.9 % IV SOLN
200.0000 mg | Freq: Two times a day (BID) | INTRAVENOUS | Status: DC
  Administered 2019-02-06 – 2019-02-14 (×18): 200 mg via INTRAVENOUS
  Filled 2019-02-06 (×21): qty 20

## 2019-02-06 NOTE — Plan of Care (Signed)
  Problem: Clinical Measurements: Goal: Complications related to the disease process, condition or treatment will be avoided or minimized Outcome: Progressing   Problem: Medication: Goal: Risk for medication side effects will decrease Outcome: Progressing

## 2019-02-06 NOTE — Procedures (Signed)
CPT/Type of Study: 10315; 24hr EEG with video Recording Date: 02/05/2019 15:01 - 02/06/2019 07:30  Interpreting physician: Izora Ribas, DO  History: This is a 43 year old patient with history of TBI presenting with seizures, undergoing an EEG to evaluate for seizures.   Technical Description: The EEG was performed using standard setting per the guidelines of American Clinical Neurophysiology Society (ACNS).   A minimum of 21 electrodes were placed on scalp according to the International 10-20 or/and 10-10 Systems. Supplemental electrodes were placed as needed. Single EKG electrode was also used to detect cardiac arrhythmia. Patient's behavior was continuously recorded on video simultaneously with EEG. A minimum of 16 channels were used for data display. Each epoch of study was reviewed manually daily and as needed using standard referential and bipolar montages. Computerized quantitative EEG analysis (such as compressed spectral array analysis, trending, automated spike & seizure detection) were used as indicated.   Clinical State: Lethargy Background: The posterior dominant rhythm was recorded up to 8Hz  on the right  Overall Amplitude: Normal Predominant Frequency: Delta on the left  Asymmetry: Yes Sleep background: Normal sleep architecture was not seen  Abnormalities: Continuous slow lateralized left  Rhythmic or periodic pattern: None  Epileptiform activity: Yes frequent sharp waves left fronto-central maximal Fz, F3  Electrographic Seizure: Yes frequent seizures were recorded from the left frontal region lasting on average 1-2 minutes with no apparent clinical signs on video. Frequency was on average 4/hour until 21:00 when it decreased to 2/hour. Average 1/hour after 23:00.  Events: Yes, the event button was pushed several times for unclear reasons. These did not correlate with electrographic seizures.   Breach rhythm: Yes  Reactivity: Yes  Stimulation procedures:   Hyperventilation: Not done Photic stimulation: Not done  Impression: This EEG shows evidence of cerebral dysfunction in the left frontal region with epileptogenicity. Frequent seizures were recorded from the left frontal region lasting on average 1-2 minutes with no apparent clinical signs on video. The event button pushes did not correlate with electrographic seizures.

## 2019-02-06 NOTE — Progress Notes (Signed)
  Speech Language Pathology Treatment: Cognitive-Linquistic(Aphasia)  Patient Details Name: Byrne Capek MRN: [id number] DOB: 10/10/1876 Today's Date: 02/06/2019 Time: 2683-4196 SLP Time Calculation (min) (ACUTE ONLY): 39 min  Assessment / Plan / Recommendation Clinical Impression  With the pt's permission, his sister, [name], was contacted via phone. She reported that the pt's language has been impaired since the TBI of May, 2018. and he has been having speech therapy since then. However, following communication with the pt, she stated that the pt's language skills were previously not as impaired as they currently are. She cited increased difficulty with word retrieval, sentence formulation, and perseveration on phrases. He required phonemic cues to produce automatic sequences (i.e., days of the week and counting). He achieved 25% accuracy with simple phrase completion when mod cues were provided. He demonstrated 20% accuracy with confrontational naming increasing to 60% accuracy with max cues. SLP will continue to follow pt.    HPI HPI: Juwon Scripter is a 43 y.o. 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 EMS was called after bystanders observed patient having a seizure at a local bus stop. When EMS arrived they noted him having tonic-clonic seizure activity. Patient was given 5 mg of Versed IM. In ED he was noted to have right hemiparesis, right gaze preference, and a right dense aphasia with inability to speak.  MRI of the brain revealed bifrontal, left temporal and parietal encephalomalacia with no acute intracranial abnormality identified. EEG was performed and showed evidence of cerebral dysfunction in theleft frontal region with epileptogenicity.Frequent seizures were recorded from the left frontal region lasting on average 1-2 minutes with no apparent clinical  signs on video.       SLP Plan  Continue with current plan of care       Recommendations  Diet recommendations: Dysphagia 3 (mechanical soft) Liquids provided via: Cup;Straw Medication Administration: Whole meds with puree Supervision: Full supervision/cueing for compensatory strategies Compensations: Slow rate;Small sips/bites;Minimize environmental distractions Postural Changes and/or Swallow Maneuvers: Seated upright 90 degrees                Oral Care Recommendations: Oral care BID Follow up Recommendations: Skilled Nursing facility SLP Visit Diagnosis: Aphasia (R47.01) Plan: Continue with current plan of care       Khup Sapia I. Hardin Negus, [place], [place] Office number [phone] Pager [place] 02/06/2019, 3:24 PM

## 2019-02-06 NOTE — Progress Notes (Signed)
maint complete.

## 2019-02-06 NOTE — Progress Notes (Addendum)
NEUROLOGY PROGRESS NOTE  Subjective: No complaints> no further seizures   Exam: Vitals:   02/06/19 0344 02/06/19 0732  BP: (!) 155/102 (!) 140/92  Pulse: 67 (!) 54  Resp: 18 18  Temp: 98.4 F (36.9 C) 98.9 F (37.2 C)  SpO2: 100% 100%    Physical Exam   HEENT-  Normocephalic, no lesions, without obvious abnormality.  Normal external eye and conjunctiva.   Extremities- Warm, dry and intact Musculoskeletal-no joint tenderness, deformity or swelling Skin-warm and dry, no hyperpigmentation, vitiligo, or suspicious lesions    Neuro:  Mental Status: Patient is definitely more alert today, when asked where he is he clearly looks at the sign and tries to say [place], when asked what the year is he clearly is trying to say 2020.  He does have expressive aphasia but is more talkative and more alert today Cranial Nerves: II: Able to hold up number fingers I am showing him III,IV, VI: ptosis not present, extra-ocular motions intact bilaterally pupils equal, round, reactive to light and accommodation V,VII: smile symmetric, facial light touch sensation normal bilaterally VIII: hearing normal bilaterally  Motor: Left upper and lower extremity 5/5.  Right upper extremity and lower extremity is stronger today with 5/5 when I can get him to understand what I am asking him to do Tone and bulk:normal tone throughout; no atrophy noted Sensory: Pinprick and light touch intact throughout, bilaterally Deep Tendon Reflexes: 2+ and symmetric throughout     Medications:  Scheduled: . pantoprazole  40 mg Oral Daily  . phenytoin (DILANTIN) IV  100 mg Intravenous Q8H  . topiramate  200 mg Oral BID   Continuous: . lacosamide (VIMPAT) IV 200 mg (02/06/19 1053)  . levETIRAcetam 1,500 mg (02/06/19 0636)    Pertinent Labs/Diagnostics: EEG: This EEG shows evidence of cerebral dysfunction in the left frontal region with epileptogenicity. Frequent seizures were recorded from the left frontal  region lasting on average 1-2 minutes with no apparent clinical signs on video. The event button pushes did not correlate with electrographic seizures.  Dg Abd 1 View  Result Date: 02/04/2019 CLINICAL DATA:  Male patient with nausea and vomiting EXAM: ABDOMEN - 1 VIEW COMPARISON:  02/04/2019 FINDINGS: Gas within stomach, small bowel, colon with no abnormal distention. No unexpected radiopaque foreign body. No unexpected soft tissue density. No unexpected calcification. No displaced fracture. IMPRESSION: Nonobstructive bowel gas pattern. Electronically Signed   By: Corrie Mckusick D.O.   On: 02/04/2019 21:24     Etta Quill PA-C [facility] [phone]   NEUROHOSPITALIST ADDENDUM Performed a face to face diagnostic evaluation.   I have reviewed the contents of history and physical exam as documented by PA/ARNP/Resident and agree with above documentation.  I have discussed and formulated the above plan as documented. Edits to the note have been made as needed.  43 year old male with past medical history of traumatic brain injury status post craniotomy/cranioplasty on the left presenting with general tonic-clonic seizure.  Patient was given 1 g IV Keppra and 20 mg/kg of IV fosphenytoin.  While in hospital patient has had no further clinical seizures.  Patient continued to have some weakness on the right upper and lower extremities and also continues to have difficulty with speech.   As patient still aphasic and routine EEG showed 1 brief seizure, patient was connected to long-term EEG monitoring yesterday. The patient was having about 4 seizures per hour, initially-that is subsequently improved after adding Vimpat.  This morning he was averaging about 1 seizure  per hour.   Patient is much more awake, speech is improved and was having lunch at the time of my assessment. Oral Topamax was added.     Impression:  Status epilepticus- resolved but still has frequent subclinical  seizures, improving Todd's paresis resolved  Traumatic brain injury  Recommendations: Continue LTM EEG monitoring  Continue Keppra 1 g twice daily Continue Dilantin 100 mg 3 times daily-level yesterday was 20.2 Continue Vimpat 216m BID Started Topamax 2031mBID Seizure precautions  This patient is neurologically critically ill due to multiple seizures without return to baseline concerning for subclinical seizure activity.  He is at risk for significant risk of neurological worsening from worsening seizures, respiratory failure, aspiration and possible sudden death in epilepsy.  This patient's care requires constant monitoring of vital signs, hemodynamics, respiratory and cardiac monitoring, obtaining and reviewing EEG, discussing with epileptologist.   I spent 35 minutes of critical care time with this patient independent of time spent by PA.    SuKarena Addisonroor MD [facility] [phone] If 7pm to 7am, please call on call as listed on AMION.

## 2019-02-06 NOTE — Progress Notes (Signed)
PROGRESS NOTE    Travis Briggs  [id number] DOB: 10/10/1876 DOA: 02/03/2019 PCP: No primary care provider on file.   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  It is unknown at this time whether patient has any additional past medical history.  They were called by an observer at a local bus stop informing them that the patient was having a seizure.  Upon arrival, EMS found the patient unattended having tonic-clonic seizure activity.  The individual who called EMS from the bus stop was no longer present on EMS arrival.  He was given 5 mg Versed IM.  In the ED, patient noted to have right hemiparesis, right gaze preference, and dense aphasia with inability to speak.  He had 2 more episodes of witnessed seizure activity in the ED.  Received a total of 4 mg Ativan, 1000 mg IV Keppra, and 1450 mg of IV fosphenytoin. Patient is very somnolent and nonverbal.  No history could be obtained from him.     Subjective: 4/29 A/O x1 (does not know where, when, why)  follows commands.  Positive perseveration   Assessment & Plan:   Principal Problem:   Status epilepticus ([place]) Active Problems:   Dental cyst   Stroke (cerebrum) ([facility])  Status epilepticus -Abnormal EEG see results below -Multiple seizures prior to presentation: Residual RIGHT sided weakness (Todd's paralysis?) -Lacosamide 200 mg twice daily - Keppra 1500 mg twice daily - Ativan 2 mg PRN - Phenytoin 100 mg every 8 hours - Topiramate 200 mg twice daily   Hx TBI -S/p craniectomy and cranioplasty -Unsure of patient's baseline  Acute encephalopathy - Unsure of patient's baseline - Per chart at admission patient was discussed with his Sister [name] at baseline has poor memory will forget his name birthday.  Has word finding  difficulty, aphasia.  Is supposed to wear helmet - will need to order cranial helmet -Sister [name] can be reache at [phone].   Nausea/vomiting -Resolved      Hiccups:  -Reglan prn   Dental cyst -CT showing cystic lesion involving the right mandible which is associated with the right lower premolar dental root.  This is thought to be most likely a dental cyst which could be due to a radicular cyst or chronic infection.  Patient is afebrile and does not have leukocytosis. -He will need dentistry follow-up.    DVT prophylaxis: SCD (low platelets) Code Status: Full Family Communication: None Disposition Plan: TBD   Consultants:  Neurology    Procedures/Significant Events:  4/28 JOI:NOMVEHMC to severe left hemispheric slowing is observed.  This can be associated with cerebral disturbance and/or epileptic focus. 2.  Multiple episodes of frontocentral epileptiform discharges. 3.  Single episode of electrographic seizures emanating from the left frontocentral region.  4/29 24-hour NOB:SJGGEZMO dysfunction in the left frontal region with epileptogenicity. Frequent seizures were recorded from the left frontal region lasting on average 1-2 minutes with no apparent clinical signs on video. The event button pushes did not correlate with electrographic seizures.    I have personally reviewed and interpreted all radiology studies and my findings are as above.  VENTILATOR SETTINGS:    Cultures   Antimicrobials: Anti-infectives (From admission, onward)   None       Devices    LINES / TUBES:  Continuous Infusions: . lacosamide (VIMPAT) IV    . levETIRAcetam 1,500 mg (02/06/19 0636)     Objective: Vitals:   02/05/19 1952 02/05/19 2355 02/06/19 0344 02/06/19 0732  BP: (!) 136/106 (!) 143/95 (!) 155/102 (!) 140/92  Pulse: (!) 55 68 67 (!) 54  Resp: 17 18 18 18   Temp: 98.8 F (37.1 C) 98.4 F (36.9 C) 98.4 F (36.9 C) 98.9 F (37.2 C)  TempSrc: Oral Oral  Oral Oral  SpO2: 100% 100% 100% 100%  Weight:      Height:        Intake/Output Summary (Last 24 hours) at 02/06/2019 0831 Last data filed at 02/06/2019 1478 Gross per 24 hour  Intake 1390 ml  Output 960 ml  Net 430 ml   Filed Weights   02/03/19 1642  Weight: 72.6 kg    Examination:  General: A/O x1 (does not know where, when, why) no acute respiratory distress Eyes: negative scleral hemorrhage, negative anisocoria, negative icterus ENT: Negative Runny nose, negative gingival bleeding, Neck:  Negative scars, masses, torticollis, lymphadenopathy, JVD Lungs: Clear to auscultation bilaterally without wheezes or crackles Cardiovascular: Regular rate and rhythm without murmur gallop or rub normal S1 and S2 Abdomen: negative abdominal pain, nondistended, positive soft, bowel sounds, no rebound, no ascites, no appreciable mass Extremities: No significant cyanosis, clubbing, or edema bilateral lower extremities Skin: Negative rashes, lesions, ulcers Psychiatric: Unable to evaluate secondary to patient's altered mental status.   Central nervous system:  Cranial nerves II through XII intact, tongue/uvula midline, all extremities muscle strength 5/5, sensation intact throughout, finger nose finger bilateral patient able to accomplish task but very slowly.  quick finger touch bilateral unable to accomplish tasks.  negative dysarthria, positive expressive aphasia, negative receptive aphasia, positive perseveration.  .     Data Reviewed: Care during the described time interval was provided by me .  I have reviewed this patient's available data, including medical history, events of note, physical examination, and all test results as part of my evaluation.   CBC: Recent Labs  Lab 02/03/19 1745 02/04/19 0814 02/04/19 1530 02/05/19 0407 02/06/19 0452  WBC 6.5 8.0  --  8.1 6.5  NEUTROABS 4.5  --   --   --   --   HGB 13.7 13.2 12.7* 12.7* 13.5  HCT 42.4 40.3 39.7 38.6* 40.8  MCV 96.4 93.1   --  93.0 91.5  PLT 201 194  --  194 295   Basic Metabolic Panel: Recent Labs  Lab 02/03/19 1745 02/04/19 0814 02/04/19 1816 02/05/19 0407 02/05/19 1502 02/06/19 0452  NA 140 140 139  --  140 138  K 3.8 3.7 3.3*  --  3.4* 3.6  CL 106 106 102  --  104 103  CO2 25 23 26   --  22 26  GLUCOSE 83 95 154*  --  151* 106*  BUN 15 9 7*  --  <5* 5*  CREATININE 0.94 0.91 0.86  --  0.80 0.69  CALCIUM 9.4 8.6* 8.7*  --  9.1 9.1  MG  --   --   --  2.0  --  1.8   GFR: Estimated Creatinine Clearance: -3.7 mL/min (by C-G formula based on SCr of 0.69 mg/dL). Liver Function Tests: Recent Labs  Lab 02/03/19 1745 02/04/19 0814 02/04/19 1816 02/05/19 1502 02/06/19 0452  AST 22 21 20 19 19   ALT 31 29 29 25 29   ALKPHOS 89 79 84 80 84  BILITOT 0.6 0.6 0.5 0.3 0.6  PROT 8.1 6.6 6.5 6.3* 6.5  ALBUMIN 4.9 3.9 3.9 3.7 3.7   No results for input(s): LIPASE, AMYLASE in the last 168 hours. No results for input(s): AMMONIA in the last 168 hours. Coagulation Profile: Recent Labs  Lab 02/03/19 1745  INR 1.0   Cardiac Enzymes: No results for input(s): CKTOTAL, CKMB, CKMBINDEX, TROPONINI in the last 168 hours. BNP (last 3 results) No results for input(s): PROBNP in the last 8760 hours. HbA1C: Recent Labs    02/05/19 0407  HGBA1C 5.3   CBG: Recent Labs  Lab 02/05/19 0414 02/05/19 0728 02/05/19 2356 02/06/19 0348 02/06/19 0735  GLUCAP 102* 110* 97 112* 102*   Lipid Profile: Recent Labs    02/05/19 0407  CHOL 132  HDL 41  LDLCALC 66  TRIG 125  CHOLHDL 3.2   Thyroid Function Tests: No results for input(s): TSH, T4TOTAL, FREET4, T3FREE, THYROIDAB in the last 72 hours. Anemia Panel: No results for input(s): VITAMINB12, FOLATE, FERRITIN, TIBC, IRON, RETICCTPCT in the last 72 hours. Urine analysis: No results found for: COLORURINE, APPEARANCEUR, LABSPEC, PHURINE, GLUCOSEU, HGBUR, BILIRUBINUR, KETONESUR, PROTEINUR, UROBILINOGEN, NITRITE, LEUKOCYTESUR Sepsis Labs:  @[email or web address] (procalcitonin:4,lacticidven:4)  ) Recent Results (from the past 240 hour(s))  SARS Coronavirus 2 [facility] order, Performed in [place] hospital lab)     Status: None   Collection Time: 02/03/19  7:46 PM  Result Value Ref Range Status   SARS Coronavirus 2 NEGATIVE NEGATIVE Final    Comment: (NOTE) If result is NEGATIVE SARS-CoV-2 target nucleic acids are NOT DETECTED. The SARS-CoV-2 RNA is generally detectable in upper and lower  respiratory specimens during the acute phase of infection. The lowest  concentration of SARS-CoV-2 viral copies this assay can detect is 250  copies / mL. A negative result does not preclude SARS-CoV-2 infection  and should not be used as the sole basis for treatment or other  patient management decisions.  A negative result may occur with  improper specimen collection / handling, submission of specimen other  than nasopharyngeal swab, presence of viral mutation(s) within the  areas targeted by this assay, and inadequate number of viral copies  (<250 copies / mL). A negative result must be combined with clinical  observations, patient history, and epidemiological information. If result is POSITIVE SARS-CoV-2 target nucleic acids are DETECTED. The SARS-CoV-2 RNA is generally detectable in upper and lower  respiratory specimens dur ing the acute phase of infection.  Positive  results are indicative of active infection with SARS-CoV-2.  Clinical  correlation with patient history and other diagnostic information is  necessary to determine patient infection status.  Positive results do  not rule out bacterial infection or co-infection with other viruses. If result is PRESUMPTIVE POSTIVE SARS-CoV-2 nucleic acids MAY BE PRESENT.   A presumptive positive result was obtained on the submitted specimen  and confirmed on repeat testing.  While 2019 novel coronavirus  (SARS-CoV-2) nucleic acids may be present in the submitted sample  additional  confirmatory testing may be necessary for epidemiological  and / or clinical management purposes  to differentiate between  SARS-CoV-2 and other Sarbecovirus currently known to infect humans.  If clinically indicated additional testing with an alternate test  methodology [phone]) is advised. The SARS-CoV-2 RNA is generally  detectable in upper and lower respiratory sp ecimens during the acute  phase of infection. The expected result is Negative. Fact Sheet for Patients:  StrictlyIdeas.no Fact Sheet for Healthcare Providers: BankingDealers.co.za This test is not yet approved or cleared by the [place]  FDA and has been authorized for detection and/or diagnosis of SARS-CoV-2 by FDA under an Emergency Use Authorization (EUA).  This EUA will remain in effect (meaning this test can be used) for the duration of the COVID-19 declaration under Section 564(b)(1) of the Act, 21 U.S.C. section 360bbb-3(b)(1), unless the authorization is terminated or revoked sooner. Performed at [facility], [place] [address], [place], [place] [address]   MRSA PCR Screening     Status: None   Collection Time: 02/04/19 11:27 AM  Result Value Ref Range Status   MRSA by PCR NEGATIVE NEGATIVE Final    Comment:        The GeneXpert MRSA Assay (FDA approved for NASAL specimens only), is one component of a comprehensive MRSA colonization surveillance program. It is not intended to diagnose MRSA infection nor to guide or monitor treatment for MRSA infections. Performed at [facility], [place] [address], [place], [place] [address]          Radiology Studies: Dg Abd 1 View  Result Date: 02/04/2019 CLINICAL DATA:  Male patient with nausea and vomiting EXAM: ABDOMEN - 1 VIEW COMPARISON:  02/04/2019 FINDINGS: Gas within stomach, small bowel, colon with no abnormal distention. No unexpected radiopaque foreign body. No unexpected soft tissue  density. No unexpected calcification. No displaced fracture. IMPRESSION: Nonobstructive bowel gas pattern. Electronically Signed   By: Corrie Mckusick D.O.   On: 02/04/2019 21:24        Scheduled Meds: . pantoprazole  40 mg Oral Daily  . phenytoin (DILANTIN) IV  100 mg Intravenous Q8H   Continuous Infusions: . lacosamide (VIMPAT) IV    . levETIRAcetam 1,500 mg (02/06/19 0636)     LOS: 2 days   Time spent: 40 minutes     Aedyn Mckeon, Geraldo Docker, MD [facility] Pager [phone]  If 7PM-7AM, please contact night-coverage [email or web address] Password [facility] 02/06/2019, 8:31 AM

## 2019-02-06 NOTE — Progress Notes (Signed)
PT Cancellation Note  Patient Details Name: Travis Briggs MRN: [id number] DOB: 10/10/1876   Cancelled Treatment:    Reason Eval/Treat Not Completed: Other (comment)(Pt on continuous EEG at this time.) PT will continue to follow acutely.    Earney Navy, PTA Acute Rehabilitation Services Pager: [phone] Office: [phone]   02/06/2019, 9:07 AM

## 2019-02-07 DIAGNOSIS — T1490XA Injury, unspecified, initial encounter: Secondary | ICD-10-CM

## 2019-02-07 LAB — COMPREHENSIVE METABOLIC PANEL
ALT: 29 U/L (ref 0–44)
ALT: 29 U/L (ref 0–44)
ALT: 25 U/L (ref 0–44)
ALT: 29 U/L (ref 0–44)
AST: 21 U/L (ref 15–41)
AST: 20 U/L (ref 15–41)
AST: 19 U/L (ref 15–41)
AST: 19 U/L (ref 15–41)
Albumin: 3.9 g/dL (ref 3.5–5.0)
Albumin: 3.9 g/dL (ref 3.5–5.0)
Albumin: 3.7 g/dL (ref 3.5–5.0)
Albumin: 3.7 g/dL (ref 3.5–5.0)
Alkaline Phosphatase: 79 U/L (ref 38–126)
Alkaline Phosphatase: 84 U/L (ref 38–126)
Alkaline Phosphatase: 80 U/L (ref 38–126)
Alkaline Phosphatase: 84 U/L (ref 38–126)
Anion gap: 11 (ref 5–15)
Anion gap: 11 (ref 5–15)
Anion gap: 14 (ref 5–15)
Anion gap: 9 (ref 5–15)
BUN: 9 mg/dL (ref 6–20)
BUN: 7 mg/dL (ref 6–20)
BUN: <5 mg/dL — ABNORMAL LOW (ref 6–20)
BUN: 5 mg/dL — ABNORMAL LOW (ref 6–20)
CO2: 23 mmol/L (ref 22–32)
CO2: 26 mmol/L (ref 22–32)
CO2: 22 mmol/L (ref 22–32)
CO2: 26 mmol/L (ref 22–32)
Calcium: 8.6 mg/dL — ABNORMAL LOW (ref 8.9–10.3)
Calcium: 8.7 mg/dL — ABNORMAL LOW (ref 8.9–10.3)
Calcium: 9.1 mg/dL (ref 8.9–10.3)
Calcium: 9.1 mg/dL (ref 8.9–10.3)
Chloride: 106 mmol/L (ref 98–111)
Chloride: 102 mmol/L (ref 98–111)
Chloride: 104 mmol/L (ref 98–111)
Chloride: 103 mmol/L (ref 98–111)
Creatinine, Ser: 0.91 mg/dL (ref 0.61–1.24)
Creatinine, Ser: 0.86 mg/dL (ref 0.61–1.24)
Creatinine, Ser: 0.8 mg/dL (ref 0.61–1.24)
Creatinine, Ser: 0.69 mg/dL (ref 0.61–1.24)
GFR calc Af Amer: 59 mL/min — ABNORMAL LOW (ref >60)
GFR calc Af Amer: >60 mL/min (ref >60)
GFR calc Af Amer: >60 mL/min (ref >60)
GFR calc Af Amer: >60 mL/min (ref >60)
GFR calc non Af Amer: 51 mL/min — ABNORMAL LOW (ref >60)
GFR calc non Af Amer: 53 mL/min — ABNORMAL LOW (ref >60)
GFR calc non Af Amer: 54 mL/min — ABNORMAL LOW (ref >60)
GFR calc non Af Amer: 58 mL/min — ABNORMAL LOW (ref >60)
Glucose, Bld: 95 mg/dL (ref 70–99)
Glucose, Bld: 154 mg/dL — ABNORMAL HIGH (ref 70–99)
Glucose, Bld: 151 mg/dL — ABNORMAL HIGH (ref 70–99)
Glucose, Bld: 106 mg/dL — ABNORMAL HIGH (ref 70–99)
Potassium: 3.7 mmol/L (ref 3.5–5.1)
Potassium: 3.3 mmol/L — ABNORMAL LOW (ref 3.5–5.1)
Potassium: 3.4 mmol/L — ABNORMAL LOW (ref 3.5–5.1)
Potassium: 3.6 mmol/L (ref 3.5–5.1)
Sodium: 140 mmol/L (ref 135–145)
Sodium: 139 mmol/L (ref 135–145)
Sodium: 140 mmol/L (ref 135–145)
Sodium: 138 mmol/L (ref 135–145)
Total Bilirubin: 0.6 mg/dL (ref 0.3–1.2)
Total Bilirubin: 0.5 mg/dL (ref 0.3–1.2)
Total Bilirubin: 0.3 mg/dL (ref 0.3–1.2)
Total Bilirubin: 0.6 mg/dL (ref 0.3–1.2)
Total Protein: 6.6 g/dL (ref 6.5–8.1)
Total Protein: 6.5 g/dL (ref 6.5–8.1)
Total Protein: 6.3 g/dL — ABNORMAL LOW (ref 6.5–8.1)
Total Protein: 6.5 g/dL (ref 6.5–8.1)

## 2019-02-07 LAB — BASIC METABOLIC PANEL
Anion gap: 11 (ref 5–15)
BUN: 7 mg/dL (ref 6–20)
CO2: 21 mmol/L — ABNORMAL LOW (ref 22–32)
Calcium: 9.5 mg/dL (ref 8.9–10.3)
Chloride: 106 mmol/L (ref 98–111)
Creatinine, Ser: 0.92 mg/dL (ref 0.61–1.24)
GFR calc Af Amer: 58 mL/min — ABNORMAL LOW (ref >60)
GFR calc non Af Amer: 50 mL/min — ABNORMAL LOW (ref >60)
Glucose, Bld: 100 mg/dL — ABNORMAL HIGH (ref 70–99)
Potassium: 4 mmol/L (ref 3.5–5.1)
Sodium: 138 mmol/L (ref 135–145)

## 2019-02-07 LAB — CBC
HCT: 44.5 % (ref 39.0–52.0)
Hemoglobin: 14.7 g/dL (ref 13.0–17.0)
MCH: 30.5 pg (ref 26.0–34.0)
MCHC: 33 g/dL (ref 30.0–36.0)
MCV: 92.3 fL (ref 80.0–100.0)
Platelets: 200 10*3/uL (ref 150–400)
RBC: 4.82 MIL/uL (ref 4.22–5.81)
RDW: 12 % (ref 11.5–15.5)
WBC: 6.5 10*3/uL (ref 4.0–10.5)
nRBC: 0 % (ref 0.0–0.2)

## 2019-02-07 LAB — GLUCOSE, CAPILLARY
Glucose-Capillary: 103 mg/dL — ABNORMAL HIGH (ref 70–99)
Glucose-Capillary: 192 mg/dL — ABNORMAL HIGH (ref 70–99)
Glucose-Capillary: 80 mg/dL (ref 70–99)
Glucose-Capillary: 88 mg/dL (ref 70–99)
Glucose-Capillary: 94 mg/dL (ref 70–99)

## 2019-02-07 LAB — MAGNESIUM: Magnesium: 2 mg/dL (ref 1.7–2.4)

## 2019-02-07 MED ORDER — LORAZEPAM 2 MG/ML IJ SOLN
1.0000 mg | Freq: Four times a day (QID) | INTRAMUSCULAR | Status: DC
  Administered 2019-02-07 – 2019-02-10 (×13): 1 mg via INTRAVENOUS
  Filled 2019-02-07 (×13): qty 1

## 2019-02-07 NOTE — Procedures (Signed)
CPT/Type of Study: 16109; 24hr EEG with video Recording Date: 02/06/2019 07:30 - 02/07/2019 07:30 Interpreting physician: Izora Ribas, DO  History: This is a 43 year old patient with history of TBI presenting with seizures, undergoing an EEG to evaluate for seizures.   Technical Description: The EEG was performed using standard setting per the guidelines of American Clinical Neurophysiology Society (ACNS).   A minimum of 21 electrodes were placed on scalp according to the International 10-20 or/and 10-10 Systems. Supplemental electrodes were placed as needed. Single EKG electrode was also used to detect cardiac arrhythmia. Patient's behavior was continuously recorded on video simultaneously with EEG. A minimum of 16 channels were used for data display. Each epoch of study was reviewed manually daily and as needed using standard referential and bipolar montages. Computerized quantitative EEG analysis (such as compressed spectral array analysis, trending, automated spike & seizure detection) were used as indicated.   Clinical State: Lethargy Background: The posterior dominant rhythm was recorded up to 8Hz  on the right  Overall Amplitude: Normal Predominant Frequency: Delta on the left  Asymmetry: Yes Sleep background: Stage II with sleep spindles   Abnormalities: Continuous slow lateralized left  Rhythmic or periodic pattern: None  Epileptiform activity: Yes frequent sharp waves left fronto-central maximal Fz, F3  Electrographic Seizure: Yes seizures were recorded from the left frontal region lasting on average 1 minute with no apparent clinical signs on video. 15 seizures were recorded. The last was 4/30 at 05:19. Events: No  Breach rhythm: Yes  Reactivity: Yes  Stimulation procedures:  Hyperventilation: Not done Photic stimulation: Not done  Impression: This EEG shows evidence of cerebral dysfunction in the left frontal region with epileptogenicity. 15 seizures were recorded from the  left frontal region lasting on average 1 minute with no apparent clinical signs on video. The last seizure was 4/30 at 05:19.

## 2019-02-07 NOTE — Progress Notes (Signed)
PROGRESS NOTE    Travis Briggs  [id number] DOB: 10/10/1876 DOA: 02/03/2019 PCP: No primary care provider on file.   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  It is unknown at this time whether patient has any additional past medical history.  They were called by an observer at a local bus stop informing them that the patient was having a seizure.  Upon arrival, EMS found the patient unattended having tonic-clonic seizure activity.  The individual who called EMS from the bus stop was no longer present on EMS arrival.  He was given 5 mg Versed IM.  In the ED, patient noted to have right hemiparesis, right gaze preference, and dense aphasia with inability to speak.  He had 2 more episodes of witnessed seizure activity in the ED.  Received a total of 4 mg Ativan, 1000 mg IV Keppra, and 1450 mg of IV fosphenytoin. Patient is very somnolent and nonverbal.  No history could be obtained from him.     Subjective: 4/30 A/O x1 (does not know where, when, why) follows some commands.  Perseveration    Assessment & Plan:   Principal Problem:   Status epilepticus ([place]) Active Problems:   Dental cyst   Stroke (cerebrum) ([place])  Status epilepticus -Abnormal EEG see results below -Multiple seizures prior to presentation: Residual RIGHT sided weakness (Todd's paralysis?) -4/30 per neurology note patient continues to have frequent subclinical seizures without return to baseline -4/30 Ativan 1 mg QID -Lacosamide 200 mg twice daily - Keppra 1500 mg twice daily - Ativan 2 mg PRN - Phenytoin 100 mg every 8 hours - Topiramate 200 mg twice daily   Hx TBI -S/p craniectomy and cranioplasty -Baseline, see acute encephalopathy  Acute encephalopathy -Patient's baseline:- Per chart at admission  patient was discussed with his Sister Travis Briggs at baseline has poor memory will forget his name birthday.  Has word finding difficulty, aphasia.  Is supposed to wear helmet - will need to order cranial helmet -Sister Travis Briggs can be reache at [phone].   Nausea/vomiting -Resolved      Hiccups:  -Reglan prn   Dental cyst -CT showing cystic lesion involving the right mandible which is associated with the right lower premolar dental root.  This is thought to be most likely a dental cyst which could be due to a radicular cyst or chronic infection.  Patient is afebrile and does not have leukocytosis. -He will need dentistry follow-up.  Thrombocytopenia -Stable    DVT prophylaxis: SCD (low platelets) Code Status: Full Family Communication: None Disposition Plan: TBD   Consultants:  Neurology    Procedures/Significant Events:  4/28 JOA:CZYSAYTK to severe left hemispheric slowing is observed.  This can be associated with cerebral disturbance and/or epileptic focus. 2.  Multiple episodes of frontocentral epileptiform discharges. 3.  Single episode of electrographic seizures emanating from the left frontocentral region.  4/29 24-hour ZSW:FUXNATFT dysfunction in the left frontal region with epileptogenicity. Frequent seizures were recorded from the left frontal region lasting on average 1-2 minutes with no apparent clinical signs on video. The event button pushes did not correlate with electrographic seizures.    I have personally reviewed and interpreted all radiology studies and my findings are as above.  VENTILATOR SETTINGS:    Cultures   Antimicrobials: Anti-infectives (From admission,  onward)   None       Devices    LINES / TUBES:     Continuous Infusions: . lacosamide (VIMPAT) IV 200 mg (02/06/19 2139)  . levETIRAcetam 1,500 mg (02/07/19 0556)     Objective: Vitals:   02/06/19 2030 02/07/19 0021 02/07/19 0420 02/07/19 0743  BP: (!) 131/98 127/89 (!)  131/95 126/90  Pulse: (!) 52 (!) 58 (!) 56 (!) 59  Resp: 17 17 16 15   Temp: 98.5 F (36.9 C) 98.5 F (36.9 C) 99.1 F (37.3 C) 99.1 F (37.3 C)  TempSrc: Oral Oral Oral Oral  SpO2: 100% 100% 100% 100%  Weight:      Height:        Intake/Output Summary (Last 24 hours) at 02/07/2019 0857 Last data filed at 02/07/2019 0420 Gross per 24 hour  Intake 645 ml  Output 1900 ml  Net -1255 ml   Filed Weights   02/03/19 1642  Weight: 72.6 kg   General: A/O x1 (does not know where, when, why) no acute respiratory distress Eyes: negative scleral hemorrhage, negative anisocoria, negative icterus ENT: Negative Runny nose, negative gingival bleeding, Neck:  Negative scars, masses, torticollis, lymphadenopathy, JVD Lungs: Clear to auscultation bilaterally without wheezes or crackles Cardiovascular: Regular rate and rhythm without murmur gallop or rub normal S1 and S2 Abdomen: negative abdominal pain, nondistended, positive soft, bowel sounds, no rebound, no ascites, no appreciable mass Extremities: No significant cyanosis, clubbing, or edema bilateral lower extremities Skin: Negative rashes, lesions, ulcers Psychiatric: Unable to evaluate secondary to patient's altered mental status  Central nervous system:  Cranial nerves II through XII intact, tongue/uvula midline, all extremities muscle strength 5/5, sensation intact throughout, positive expressive aphasia, negative receptive aphasia.   .     Data Reviewed: Care during the described time interval was provided by me .  I have reviewed this patient's available data, including medical history, events of note, physical examination, and all test results as part of my evaluation.   CBC: Recent Labs  Lab 02/03/19 1745 02/04/19 0814 02/04/19 1530 02/05/19 0407 02/06/19 0452  WBC 6.5 8.0  --  8.1 6.5  NEUTROABS 4.5  --   --   --   --   HGB 13.7 13.2 12.7* 12.7* 13.5  HCT 42.4 40.3 39.7 38.6* 40.8  MCV 96.4 93.1  --  93.0 91.5  PLT 201  194  --  194 086   Basic Metabolic Panel: Recent Labs  Lab 02/03/19 1745 02/04/19 0814 02/04/19 1816 02/05/19 0407 02/05/19 1502 02/06/19 0452  NA 140 140 139  --  140 138  K 3.8 3.7 3.3*  --  3.4* 3.6  CL 106 106 102  --  104 103  CO2 25 23 26   --  22 26  GLUCOSE 83 95 154*  --  151* 106*  BUN 15 9 7*  --  <5* 5*  CREATININE 0.94 0.91 0.86  --  0.80 0.69  CALCIUM 9.4 8.6* 8.7*  --  9.1 9.1  MG  --   --   --  2.0  --  1.8   GFR: Estimated Creatinine Clearance: -3.7 mL/min (by C-G formula based on SCr of 0.69 mg/dL). Liver Function Tests: Recent Labs  Lab 02/03/19 1745 02/04/19 0814 02/04/19 1816 02/05/19 1502 02/06/19 0452  AST 22 21 20 19 19   ALT 31 29 29 25 29   ALKPHOS 89 79 84 80 84  BILITOT 0.6 0.6 0.5 0.3 0.6  PROT 8.1 6.6 6.5 6.3* 6.5  ALBUMIN 4.9 3.9 3.9 3.7 3.7   No results for input(s): LIPASE, AMYLASE in the last 168 hours. No results for input(s): AMMONIA in the last 168 hours. Coagulation Profile: Recent Labs  Lab 02/03/19 1745  INR 1.0   Cardiac Enzymes: No results for input(s): CKTOTAL, CKMB, CKMBINDEX, TROPONINI in the last 168 hours. BNP (last 3 results) No results for input(s): PROBNP in the last 8760 hours. HbA1C: Recent Labs    02/05/19 0407  HGBA1C 5.3   CBG: Recent Labs  Lab 02/06/19 1542 02/06/19 2028 02/07/19 0023 02/07/19 0419 02/07/19 0740  GLUCAP 92 97 103* 80 94   Lipid Profile: Recent Labs    02/05/19 0407  CHOL 132  HDL 41  LDLCALC 66  TRIG 125  CHOLHDL 3.2   Thyroid Function Tests: No results for input(s): TSH, T4TOTAL, FREET4, T3FREE, THYROIDAB in the last 72 hours. Anemia Panel: No results for input(s): VITAMINB12, FOLATE, FERRITIN, TIBC, IRON, RETICCTPCT in the last 72 hours. Urine analysis: No results found for: COLORURINE, APPEARANCEUR, LABSPEC, PHURINE, GLUCOSEU, HGBUR, BILIRUBINUR, KETONESUR, PROTEINUR, UROBILINOGEN, NITRITE, LEUKOCYTESUR Sepsis Labs: @LABRCNTIP (procalcitonin:4,lacticidven:4)  )  Recent Results (from the past 240 hour(s))  SARS Coronavirus 2 [facility] order, Performed in [place] hospital lab)     Status: None   Collection Time: 02/03/19  7:46 PM  Result Value Ref Range Status   SARS Coronavirus 2 NEGATIVE NEGATIVE Final    Comment: (NOTE) If result is NEGATIVE SARS-CoV-2 target nucleic acids are NOT DETECTED. The SARS-CoV-2 RNA is generally detectable in upper and lower  respiratory specimens during the acute phase of infection. The lowest  concentration of SARS-CoV-2 viral copies this assay can detect is 250  copies / mL. A negative result does not preclude SARS-CoV-2 infection  and should not be used as the sole basis for treatment or other  patient management decisions.  A negative result may occur with  improper specimen collection / handling, submission of specimen other  than nasopharyngeal swab, presence of viral mutation(s) within the  areas targeted by this assay, and inadequate number of viral copies  (<250 copies / mL). A negative result must be combined with clinical  observations, patient history, and epidemiological information. If result is POSITIVE SARS-CoV-2 target nucleic acids are DETECTED. The SARS-CoV-2 RNA is generally detectable in upper and lower  respiratory specimens dur ing the acute phase of infection.  Positive  results are indicative of active infection with SARS-CoV-2.  Clinical  correlation with patient history and other diagnostic information is  necessary to determine patient infection status.  Positive results do  not rule out bacterial infection or co-infection with other viruses. If result is PRESUMPTIVE POSTIVE SARS-CoV-2 nucleic acids MAY BE PRESENT.   A presumptive positive result was obtained on the submitted specimen  and confirmed on repeat testing.  While 2019 novel coronavirus  (SARS-CoV-2) nucleic acids may be present in the submitted sample  additional confirmatory testing may be necessary for  epidemiological  and / or clinical management purposes  to differentiate between  SARS-CoV-2 and other Sarbecovirus currently known to infect humans.  If clinically indicated additional testing with an alternate test  methodology [phone]) is advised. The SARS-CoV-2 RNA is generally  detectable in upper and lower respiratory sp ecimens during the acute  phase of infection. The expected result is Negative. Fact Sheet for Patients:  StrictlyIdeas.no Fact Sheet for Healthcare Providers: BankingDealers.co.za This test is not yet approved or cleared by the [place] FDA and has been authorized for detection  and/or diagnosis of SARS-CoV-2 by FDA under an Emergency Use Authorization (EUA).  This EUA will remain in effect (meaning this test can be used) for the duration of the COVID-19 declaration under Section 564(b)(1) of the Act, 21 U.S.C. section 360bbb-3(b)(1), unless the authorization is terminated or revoked sooner. Performed at [facility] [place] [address], [place], [place] [address]   MRSA PCR Screening     Status: None   Collection Time: 02/04/19 11:27 AM  Result Value Ref Range Status   MRSA by PCR NEGATIVE NEGATIVE Final    Comment:        The GeneXpert MRSA Assay (FDA approved for NASAL specimens only), is one component of a comprehensive MRSA colonization surveillance program. It is not intended to diagnose MRSA infection nor to guide or monitor treatment for MRSA infections. Performed at [facility], [place] [address] [place], [place] [address]          Radiology Studies: No results found.      Scheduled Meds: . pantoprazole  40 mg Oral Daily  . phenytoin (DILANTIN) IV  100 mg Intravenous Q8H  . topiramate  200 mg Oral BID   Continuous Infusions: . lacosamide (VIMPAT) IV 200 mg (02/06/19 2139)  . levETIRAcetam 1,500 mg (02/07/19 0556)     LOS: 3 days   Time spent: 40 minutes      Elwood Bazinet, Geraldo Docker, MD [facility] Pager [phone]  If 7PM-7AM, please contact night-coverage [email or web address] Password [facility] 02/07/2019, 8:57 AM

## 2019-02-07 NOTE — Progress Notes (Addendum)
Per phone conversation with the patient's sister, Queen [place] [phone], his date of birth is September 04, 1976 and he was living at [facility] [place] up until this admission.  I confirmed this information with [facility] this morning. We will update his PTA meds ASAP when we receive the fax from them.   Romeo Rabon, PharmD. Mobile: [phone]. 02/07/2019,9:14 AM.  Per Dr. Sherral Hammers note 02/06/19, his real MRN is [id number]. I notified Patient Access via email.  Romeo Rabon, PharmD. Mobile: [phone]. 02/07/2019,1:02 PM.

## 2019-02-07 NOTE — Progress Notes (Signed)
OT Cancellation Note  Patient Details Name: Travis Briggs MRN: [id number] DOB: 10/10/1876   Cancelled Treatment:     Attempted to see pt today but unable due to testing/precedure. Will attempt late this week as able.    Quay Burow, OTR/L 02/07/2019, 12:38 PM

## 2019-02-07 NOTE — Progress Notes (Signed)
Reason for consult: Status epilepticus  Subjective: Patient appears worse today when compared to yesterday.  Right side appears weaker and more aphasic.   PTW:SFKCLE to obtain due to poor mental status  Examination  Vital signs in last 24 hours: Temp:  [98.5 F (36.9 C)-99.1 F (37.3 C)] 99.1 F (37.3 C) (04/30 0743) Pulse Rate:  [52-59] 59 (04/30 0743) Resp:  [15-18] 15 (04/30 0743) BP: (126-145)/(87-98) 126/90 (04/30 0743) SpO2:  [100 %] 100 % (04/30 0743)  General: lying in bed CVS: pulse-normal rate and rhythm RS: breathing comfortably Extremities: normal   Neuro: MS: Confused, unable to tell me his name.  Appears to perseverate on the nonsensical word " PROME".  Follows some simple commands intermittently, less compared to the prior day CN: pupils equal and reactive,  EOMI, face symmetric, tongue midline,  Motor: 3/5 strength in right upper extremity and 3/5 strength in right lower extremity.  5 out of 5 strength in left upper and lower extremities. Reflexes: Plantars downgoing bilaterally Coordination: Unable to assess Gait: not tested  Basic Metabolic Panel: Recent Labs  Lab 02/03/19 1745 02/04/19 0814 02/04/19 1816 02/05/19 0407 02/05/19 1502 02/06/19 0452  NA 140 140 139  --  140 138  K 3.8 3.7 3.3*  --  3.4* 3.6  CL 106 106 102  --  104 103  CO2 25 23 26   --  22 26  GLUCOSE 83 95 154*  --  151* 106*  BUN 15 9 7*  --  <5* 5*  CREATININE 0.94 0.91 0.86  --  0.80 0.69  CALCIUM 9.4 8.6* 8.7*  --  9.1 9.1  MG  --   --   --  2.0  --  1.8    CBC: Recent Labs  Lab 02/03/19 1745 02/04/19 0814 02/04/19 1530 02/05/19 0407 02/06/19 0452  WBC 6.5 8.0  --  8.1 6.5  NEUTROABS 4.5  --   --   --   --   HGB 13.7 13.2 12.7* 12.7* 13.5  HCT 42.4 40.3 39.7 38.6* 40.8  MCV 96.4 93.1  --  93.0 91.5  PLT 201 194  --  194 180     Coagulation Studies: No results for input(s): LABPROT, INR in the last 72 hours.  Imaging Reviewed:     ASSESSMENT AND  PLAN  43 year old male with past medical history of traumatic brain injury status post craniotomy/cranioplasty on the left presenting with general tonic-clonic seizure. Has had no further clinical seizures but has remained aphasic with right hemiparesis.  Patient was placed on long-term EEG monitoring since 4/28-continues to have brief seizures lasting about 1 minute.  Vimpat and Topamax was added improvement in seizure frequency and yesterday afternoon patient had improved clinically.  Reviewing EEG today, patient still continues to have right electrographic seizures approximately 1 seizure every 1-2 hours. On examination this morning, he appeared to be more aphasic compared to yesterday.  Added scheduled Ativan 1 mg every 6 hours.  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 Seizure precautions  This patient is neurologically critically ill due tomultiple seizures without return to baseline concerning for subclinical seizure activity. He is at risk for significant risk of neurological worseningfrom worsening seizures, respiratory  failure, aspiration and possible sudden death in epilepsy. This patient's care requires constant monitoring of vital signs, hemodynamics, respiratory and cardiac monitoring,obtaining and reviewing EEG, discussing with epileptologist.  I spent 35 minutes of critical care time with this patient   Karena Addison Debbie Yearick [facility] Pager Number [phone] For questions after 7pm please refer to AMION to reach the Neurologist on call

## 2019-02-07 NOTE — Progress Notes (Signed)
  Speech Language Pathology Treatment: Cognitive-Linquistic(Aphasia)  Patient Details Name: Travis Briggs. MRN: [id number] DOB: 06-14-1976 Today's Date: 02/07/2019 Time:  -     Assessment / Plan / Recommendation Clinical Impression  Pt was seen for treatment and his baseline level of alertness was reduced compared to that which was noted yesterday. His verbal output was also notably reduced compared to yesterday even when adequate alertness was demonstrated. He was able to count from 1-14 with consistent phonemic cues. He completed confrontational naming tasks with 20% accuracy increasing to 80% accuracy with max cues. He demonstrated 40% accuracy with yes/no questions but he continues to demonstrate perseveration on "yes" so the use of yes/no questions as a measure of auditory comprehension is cautioned. He completed 1-step commands with 33% accuracy. The session was ultimately terminated due to the pt's inability to maintain an adequate level of alertness to continue participation in the session. SLP will continue to follow.    HPI HPI: Pt is a 43 y.o. 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 EMS was called after bystanders observed patient having a seizure at a local bus stop. When EMS arrived they noted him having tonic-clonic seizure activity. Patient was given 5 mg of Versed IM. In ED he was noted to have right hemiparesis, right gaze preference, and a right dense aphasia with inability to speak.  MRI of the brain revealed bifrontal, left temporal and parietal encephalomalacia with no acute intracranial abnormality identified. EEG was performed and showed evidence of cerebral dysfunction in theleft frontal region with epileptogenicity.Frequent seizures were recorded from the left frontal region lasting on average 1-2 minutes with no apparent clinical signs  on video.       SLP Plan  Continue with current plan of care       Recommendations Dysphagia 3  Liquids provided via: Cup;Straw Medication Administration: Whole meds with puree Supervision: Full supervision/cueing for compensatory strategies Compensations: Slow rate;Small sips/bites;Minimize environmental distractions Postural Changes and/or Swallow Maneuvers: Seated upright 90 degrees                Oral Care Recommendations: Oral care BID Follow up Recommendations: Skilled Nursing facility SLP Visit Diagnosis: Aphasia (R47.01) Plan: Continue with current plan of care       Jocie Meroney I. Hardin Negus, [place], [place] Office number [phone] Pager [place] 02/07/2019, 4:56 PM

## 2019-02-07 NOTE — Progress Notes (Signed)
EEG maint complete. Continue monitor

## 2019-02-08 DIAGNOSIS — F502 Bulimia nervosa: Secondary | ICD-10-CM

## 2019-02-08 DIAGNOSIS — D696 Thrombocytopenia, unspecified: Secondary | ICD-10-CM

## 2019-02-08 DIAGNOSIS — R404 Transient alteration of awareness: Secondary | ICD-10-CM

## 2019-02-08 LAB — BASIC METABOLIC PANEL
Anion gap: 11 (ref 5–15)
BUN: 9 mg/dL (ref 6–20)
CO2: 20 mmol/L — ABNORMAL LOW (ref 22–32)
Calcium: 9.4 mg/dL (ref 8.9–10.3)
Chloride: 107 mmol/L (ref 98–111)
Creatinine, Ser: 1.08 mg/dL (ref 0.61–1.24)
GFR calc Af Amer: >60 mL/min (ref >60)
GFR calc non Af Amer: >60 mL/min (ref >60)
Glucose, Bld: 107 mg/dL — ABNORMAL HIGH (ref 70–99)
Potassium: 4.1 mmol/L (ref 3.5–5.1)
Sodium: 138 mmol/L (ref 135–145)

## 2019-02-08 LAB — CBC
HCT: 43.9 % (ref 39.0–52.0)
Hemoglobin: 14.6 g/dL (ref 13.0–17.0)
MCH: 30.8 pg (ref 26.0–34.0)
MCHC: 33.3 g/dL (ref 30.0–36.0)
MCV: 92.6 fL (ref 80.0–100.0)
Platelets: 214 10*3/uL (ref 150–400)
RBC: 4.74 MIL/uL (ref 4.22–5.81)
RDW: 12.1 % (ref 11.5–15.5)
WBC: 6 10*3/uL (ref 4.0–10.5)
nRBC: 0 % (ref 0.0–0.2)

## 2019-02-08 LAB — GLUCOSE, CAPILLARY
Glucose-Capillary: 102 mg/dL — ABNORMAL HIGH (ref 70–99)
Glucose-Capillary: 106 mg/dL — ABNORMAL HIGH (ref 70–99)
Glucose-Capillary: 109 mg/dL — ABNORMAL HIGH (ref 70–99)
Glucose-Capillary: 110 mg/dL — ABNORMAL HIGH (ref 70–99)
Glucose-Capillary: 114 mg/dL — ABNORMAL HIGH (ref 70–99)
Glucose-Capillary: 135 mg/dL — ABNORMAL HIGH (ref 70–99)
Glucose-Capillary: 168 mg/dL — ABNORMAL HIGH (ref 70–99)
Glucose-Capillary: 89 mg/dL (ref 70–99)
Glucose-Capillary: 92 mg/dL (ref 70–99)
Glucose-Capillary: 92 mg/dL (ref 70–99)
Glucose-Capillary: 97 mg/dL (ref 70–99)

## 2019-02-08 LAB — MAGNESIUM: Magnesium: 1.9 mg/dL (ref 1.7–2.4)

## 2019-02-08 NOTE — Progress Notes (Signed)
LTM maintenance completed; fixed Fp1 and Fp2, no skin breakdown was seen.

## 2019-02-08 NOTE — Progress Notes (Signed)
PROGRESS NOTE    Travis Briggs.  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  It is unknown at this time whether patient has any additional past medical history.  They were called by an observer at a local bus stop informing them that the patient was having a seizure.  Upon arrival, EMS found the patient unattended having tonic-clonic seizure activity.  The individual who called EMS from the bus stop was no longer present on EMS arrival.  He was given 5 mg Versed IM.  In the ED, patient noted to have right hemiparesis, right gaze preference, and dense aphasia with inability to speak.  He had 2 more episodes of witnessed seizure activity in the ED.  Received a total of 4 mg Ativan, 1000 mg IV Keppra, and 1450 mg of IV fosphenytoin. Patient is very somnolent and nonverbal.  No history could be obtained from him.     Subjective: 5/1 A/O x1 (does not know where, when, why) perseveration constantly repeats that he loves you and blows kisses at you.   Assessment & Plan:   Principal Problem:   Status epilepticus [facility]) Active Problems:   Dental cyst   Stroke (cerebrum) ([place])  Status epilepticus -5/1 abnormal EEG from 4/30 see results below.   -Multiple seizures prior to presentation: Residual RIGHT sided weakness (Todd's paralysis?) -4/30 per neurology note patient continues to have frequent subclinical seizures without return to baseline -Lacosamide 200 mg twice daily -Keppra 1500 mg twice daily -Ativan 1 mg QID -Ativan as needed -Phenytoin 100 mg every 8 hours -Topamax 200 mg twice daily   Hx TBI -S/p craniectomy and cranioplasty -Baseline, see acute encephalopathy  Acute encephalopathy -Patient's  baseline:- Per chart at admission patient was discussed with his Sister Travis Briggs at baseline has poor memory will forget his name birthday.  Has word finding difficulty, aphasia.  Is supposed to wear helmet - will need to order cranial helmet -Sister Travis Briggs can be reache at [phone].   Nausea/vomiting -Resolved      Hiccups:  -Reglan prn   Dental cyst -CT showing cystic lesion involving the right mandible which is associated with the right lower premolar dental root.  This is thought to be most likely a dental cyst which could be due to a radicular cyst or chronic infection.  Patient is afebrile and does not have leukocytosis. -He will need dentistry follow-up.  Thrombocytopenia -Improving continue to monitor closely    DVT prophylaxis: SCD (low platelets) Code Status: Full Family Communication: None Disposition Plan: TBD   Consultants:  Neurology    Procedures/Significant Events:  4/28 PJK:DTOIZTIW to severe left hemispheric slowing is observed.  This can be associated with cerebral disturbance and/or epileptic focus. 2.  Multiple episodes of frontocentral epileptiform discharges. 3.  Single episode of electrographic seizures emanating from the left frontocentral region. 4/29 24-hour PYK:DXIPJASN dysfunction in the left frontal region with epileptogenicity. Frequent seizures were recorded from the left frontal region lasting on average 1-2 minutes with no apparent clinical signs on video. The event button pushes did not correlate with electrographic seizures.  4/30 KNL:ZJQBHALP of cerebral dysfunction in the left frontal region with potential epileptogenicity. No seizures were recorded during this portion of the  recording. The last seizure was 4/30 at 05:19    I have personally reviewed and interpreted all radiology studies and my findings are as above.  VENTILATOR SETTINGS:    Cultures   Antimicrobials: Anti-infectives (From admission, onward)   None        Devices    LINES / TUBES:     Continuous Infusions: . lacosamide (VIMPAT) IV Stopped (02/08/19 0744)  . levETIRAcetam 1,500 mg (02/08/19 0523)     Objective: Vitals:   02/07/19 1945 02/07/19 2352 02/08/19 0030 02/08/19 0551  BP: 127/88 (!) 137/122 118/78 125/82  Pulse: 64 68 80 75  Resp: 20 20  20   Temp: 98.8 F (37.1 C) 98.2 F (36.8 C)  98.5 F (36.9 C)  TempSrc: Oral Oral  Oral  SpO2: 100% 100%  100%  Weight:      Height:        Intake/Output Summary (Last 24 hours) at 02/08/2019 9179 Last data filed at 02/08/2019 0555 Gross per 24 hour  Intake 120 ml  Output 1400 ml  Net -1280 ml   Filed Weights   02/03/19 1642  Weight: 72.6 kg   General: A/O x1 (does not know where, when, why), perseveration keeps stating I love you, blowing kisses no acute respiratory distress Eyes: negative scleral hemorrhage, negative anisocoria, negative icterus ENT: Negative Runny nose, negative gingival bleeding, Neck:  Negative scars, masses, torticollis, lymphadenopathy, JVD Lungs: Clear to auscultation bilaterally without wheezes or crackles Cardiovascular: Regular rate and rhythm without murmur gallop or rub normal S1 and S2 Abdomen: negative abdominal pain, nondistended, positive soft, bowel sounds, no rebound, no ascites, no appreciable mass Extremities: No significant cyanosis, clubbing, or edema bilateral lower extremities Skin: Negative rashes, lesions, ulcers Psychiatric: Perseveration (states I love you over and over again blowing kisses) unable to evaluate secondary to patient's continued seizure activity  Central nervous system:  Cranial nerves II through XII intact, tongue/uvula midline, all extremities muscle strength 5/5, sensation intact throughout,negative dysarthria, positive  expressive aphasia, negative receptive aphasia.  .     Data Reviewed: Care during the described time interval was provided by me .  I have reviewed this patient's available data, including  medical history, events of note, physical examination, and all test results as part of my evaluation.   CBC: Recent Labs  Lab 02/03/19 1745 02/04/19 0814 02/04/19 1530 02/05/19 0407 02/06/19 0452 02/07/19 0937 02/08/19 0339  WBC 6.5 8.0  --  8.1 6.5 6.5 6.0  NEUTROABS 4.5  --   --   --   --   --   --   HGB 13.7 13.2 12.7* 12.7* 13.5 14.7 14.6  HCT 42.4 40.3 39.7 38.6* 40.8 44.5 43.9  MCV 96.4 93.1  --  93.0 91.5 92.3 92.6  PLT 201 194  --  194 180 200 150   Basic Metabolic Panel: Recent Labs  Lab 02/04/19 1816 02/05/19 0407 02/05/19 1502 02/06/19 0452 02/07/19 0937 02/08/19 0339  NA 139  --  140 138 138 138  K 3.3*  --  3.4* 3.6 4.0 4.1  CL 102  --  104 103 106 107  CO2 26  --  22 26 21* 20*  GLUCOSE 154*  --  151* 106* 100* 107*  BUN 7  --  <5* 5* 7 9  CREATININE 0.86  --  0.80 0.69 0.92 1.08  CALCIUM 8.7*  --  9.1 9.1 9.5 9.4  MG  --  2.0  --  1.8 2.0 1.9   GFR:  Estimated Creatinine Clearance: 87.3 mL/min (by C-G formula based on SCr of 1.08 mg/dL). Liver Function Tests: Recent Labs  Lab 02/03/19 1745 02/04/19 0814 02/04/19 1816 02/05/19 1502 02/06/19 0452  AST 22 21 20 19 19   ALT 31 29 29 25 29   ALKPHOS 89 79 84 80 84  BILITOT 0.6 0.6 0.5 0.3 0.6  PROT 8.1 6.6 6.5 6.3* 6.5  ALBUMIN 4.9 3.9 3.9 3.7 3.7   No results for input(s): LIPASE, AMYLASE in the last 168 hours. No results for input(s): AMMONIA in the last 168 hours. Coagulation Profile: Recent Labs  Lab 02/03/19 1745  INR 1.0   Cardiac Enzymes: No results for input(s): CKTOTAL, CKMB, CKMBINDEX, TROPONINI in the last 168 hours. BNP (last 3 results) No results for input(s): PROBNP in the last 8760 hours. HbA1C: No results for input(s): HGBA1C in the last 72 hours. CBG: Recent Labs  Lab 02/07/19 0740 02/07/19 1233 02/07/19 1631 02/08/19 0014 02/08/19 0511  GLUCAP 94 192* 88 102* 106*   Lipid Profile: No results for input(s): CHOL, HDL, LDLCALC, TRIG, CHOLHDL, LDLDIRECT in the last  72 hours. Thyroid Function Tests: No results for input(s): TSH, T4TOTAL, FREET4, T3FREE, THYROIDAB in the last 72 hours. Anemia Panel: No results for input(s): VITAMINB12, FOLATE, FERRITIN, TIBC, IRON, RETICCTPCT in the last 72 hours. Urine analysis: No results found for: COLORURINE, APPEARANCEUR, LABSPEC, PHURINE, GLUCOSEU, HGBUR, BILIRUBINUR, KETONESUR, PROTEINUR, UROBILINOGEN, NITRITE, LEUKOCYTESUR Sepsis Labs: @LABRCNTIP (procalcitonin:4,lacticidven:4)  ) Recent Results (from the past 240 hour(s))  SARS Coronavirus 2 [facility] order, Performed in [place] hospital lab)     Status: None   Collection Time: 02/03/19  7:46 PM  Result Value Ref Range Status   SARS Coronavirus 2 NEGATIVE NEGATIVE Final    Comment: (NOTE) If result is NEGATIVE SARS-CoV-2 target nucleic acids are NOT DETECTED. The SARS-CoV-2 RNA is generally detectable in upper and lower  respiratory specimens during the acute phase of infection. The lowest  concentration of SARS-CoV-2 viral copies this assay can detect is 250  copies / mL. A negative result does not preclude SARS-CoV-2 infection  and should not be used as the sole basis for treatment or other  patient management decisions.  A negative result may occur with  improper specimen collection / handling, submission of specimen other  than nasopharyngeal swab, presence of viral mutation(s) within the  areas targeted by this assay, and inadequate number of viral copies  (<250 copies / mL). A negative result must be combined with clinical  observations, patient history, and epidemiological information. If result is POSITIVE SARS-CoV-2 target nucleic acids are DETECTED. The SARS-CoV-2 RNA is generally detectable in upper and lower  respiratory specimens dur ing the acute phase of infection.  Positive  results are indicative of active infection with SARS-CoV-2.  Clinical  correlation with patient history and other diagnostic information is  necessary to  determine patient infection status.  Positive results do  not rule out bacterial infection or co-infection with other viruses. If result is PRESUMPTIVE POSTIVE SARS-CoV-2 nucleic acids MAY BE PRESENT.   A presumptive positive result was obtained on the submitted specimen  and confirmed on repeat testing.  While 2019 novel coronavirus  (SARS-CoV-2) nucleic acids may be present in the submitted sample  additional confirmatory testing may be necessary for epidemiological  and / or clinical management purposes  to differentiate between  SARS-CoV-2 and other Sarbecovirus currently known to infect humans.  If clinically indicated additional testing with an alternate test  methodology [phone]) is advised. The  SARS-CoV-2 RNA is generally  detectable in upper and lower respiratory sp ecimens during the acute  phase of infection. The expected result is Negative. Fact Sheet for Patients:  StrictlyIdeas.no Fact Sheet for Healthcare Providers: BankingDealers.co.za This test is not yet approved or cleared by the [place] FDA and has been authorized for detection and/or diagnosis of SARS-CoV-2 by FDA under an Emergency Use Authorization (EUA).  This EUA will remain in effect (meaning this test can be used) for the duration of the COVID-19 declaration under Section 564(b)(1) of the Act, 21 U.S.C. section 360bbb-3(b)(1), unless the authorization is terminated or revoked sooner. Performed at [facility], [place] [address], [place], [place] [address]   MRSA PCR Screening     Status: None   Collection Time: 02/04/19 11:27 AM  Result Value Ref Range Status   MRSA by PCR NEGATIVE NEGATIVE Final    Comment:        The GeneXpert MRSA Assay (FDA approved for NASAL specimens only), is one component of a comprehensive MRSA colonization surveillance program. It is not intended to diagnose MRSA infection nor to guide or monitor  treatment for MRSA infections. Performed at [facility], [place] [address] [place], [place] [address]          Radiology Studies: No results found.      Scheduled Meds: . LORazepam  1 mg Intravenous Q6H  . pantoprazole  40 mg Oral Daily  . phenytoin (DILANTIN) IV  100 mg Intravenous Q8H  . topiramate  200 mg Oral BID   Continuous Infusions: . lacosamide (VIMPAT) IV Stopped (02/08/19 0744)  . levETIRAcetam 1,500 mg (02/08/19 0523)     LOS: 4 days   Time spent: 40 minutes     Travis Briggs, Geraldo Docker, MD [facility] Pager [phone]  If 7PM-7AM, please contact night-coverage [email or web address] Password [email or web address] 02/08/2019, 8:22 AM

## 2019-02-08 NOTE — Progress Notes (Signed)
Occupational Therapy Treatment Patient Details Name: Travis Briggs. MRN: [id number] DOB: Apr 23, 1976 Today's Date: 02/08/2019    History of present illness Pt is an unknown male with unknown age that presenting to hospital after seizure like activity that happened at a bus stop. Per notes, pt with history of TBI with craniotomy. Uknown if other PMH.    OT comments  Pt restless and verbalizing the need to leave without clear purpose at this time. Pt unable to write his name or explain his needs at this time. Pt did express the food here "sucks". Pt distracted and returned to supine.    Follow Up Recommendations  SNF    Equipment Recommendations       Recommendations for Other Services      Precautions / Restrictions Precautions Precautions: Fall;Other (comment) Precaution Comments: seizure       Mobility Bed Mobility Overal bed mobility: Needs Assistance Bed Mobility: Supine to Sit;Sit to Supine     Supine to sit: Min guard Sit to supine: Min guard   General bed mobility comments: pt attempting to exit the bed in the bottom L corner. pt laying down on request. pt total +3 to scoot back to the [facility] to lay supine  Transfers                 General transfer comment: not attempted due to inceased confusion and EEG in place    Balance                                           ADL either performed or assessed with clinical judgement   ADL Overall ADL's : Needs assistance/impaired                                       General ADL Comments: pt perseverating the same statements. pt attempting to exit the bed with EEG continuous on head. pt distracted for RN to provide IV medication. pt (A)ed back to supine. pt more common and remaining in the bed now at this time     Vision       Perception     Praxis      Cognition Arousal/Alertness: Awake/alert Behavior During Therapy: WFL for tasks assessed/performed Overall  Cognitive Status: Difficult to assess Area of Impairment: Awareness;Orientation                 Orientation Level: Place;Time;Situation         Awareness: Intellectual   General Comments: pt keeps repeating that he needs to leave. pt unable to write his name. pt writing 4, 3, 2, and crossing out each number before writing the next number. pt unable to spell name        Exercises     Shoulder Instructions       General Comments Pt expressing the need to leave and reports that lunch "sucks".    Pertinent Vitals/ Pain       Pain Assessment: No/denies pain Faces Pain Scale: No hurt  Home Living                                          Prior Functioning/Environment  Frequency  Min 3X/week        Progress Toward Goals  OT Goals(current goals can now be found in the care plan section)  Progress towards OT goals: Not progressing toward goals - comment(EEG continuous and agitated)  Acute Rehab OT Goals Patient Stated Goal: unable to state OT Goal Formulation: Patient unable to participate in goal setting Time For Goal Achievement: 02/12/19 ADL Goals Pt Will Perform Eating: with modified independence Pt Will Perform Grooming: with modified independence Pt Will Perform Upper Body Bathing: with supervision Pt Will Perform Lower Body Bathing: with supervision Pt Will Perform Upper Body Dressing: with supervision Pt Will Perform Lower Body Dressing: with min assist Pt Will Transfer to Toilet: with supervision  Plan Discharge plan remains appropriate    Co-evaluation                 AM-PAC OT "6 Clicks" Daily Activity     Outcome Measure   Help from another person eating meals?: A Little Help from another person taking care of personal grooming?: A Lot Help from another person toileting, which includes using toliet, bedpan, or urinal?: A Lot Help from another person bathing (including washing, rinsing, drying)?: A  Lot Help from another person to put on and taking off regular upper body clothing?: A Lot Help from another person to put on and taking off regular lower body clothing?: A Lot 6 Click Score: 13    End of Session    OT Visit Diagnosis: Unsteadiness on feet (R26.81);Muscle weakness (generalized) (M62.81);Apraxia (R48.2);Other symptoms and signs involving the nervous system (R29.898);Other symptoms and signs involving cognitive function;Hemiplegia and hemiparesis Hemiplegia - Right/Left: Right   Activity Tolerance Patient tolerated treatment well   Patient Left in bed;with call bell/phone within reach;with bed alarm set;with nursing/sitter in room   Nurse Communication Mobility status;Precautions        Time: 8675-4492 OT Time Calculation (min): 18 min  Charges: OT General Charges $OT Visit: 1 Visit OT Treatments $Cognitive Funtion inital: Initial 15 mins   Jeri Modena, OTR/L  Acute Rehabilitation Services Pager: [phone] Office: [phone] .    Jeri Modena 02/08/2019, 3:00 PM

## 2019-02-08 NOTE — Progress Notes (Addendum)
Reason for consult: Status epilepticus  Subjective: patient is awake, alert. Perseverates. Able to answer some questions correctly, and then start perseverating. LTM running.    WGN:FAOZHY to obtain due to poor mental status  Examination  Vital signs in last 24 hours: Temp:  [98.2 F (36.8 C)-99 F (37.2 C)] 98.5 F (36.9 C) (05/01 0551) Pulse Rate:  [64-81] 75 (05/01 0551) Resp:  [16-20] 20 (05/01 0551) BP: (110-137)/(78-122) 125/82 (05/01 0551) SpO2:  [98 %-100 %] 100 % (05/01 0551)  General: lying in bed CVS: pulse-normal rate and rhythm RS: breathing comfortably Extremities: normal   Neuro: MS: able to state name.  Follows some simple commands intermittently CN: pupils equal and reactive,  EOMI, face symmetric, tongue midline,  Motor: 3/5 strength in right upper extremity and 3/5 strength in right lower extremity.  5 out of 5 strength in left upper and lower extremities. Reflexes: Plantars downgoing bilaterally Coordination: Unable to assess Gait: not tested  Basic Metabolic Panel: Recent Labs  Lab 02/04/19 1816 02/05/19 0407 02/05/19 1502 02/06/19 0452 02/07/19 0937 02/08/19 0339  NA 139  --  140 138 138 138  K 3.3*  --  3.4* 3.6 4.0 4.1  CL 102  --  104 103 106 107  CO2 26  --  22 26 21* 20*  GLUCOSE 154*  --  151* 106* 100* 107*  BUN 7  --  <5* 5* 7 9  CREATININE 0.86  --  0.80 0.69 0.92 1.08  CALCIUM 8.7*  --  9.1 9.1 9.5 9.4  MG  --  2.0  --  1.8 2.0 1.9    CBC: Recent Labs  Lab 02/03/19 1745 02/04/19 0814 02/04/19 1530 02/05/19 0407 02/06/19 0452 02/07/19 0937 02/08/19 0339  WBC 6.5 8.0  --  8.1 6.5 6.5 6.0  NEUTROABS 4.5  --   --   --   --   --   --   HGB 13.7 13.2 12.7* 12.7* 13.5 14.7 14.6  HCT 42.4 40.3 39.7 38.6* 40.8 44.5 43.9  MCV 96.4 93.1  --  93.0 91.5 92.3 92.6  PLT 201 194  --  194 180 200 214     Coagulation Studies: No results for input(s): LABPROT, INR in the last 72 hours.  Imaging Reviewed:   Laurey Morale,  MSN, NP-C [facility] [phone]   Attending Neurologist note to follow:   ASSESSMENT AND PLAN  43 year old male with past medical history of traumatic brain injury status post craniotomy/cranioplasty on the left presenting with general tonic-clonic seizure. Has had no further clinical seizures but has remained aphasic with right hemiparesis.  Patient was placed on long-term EEG monitoring since 4/28-continues to have brief seizures lasting about 1 minute.  Vimpat and Topamax was added improvement in seizure frequency and yesterday afternoon patient had improved clinically.  Reviewing EEG 02/07/2019, patient still continues to have right electrographic seizures approximately 1 seizure every 1-2 hours. On examination this morning, he appeared to be more aphasic compared to yesterday.   EEG  On 5/1 shows evidence of cerebral dysfunction in the left frontal region with potential epileptogenicity. No seizures were recorded during this portion of the recording. The last seizure was 4/30 at 05:19.  Status epilepticus- no longer having seizures  Todd's paresis -improved  Aphasia  Traumatic brain injury  Continue LTM EEG monitoring  Continue Keppra 1 g twice daily Continue Dilantin 100 mg 3 times daily-level yesterday was 20.2 Continue Vimpat 280m BID Started Topamax 2095mBID continue 1 mg  Ativan every 6 hours. Seizure precautions   NEUROHOSPITALIST ADDENDUM Performed a face to face diagnostic evaluation.   I have reviewed the contents of history and physical exam as documented by PA/ARNP/Resident and agree with above documentation.  I have discussed and formulated the above plan as documented. Edits to the note have been made as needed.  Patient's right upper extremity strength is improved, but speech is no better.  Fortunately has EEG has improved and no further seizures are seen on long-term EEG.  I suspect that his current degree of aphasia ( much worse than his  baseline aphasia following TBI)  is due to prolonged postictal state  as well a side effect of multiple seizure medications. I will not make any changes today.  Continue LTM EEG, if no further seizures today will discontinue tomorrow     Karena Addison Aroor MD [facility] [phone]   If 7pm to 7am, please call on call as listed on AMION.

## 2019-02-08 NOTE — Procedures (Signed)
CPT/Type of Study: 75883; 24hr EEG with video Recording Date: 02/07/2019 07:30 - 02/08/2019 Interpreting physician: Izora Ribas, DO  History: This is a 43 year old patient with history of TBI presenting with seizures, undergoing an EEG to evaluate for seizures.   Technical Description: The EEG was performed using standard setting per the guidelines of American Clinical Neurophysiology Society (ACNS).   A minimum of 21 electrodes were placed on scalp according to the International 10-20 or/and 10-10 Systems. Supplemental electrodes were placed as needed. Single EKG electrode was also used to detect cardiac arrhythmia. Patient's behavior was continuously recorded on video simultaneously with EEG. A minimum of 16 channels were used for data display. Each epoch of study was reviewed manually daily and as needed using standard referential and bipolar montages. Computerized quantitative EEG analysis (such as compressed spectral array analysis, trending, automated spike & seizure detection) were used as indicated.   Clinical State: Lethargy Background: The posterior dominant rhythm was recorded up to 8Hz  on the right  Overall Amplitude: Normal Predominant Frequency: Delta on the left  Asymmetry: Yes Sleep background: Stage II with sleep spindles   Abnormalities: Continuous slow lateralized left  Rhythmic or periodic pattern: None  Epileptiform activity: Yes occasional sharp waves left fronto-central maximal Fz, F3  Electrographic Seizure: No Events: No  Breach rhythm: Yes  Reactivity: Yes  Stimulation procedures:  Hyperventilation: Not done Photic stimulation: Not done  Impression: This EEG shows evidence of cerebral dysfunction in the left frontal region with potential epileptogenicity. No seizures were recorded during this portion of the recording. The last seizure was 4/30 at 05:19.

## 2019-02-08 NOTE — Progress Notes (Addendum)
Physical Therapy Treatment Patient Details Name: Travis Briggs. MRN: [id number] DOB: [birth year] Today's Date: 02/08/2019    History of Present Illness Pt is a 43 y/o male admitted secondary to seizure like activity at a bus stop. Per notes, pt with hx of TBI s/p craniotomy.     PT Comments    Pt sitting upright in bed upon arrival, attempting to take off his lines/leads to his EEG. Pt required max redirection throughout. Treatment very limited secondary to pt agitation and cognitive limitations impairing his participation. Pt's RN entering room to administer Ativan and pt was repositioned in bed with three person assistance. Pt remains connected to continuous EEG monitoring at this time. Pt would continue to benefit from skilled physical therapy services at this time while admitted and after d/c to address the below listed limitations in order to improve overall safety and independence with functional mobility.    Follow Up Recommendations  SNF     Equipment Recommendations  Other (comment)    Recommendations for Other Services       Precautions / Restrictions Precautions Precautions: Fall;Other (comment) Precaution Comments: seizure Restrictions Weight Bearing Restrictions: No    Mobility  Bed Mobility Overal bed mobility: Needs Assistance Bed Mobility: Supine to Sit;Sit to Supine     Supine to sit: Min guard Sit to supine: Min guard   General bed mobility comments: pt attempting to exit the bed in the bottom L corner. pt laying down on request. pt total +3 to scoot back to the [facility] to lay supine  Transfers                 General transfer comment: not attempted due to inceased confusion and EEG in place  Ambulation/Gait                 Stairs             Wheelchair Mobility    Modified Rankin (Stroke Patients Only)       Balance Overall balance assessment: Needs assistance Sitting-balance support: Feet supported Sitting  balance-Leahy Scale: Fair                                      Cognition Arousal/Alertness: Awake/alert Behavior During Therapy: WFL for tasks assessed/performed Overall Cognitive Status: Difficult to assess Area of Impairment: Awareness;Orientation                 Orientation Level: Place;Time;Situation         Awareness: Intellectual   General Comments: pt keeps repeating that he needs to leave. pt unable to write his name. pt writing 4, 3, 2, and crossing out each number before writing the next number. pt unable to spell name      Exercises      General Comments General comments (skin integrity, edema, etc.): Pt expressing the need to leave and reports that lunch "sucks".      Pertinent Vitals/Pain Pain Assessment: No/denies pain    Home Living                      Prior Function            PT Goals (current goals can now be found in the care plan section) Acute Rehab PT Goals Patient Stated Goal: unable to state PT Goal Formulation: Patient unable to participate in goal setting Time For Goal Achievement:  02/18/19 Potential to Achieve Goals: Fair Progress towards PT goals: Progressing toward goals    Frequency    Min 3X/week      PT Plan Current plan remains appropriate    Co-evaluation PT/OT/SLP Co-Evaluation/Treatment: Yes Reason for Co-Treatment: To address functional/ADL transfers;For patient/therapist safety;Necessary to address cognition/behavior during functional activity PT goals addressed during session: Mobility/safety with mobility;Balance        AM-PAC PT "6 Clicks" Mobility   Outcome Measure  Help needed turning from your back to your side while in a flat bed without using bedrails?: None Help needed moving from lying on your back to sitting on the side of a flat bed without using bedrails?: None Help needed moving to and from a bed to a chair (including a wheelchair)?: A Little Help needed standing up  from a chair using your arms (e.g., wheelchair or bedside chair)?: A Little Help needed to walk in hospital room?: A Lot Help needed climbing 3-5 steps with a railing? : Total 6 Click Score: 17    End of Session   Activity Tolerance: Treatment limited secondary to agitation Patient left: in bed;with call bell/phone within reach;with bed alarm set Nurse Communication: Mobility status PT Visit Diagnosis: Unsteadiness on feet (R26.81);Other abnormalities of gait and mobility (R26.89);Muscle weakness (generalized) (M62.81);History of falling (Z91.81);Difficulty in walking, not elsewhere classified (R26.2)     Time: 2353-6144 PT Time Calculation (min) (ACUTE ONLY): 18 min  Charges:                        Sherie Don, PT, DPT  Acute Rehabilitation Services Pager [phone] Office [place] 02/08/2019, 4:31 PM

## 2019-02-08 NOTE — Progress Notes (Signed)
  Speech Language Pathology Treatment:    Patient Details Name: Travis Briggs. MRN: [id number] DOB: Feb 23, 1976 Today's Date: 02/08/2019 Time: 6578-4696 SLP Time Calculation (min) (ACUTE ONLY): 22 min  Assessment / Plan / Recommendation Clinical Impression  Pt was notably more alert than during the previous session and his level of participation was therefore improved. He was able to independently count from 1-3 but required phonemic cues for 4-10. He produced the days of the week and months of the year with consistent part-word cues. He demonstrated 80% accuracy with simple yes/no questions and variability in his responses was demonstrated during this session. He achieved 17% accuracy confrontational naming increasing to 96% accuracy with cues. He completed 1-step commands with 100% accuracy but cues were needed to reduce perseveration. SLP will continue to follow.     HPI HPI: Pt is a 43 y.o. 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 EMS was called after bystanders observed patient having a seizure at a local bus stop. When EMS arrived they noted him having tonic-clonic seizure activity. Patient was given 5 mg of Versed IM. In ED he was noted to have right hemiparesis, right gaze preference, and a right dense aphasia with inability to speak.  MRI of the brain revealed bifrontal, left temporal and parietal encephalomalacia with no acute intracranial abnormality identified. EEG was performed and showed evidence of cerebral dysfunction in theleft frontal region with epileptogenicity.Frequent seizures were recorded from the left frontal region lasting on average 1-2 minutes with no apparent clinical signs on video.       SLP Plan  Continue with current plan of care       Recommendations  Diet recommendations: Dysphagia 3 (mechanical soft) Liquids provided  via: Cup;Straw Medication Administration: Whole meds with puree Supervision: Full supervision/cueing for compensatory strategies Compensations: Slow rate;Small sips/bites;Minimize environmental distractions Postural Changes and/or Swallow Maneuvers: Seated upright 90 degrees                Oral Care Recommendations: Oral care BID Follow up Recommendations: Skilled Nursing facility SLP Visit Diagnosis: Aphasia (R47.01) Plan: Continue with current plan of care       Trayven Lumadue I. Hardin Negus, [place], [place] Office number [phone] Pager [phone]               Horton Marshall 02/08/2019, 11:57 AM

## 2019-02-09 DIAGNOSIS — J9601 Acute respiratory failure with hypoxia: Secondary | ICD-10-CM

## 2019-02-09 DIAGNOSIS — J9602 Acute respiratory failure with hypercapnia: Secondary | ICD-10-CM

## 2019-02-09 LAB — CBC
HCT: 41.6 % (ref 39.0–52.0)
Hemoglobin: 13.7 g/dL (ref 13.0–17.0)
MCH: 30.5 pg (ref 26.0–34.0)
MCHC: 32.9 g/dL (ref 30.0–36.0)
MCV: 92.7 fL (ref 80.0–100.0)
Platelets: 214 10*3/uL (ref 150–400)
RBC: 4.49 MIL/uL (ref 4.22–5.81)
RDW: 12.1 % (ref 11.5–15.5)
WBC: 5.1 10*3/uL (ref 4.0–10.5)
nRBC: 0 % (ref 0.0–0.2)

## 2019-02-09 LAB — BASIC METABOLIC PANEL
Anion gap: 10 (ref 5–15)
BUN: 13 mg/dL (ref 6–20)
CO2: 19 mmol/L — ABNORMAL LOW (ref 22–32)
Calcium: 9.1 mg/dL (ref 8.9–10.3)
Chloride: 109 mmol/L (ref 98–111)
Creatinine, Ser: 0.97 mg/dL (ref 0.61–1.24)
GFR calc Af Amer: >60 mL/min (ref >60)
GFR calc non Af Amer: >60 mL/min (ref >60)
Glucose, Bld: 108 mg/dL — ABNORMAL HIGH (ref 70–99)
Potassium: 3.9 mmol/L (ref 3.5–5.1)
Sodium: 138 mmol/L (ref 135–145)

## 2019-02-09 LAB — MAGNESIUM: Magnesium: 2.1 mg/dL (ref 1.7–2.4)

## 2019-02-09 LAB — GLUCOSE, CAPILLARY
Glucose-Capillary: 100 mg/dL — ABNORMAL HIGH (ref 70–99)
Glucose-Capillary: 82 mg/dL (ref 70–99)
Glucose-Capillary: 101 mg/dL — ABNORMAL HIGH (ref 70–99)

## 2019-02-09 NOTE — Progress Notes (Signed)
OT  Note  Patient Details Name: Travis Briggs. MRN: [id number] DOB: 03-21-1976   Cancelled Treatment:     OT order received.  Pt is currently on OT caseload with recommendation for SNF at discharge. Will continue to follow.  Lucille Passy, OTR/L Acute Rehabilitation Services Pager [phone] Office [phone]   Lucille Passy M 02/09/2019, 6:14 PM

## 2019-02-09 NOTE — Progress Notes (Signed)
LTM discontinued. No skin breakdown was seen.

## 2019-02-09 NOTE — Procedures (Signed)
CPT/Type of Study: 86381; 24hr EEG with video Recording Date: 5/1//2020 07:30 - 02/09/2019 at [place], [place]  History: This is a 43 year old patient with history of TBI presenting with seizures, undergoing an EEG to evaluate for seizures.   Technical Description: The EEG was performed using standard setting per the guidelines of American Clinical Neurophysiology Society (ACNS).   A minimum of 21 electrodes were placed on scalp according to the International 10-20 or/and 10-10 Systems. Supplemental electrodes were placed as needed. Single EKG electrode was also used to detect cardiac arrhythmia. Patient's behavior was continuously recorded on video simultaneously with EEG. A minimum of 16 channels were used for data display. Each epoch of study was reviewed manually daily and as needed using standard referential and bipolar montages. Computerized quantitative EEG analysis (such as compressed spectral array analysis, trending, automated spike & seizure detection) were used as indicated.   Clinical State: awake  Background: The posterior dominant rhythm was recorded up to 8Hz  on the right  Predominant Frequency: Delta on the left  Asymmetry: Yes Sleep background: Stage II with sleep spindles   Abnormalities: Continuous slow lateralized left  Rhythmic or periodic pattern: None  Epileptiform activity: Yes occasional sharp waves left fronto-central maximal Fz, F3  Electrographic Seizure: No Events: No  Breach rhythm: Yes  Reactivity: Yes  Stimulation procedures:  Hyperventilation: Not done Photic stimulation: Not done  Impression: This EEG shows evidence of cerebral dysfunction in the left frontal region with potential epileptogenicity. No seizures were present. . Continuous  monitoring is recommended  if concern for seizures persist. Clinical correlation is advised.

## 2019-02-09 NOTE — Progress Notes (Addendum)
Reason for consult: Status epilepticus  Subjective: patient awake, alert, NAD. LTM running. He still perseverates at times.   UMP:NTIRWE to obtain due to poor mental status  Examination  Vital signs in last 24 hours: Temp:  [97.5 F (36.4 C)-98.7 F (37.1 C)] 98 F (36.7 C) (05/02 0736) Pulse Rate:  [70-89] 70 (05/02 0736) Resp:  [18-20] 18 (05/02 0736) BP: (108-147)/(75-91) 116/83 (05/02 0736) SpO2:  [96 %-100 %] 96 % (05/02 0736) Weight:  [73.1 kg] 73.1 kg (05/02 0305)  General: lying in bed CVS: pulse-normal rate and rhythm RS: breathing comfortably Extremities: normal   Neuro: MS: able to state name.  Follows some simple commands intermittently CN: pupils equal and reactive,  EOMI, face symmetric, tongue midline,  Motor: 3/5 strength in right upper extremity and 3/5 strength in right lower extremity.  5 out of 5 strength in left upper and lower extremities. Reflexes: Plantars downgoing bilaterally Coordination: Unable to assess Gait: not tested  Basic Metabolic Panel: Recent Labs  Lab 02/05/19 0407 02/05/19 1502 02/06/19 0452 02/07/19 0937 02/08/19 0339 02/09/19 0426  NA  --  140 138 138 138 138  K  --  3.4* 3.6 4.0 4.1 3.9  CL  --  104 103 106 107 109  CO2  --  22 26 21* 20* 19*  GLUCOSE  --  151* 106* 100* 107* 108*  BUN  --  <5* 5* 7 9 13   CREATININE  --  0.80 0.69 0.92 1.08 0.97  CALCIUM  --  9.1 9.1 9.5 9.4 9.1  MG 2.0  --  1.8 2.0 1.9 2.1    CBC: Recent Labs  Lab 02/03/19 1745  02/05/19 0407 02/06/19 0452 02/07/19 0937 02/08/19 0339 02/09/19 0426  WBC 6.5   < > 8.1 6.5 6.5 6.0 5.1  NEUTROABS 4.5  --   --   --   --   --   --   HGB 13.7   < > 12.7* 13.5 14.7 14.6 13.7  HCT 42.4   < > 38.6* 40.8 44.5 43.9 41.6  MCV 96.4   < > 93.0 91.5 92.3 92.6 92.7  PLT 201   < > 194 180 200 214 214   < > = values in this interval not displayed.     Imaging No new imaging  Laurey Morale, MSN, NP-C [facility] [phone]   Attending Neurologist note to follow:   ASSESSMENT AND PLAN  43 year old male with past medical history of traumatic brain injury status post craniotomy/cranioplasty on the left presenting with general tonic-clonic seizure. Has had no further clinical seizures but has remained aphasic with right hemiparesis.  Patient was placed on long-term EEG monitoring since 4/28-continues to have brief seizures lasting about 1 minute.  Vimpat and Topamax was added improvement in seizure frequency and yesterday afternoon patient had improved clinically.  Reviewing EEG 02/07/2019, patient still continues to have right electrographic seizures approximately 1 seizure every 1-2 hours. On examination this morning, he appeared to be more aphasic compared to yesterday.   cEEG  On 5/1 and 5/2 shows evidence of cerebral dysfunction in the left frontal region with potential epileptogenicity. No seizures were recorded during this portion of the recording. The last seizure was 4/30 at 05:19.  Status epilepticus- resolved  Todd's paresis -improved  Aphasia  Traumatic brain injury  Continue LTM EEG monitoring ( pending today's read) Continue Keppra 1 g twice daily Continue Dilantin 100 mg 3 times daily-level yesterday was 20.2 Continue Vimpat 215m BID  Started Topamax 258m BID continue 1 mg Ativan every 6 hours. Seizure precautions   NEUROHOSPITALIST ADDENDUM Performed a face to face diagnostic evaluation.   I have reviewed the contents of history and physical exam as documented by PA/ARNP/Resident and agree with above documentation.  I have discussed and formulated the above plan as documented. Edits to the note have been made as needed.  Patient is more talkative today, although his sentences did not necessarily make any sense.  Right hemiparesis also has improved.  Continuous EEG did not show any seizures.  Will continue same medications for now, and then I think we can  gradually start weaning Ativan.   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

## 2019-02-09 NOTE — Progress Notes (Signed)
PROGRESS NOTE    Travis Briggs.  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  It is unknown at this time whether patient has any additional past medical history.  They were called by an observer at a local bus stop informing them that the patient was having a seizure.  Upon arrival, EMS found the patient unattended having tonic-clonic seizure activity.  The individual who called EMS from the bus stop was no longer present on EMS arrival.  He was given 5 mg Versed IM.  In the ED, patient noted to have right hemiparesis, right gaze preference, and dense aphasia with inability to speak.  He had 2 more episodes of witnessed seizure activity in the ED.  Received a total of 4 mg Ativan, 1000 mg IV Keppra, and 1450 mg of IV fosphenytoin. Patient is very somnolent and nonverbal.  No history could be obtained from him.     Subjective: 5/2 alert, follows some commands, perseveration.   Assessment & Plan:   Principal Problem:   Status epilepticus ([place]) Active Problems:   Dental cyst   Stroke (cerebrum) (HCC)   Thrombocytopenia (HCC)  Status epilepticus -Multiple seizures prior to presentation: Residual RIGHT sided weakness (Todd's paralysis?) -4/30 per neurology note patient continues to have frequent subclinical seizures without return to baseline -5/2 EEG shows no seizures see results below.  However patient's mentation does not appear to have improved new baseline?  Iatrogenic from over medication?.  Will await neurology recommendations. -Lacosamide 200 mg twice daily -Keppra 1500 mg twice daily -Ativan 1 mg QID -Ativan as needed -Phenytoin 100 mg every 8 hours -Topamax 200 mg twice daily   Hx TBI -S/p  craniectomy and cranioplasty -Baseline, see acute encephalopathy  Acute encephalopathy -Patient's baseline:- Per chart at admission patient was discussed with his Sister Travis Briggs at baseline has poor memory will forget his name birthday.  Has word finding difficulty, aphasia.  Is supposed to wear helmet - will need to order cranial helmet -Sister Travis Briggs can be reache at [phone].   Nausea/vomiting -Resolved      Hiccups:  -Reglan prn   Dental cyst -CT showing cystic lesion involving the right mandible which is associated with the right lower premolar dental root.  This is thought to be most likely a dental cyst which could be due to a radicular cyst or chronic infection.  Patient is afebrile and does not have leukocytosis. -He will need dentistry follow-up.  Thrombocytopenia -Improving continue to monitor closely  Goals of care  - 5/2 PT/OT consult eval for placement to SNF -5/2 consult placed to social work: SNF placement    DVT prophylaxis: SCD (low platelets) Code Status: Full Family Communication: None Disposition Plan: TBD   Consultants:  Neurology    Procedures/Significant Events:  4/28 VCB:SWHQPRFF to severe left hemispheric slowing is observed.  This can be associated with cerebral disturbance and/or epileptic focus. 2.  Multiple episodes of frontocentral epileptiform discharges. 3.  Single episode of electrographic seizures emanating from the left frontocentral region. 4/29 24-hour MBW:GYKZLDJT dysfunction in the left frontal region with epileptogenicity. Frequent seizures were recorded from the left frontal region lasting on average 1-2 minutes with no apparent clinical signs on video. The event  button pushes did not correlate with electrographic seizures.  4/30 FEO:FHQRFXJO of cerebral dysfunction in the left frontal region with potential epileptogenicity. No seizures were recorded during this portion of the recording. The last seizure was 4/30 at 05:19  5/2  EEG:-Evidence of cerebral dysfunction in the left frontal region with potential epileptogenicity. -No seizures were present. . Continuous  monitoring is recommended  if concern for seizures persist.    I have personally reviewed and interpreted all radiology studies and my findings are as above.  VENTILATOR SETTINGS:    Cultures   Antimicrobials: Anti-infectives (From admission, onward)   None       Devices    LINES / TUBES:     Continuous Infusions: . lacosamide (VIMPAT) IV 200 mg (02/08/19 2132)  . levETIRAcetam 1,500 mg (02/09/19 0610)     Objective: Vitals:   02/08/19 1904 02/08/19 2305 02/09/19 0305 02/09/19 0736  BP: 116/88 112/80 108/75 116/83  Pulse: 84 80 78 70  Resp:  20 18 18   Temp: 98.2 F (36.8 C) 98.5 F (36.9 C) 98.1 F (36.7 C) 98 F (36.7 C)  TempSrc: Oral Oral Oral Oral  SpO2: 99% 99% 100% 96%  Weight:   73.1 kg   Height:        Intake/Output Summary (Last 24 hours) at 02/09/2019 0901 Last data filed at 02/09/2019 0305 Gross per 24 hour  Intake 680 ml  Output 575 ml  Net 105 ml   Filed Weights   02/03/19 1642 02/09/19 0305  Weight: 72.6 kg 73.1 kg    General:/O x1 (does not know where, when, why) perseveration, no acute respiratory distress Eyes: negative scleral hemorrhage, negative anisocoria, negative icterus ENT: Negative Runny nose, negative gingival bleeding, Neck:  Negative scars, masses, torticollis, lymphadenopathy, JVD Lungs: Clear to auscultation bilaterally without wheezes or crackles Cardiovascular: Regular rate and rhythm without murmur gallop or rub normal S1 and S2 Abdomen: negative abdominal pain, nondistended, positive soft, bowel sounds, no rebound, no ascites, no appreciable mass Extremities: No significant cyanosis, clubbing, or edema bilateral lower extremities Skin: Negative rashes, lesions, ulcers Psychiatric: Able to evaluate secondary to patient's altered mental status  Central nervous system:  Cranial  nerves II through XII intact, tongue/uvula midline, all extremities muscle strength 5/5, sensation intact throughout, unable to perform finger nose finger bilateral or quick finger touch bilateral.  negative dysarthria, positive expressive aphasia, negative receptive aphasia.        Data Reviewed: Care during the described time interval was provided by me .  I have reviewed this patient's available data, including medical history, events of note, physical examination, and all test results as part of my evaluation.   CBC: Recent Labs  Lab 02/03/19 1745  02/05/19 0407 02/06/19 0452 02/07/19 0937 02/08/19 0339 02/09/19 0426  WBC 6.5   < > 8.1 6.5 6.5 6.0 5.1  NEUTROABS 4.5  --   --   --   --   --   --   HGB 13.7   < > 12.7* 13.5 14.7 14.6 13.7  HCT 42.4   < > 38.6* 40.8 44.5 43.9 41.6  MCV 96.4   < > 93.0 91.5 92.3 92.6 92.7  PLT 201   < > 194 180 200 214 214   < > = values in this interval not displayed.   Basic Metabolic Panel: Recent Labs  Lab 02/05/19 0407 02/05/19 1502 02/06/19 0452 02/07/19 0937 02/08/19 0339 02/09/19 0426  NA  --  140 138 138 138 138  K  --  3.4* 3.6 4.0 4.1 3.9  CL  --  104 103 106 107 109  CO2  --  22 26 21* 20* 19*  GLUCOSE  --  151* 106* 100* 107* 108*  BUN  --  <5* 5* 7 9 13   CREATININE  --  0.80 0.69 0.92 1.08 0.97  CALCIUM  --  9.1 9.1 9.5 9.4 9.1  MG 2.0  --  1.8 2.0 1.9 2.1   GFR: Estimated Creatinine Clearance: 97.2 mL/min (by C-G formula based on SCr of 0.97 mg/dL). Liver Function Tests: Recent Labs  Lab 02/03/19 1745 02/04/19 0814 02/04/19 1816 02/05/19 1502 02/06/19 0452  AST 22 21 20 19 19   ALT 31 29 29 25 29   ALKPHOS 89 79 84 80 84  BILITOT 0.6 0.6 0.5 0.3 0.6  PROT 8.1 6.6 6.5 6.3* 6.5  ALBUMIN 4.9 3.9 3.9 3.7 3.7   No results for input(s): LIPASE, AMYLASE in the last 168 hours. No results for input(s): AMMONIA in the last 168 hours. Coagulation Profile: Recent Labs  Lab 02/03/19 1745  INR 1.0   Cardiac  Enzymes: No results for input(s): CKTOTAL, CKMB, CKMBINDEX, TROPONINI in the last 168 hours. BNP (last 3 results) No results for input(s): PROBNP in the last 8760 hours. HbA1C: No results for input(s): HGBA1C in the last 72 hours. CBG: Recent Labs  Lab 02/08/19 1148 02/08/19 1600 02/08/19 2318 02/09/19 0313 02/09/19 0816  GLUCAP 92 110* 109* 100* 82   Lipid Profile: No results for input(s): CHOL, HDL, LDLCALC, TRIG, CHOLHDL, LDLDIRECT in the last 72 hours. Thyroid Function Tests: No results for input(s): TSH, T4TOTAL, FREET4, T3FREE, THYROIDAB in the last 72 hours. Anemia Panel: No results for input(s): VITAMINB12, FOLATE, FERRITIN, TIBC, IRON, RETICCTPCT in the last 72 hours. Urine analysis: No results found for: COLORURINE, APPEARANCEUR, LABSPEC, PHURINE, GLUCOSEU, HGBUR, BILIRUBINUR, KETONESUR, PROTEINUR, UROBILINOGEN, NITRITE, LEUKOCYTESUR Sepsis Labs: @LABRCNTIP (procalcitonin:4,lacticidven:4)  ) Recent Results (from the past 240 hour(s))  SARS Coronavirus 2 [facility] order, Performed in [place] hospital lab)     Status: None   Collection Time: 02/03/19  7:46 PM  Result Value Ref Range Status   SARS Coronavirus 2 NEGATIVE NEGATIVE Final    Comment: (NOTE) If result is NEGATIVE SARS-CoV-2 target nucleic acids are NOT DETECTED. The SARS-CoV-2 RNA is generally detectable in upper and lower  respiratory specimens during the acute phase of infection. The lowest  concentration of SARS-CoV-2 viral copies this assay can detect is 250  copies / mL. A negative result does not preclude SARS-CoV-2 infection  and should not be used as the sole basis for treatment or other  patient management decisions.  A negative result may occur with  improper specimen collection / handling, submission of specimen other  than nasopharyngeal swab, presence of viral mutation(s) within the  areas targeted by this assay, and inadequate number of viral copies  (<250 copies / mL). A negative  result must be combined with clinical  observations, patient history, and epidemiological information. If result is POSITIVE SARS-CoV-2 target nucleic acids are DETECTED. The SARS-CoV-2 RNA is generally detectable in upper and lower  respiratory specimens dur ing the acute phase of infection.  Positive  results are indicative of active infection with SARS-CoV-2.  Clinical  correlation with patient history and other diagnostic information is  necessary to determine patient infection status.  Positive results do  not rule out bacterial infection or co-infection with other viruses. If result is PRESUMPTIVE POSTIVE SARS-CoV-2 nucleic acids MAY  BE PRESENT.   A presumptive positive result was obtained on the submitted specimen  and confirmed on repeat testing.  While 2019 novel coronavirus  (SARS-CoV-2) nucleic acids may be present in the submitted sample  additional confirmatory testing may be necessary for epidemiological  and / or clinical management purposes  to differentiate between  SARS-CoV-2 and other Sarbecovirus currently known to infect humans.  If clinically indicated additional testing with an alternate test  methodology [phone]) is advised. The SARS-CoV-2 RNA is generally  detectable in upper and lower respiratory sp ecimens during the acute  phase of infection. The expected result is Negative. Fact Sheet for Patients:  StrictlyIdeas.no Fact Sheet for Healthcare Providers: BankingDealers.co.za This test is not yet approved or cleared by the [place] FDA and has been authorized for detection and/or diagnosis of SARS-CoV-2 by FDA under an Emergency Use Authorization (EUA).  This EUA will remain in effect (meaning this test can be used) for the duration of the COVID-19 declaration under Section 564(b)(1) of the Act, 21 U.S.C. section 360bbb-3(b)(1), unless the authorization is terminated or revoked sooner. Performed at [facility], [place] [address], [place], [place] [address]   MRSA PCR Screening     Status: None   Collection Time: 02/04/19 11:27 AM  Result Value Ref Range Status   MRSA by PCR NEGATIVE NEGATIVE Final    Comment:        The GeneXpert MRSA Assay (FDA approved for NASAL specimens only), is one component of a comprehensive MRSA colonization surveillance program. It is not intended to diagnose MRSA infection nor to guide or monitor treatment for MRSA infections. Performed at [facility], [place] [address] [place], [place] [address]          Radiology Studies: No results found.      Scheduled Meds: . LORazepam  1 mg Intravenous Q6H  . pantoprazole  40 mg Oral Daily  . phenytoin (DILANTIN) IV  100 mg Intravenous Q8H  . topiramate  200 mg Oral BID   Continuous Infusions: . lacosamide (VIMPAT) IV 200 mg (02/08/19 2132)  . levETIRAcetam 1,500 mg (02/09/19 0610)     LOS: 5 days   Time spent: 40 minutes     WOODS, Geraldo Docker, MD [facility] Pager [phone]  If 7PM-7AM, please contact night-coverage [email or web address] Password [email or web address] 02/09/2019, 9:01 AM

## 2019-02-10 LAB — CBC
HCT: 40.2 % (ref 39.0–52.0)
Hemoglobin: 13.5 g/dL (ref 13.0–17.0)
MCH: 31.1 pg (ref 26.0–34.0)
MCHC: 33.6 g/dL (ref 30.0–36.0)
MCV: 92.6 fL (ref 80.0–100.0)
Platelets: 225 10*3/uL (ref 150–400)
RBC: 4.34 MIL/uL (ref 4.22–5.81)
RDW: 12.2 % (ref 11.5–15.5)
WBC: 7.3 10*3/uL (ref 4.0–10.5)
nRBC: 0 % (ref 0.0–0.2)

## 2019-02-10 LAB — MAGNESIUM: Magnesium: 2.1 mg/dL (ref 1.7–2.4)

## 2019-02-10 LAB — BASIC METABOLIC PANEL
Anion gap: 8 (ref 5–15)
BUN: 13 mg/dL (ref 6–20)
CO2: 21 mmol/L — ABNORMAL LOW (ref 22–32)
Calcium: 9.2 mg/dL (ref 8.9–10.3)
Chloride: 110 mmol/L (ref 98–111)
Creatinine, Ser: 1.01 mg/dL (ref 0.61–1.24)
GFR calc Af Amer: >60 mL/min (ref >60)
GFR calc non Af Amer: >60 mL/min (ref >60)
Glucose, Bld: 108 mg/dL — ABNORMAL HIGH (ref 70–99)
Potassium: 4 mmol/L (ref 3.5–5.1)
Sodium: 139 mmol/L (ref 135–145)

## 2019-02-10 LAB — GLUCOSE, CAPILLARY
Glucose-Capillary: 118 mg/dL — ABNORMAL HIGH (ref 70–99)
Glucose-Capillary: 96 mg/dL (ref 70–99)

## 2019-02-10 MED ORDER — ACETAMINOPHEN 325 MG PO TABS
650.0000 mg | ORAL_TABLET | Freq: Four times a day (QID) | ORAL | Status: DC | PRN
  Administered 2019-02-10 – 2019-02-15 (×2): 650 mg via ORAL
  Filled 2019-02-10 (×3): qty 2

## 2019-02-10 MED ORDER — ACETAMINOPHEN 650 MG RE SUPP
650.0000 mg | Freq: Four times a day (QID) | RECTAL | Status: DC | PRN
  Administered 2019-02-11: 650 mg via RECTAL
  Filled 2019-02-10: qty 1

## 2019-02-10 MED ORDER — LORAZEPAM 2 MG/ML IJ SOLN
1.0000 mg | Freq: Two times a day (BID) | INTRAMUSCULAR | Status: DC
  Administered 2019-02-10: 1 mg via INTRAVENOUS
  Filled 2019-02-10: qty 1

## 2019-02-10 NOTE — TOC Progression Note (Signed)
Transition of Care (TOC) - Progression Note    Patient Details  Name: Travis Briggs. MRN: [id number] Date of Birth: 06-01-1976  Transition of Care [facility]) CM/SW [place], [place] Phone Number: 02/10/2019, 8:44 AM  Clinical Narrative:     CSW called and spoke with on-call supervisor, Vito Backers, at [facility]. Cassandra stated that residents of [facility] usually have to go to rehab at another facility then return back. She stated that they do not have rehab within their facility.   CSW will work the patient up for skilled nursing and assist with discharge planning.    Expected Discharge Plan: Assisted Living Barriers to Discharge: Continued Medical Work up, Other (comment)(pt initially doesnt know identity--old TBI)  Expected Discharge Plan and Services Expected Discharge Plan: Assisted Living       Living arrangements for the past 2 months: Assisted Living Facility                                       Social Determinants of Health (SDOH) Interventions    Readmission Risk Interventions No flowsheet data found.

## 2019-02-10 NOTE — Progress Notes (Signed)
PROGRESS NOTE    [name]  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  It is unknown at this time whether patient has any additional past medical history.  They were called by an observer at a local bus stop informing them that the patient was having a seizure.  Upon arrival, EMS found the patient unattended having tonic-clonic seizure activity.  The individual who called EMS from the bus stop was no longer present on EMS arrival.  He was given 5 mg Versed IM.  In the ED, patient noted to have right hemiparesis, right gaze preference, and dense aphasia with inability to speak.  He had 2 more episodes of witnessed seizure activity in the ED.  Received a total of 4 mg Ativan, 1000 mg IV Keppra, and 1450 mg of IV fosphenytoin. Patient is very somnolent and nonverbal.  No history could be obtained from him.     Subjective: 5/3 alert, follows commands, perseveration  Assessment & Plan:   Principal Problem:   Status epilepticus ([place]) Active Problems:   Dental cyst   Stroke (cerebrum) (HCC)   Thrombocytopenia (HCC)  Status epilepticus -Multiple seizures prior to presentation: Residual RIGHT sided weakness (Todd's paralysis?) -4/30 per neurology note patient continues to have frequent subclinical seizures without return to baseline -5/2 EEG shows no seizures see results below.  However patient's mentation does not appear to have improved new baseline?  Iatrogenic from over medication?.  Will await neurology recommendations. -Lacosamide 200 mg twice daily -Keppra 1500 mg twice daily (neurology reporting Keppra as 1000 mg twice daily clarify in the a.m. what dose they would like patient on as they have  already decreased Ativan) -5/3 decrease Ativan 1 mg twice daily -Ativan as needed -Phenytoin 100 mg every 8 hours -Topamax 200 mg twice daily   Hx TBI -S/p craniectomy and cranioplasty -Baseline, see acute encephalopathy  Acute encephalopathy -Patient's baseline:- Per chart at admission patient was discussed with his Sister [name] at baseline has poor memory will forget his name birthday.  Has word finding difficulty, aphasia.  Is supposed to wear helmet - will need to order cranial helmet -Sister [name] can be reache at [phone].   Nausea/vomiting -Resolved      Hiccups:  -Reglan prn   Dental cyst -CT showing cystic lesion involving the right mandible which is associated with the right lower premolar dental root.  This is thought to be most likely a dental cyst which could be due to a radicular cyst or chronic infection.  Patient is afebrile and does not have leukocytosis. -He will need dentistry follow-up.  Thrombocytopenia -Improving continue to monitor closely  Goals of care  - 5/2 PT/OT consult eval for placement to SNF -5/2 consult placed to social work: SNF placement    DVT prophylaxis: SCD (low platelets) Code Status: Full Family Communication: None Disposition Plan: Neuro SNF.  Social work has begun work on Immunologist:  Neurology    Procedures/Significant Events:  4/28 MVV:KPQAESLP to severe left hemispheric slowing is observed.  This can be associated with cerebral disturbance and/or epileptic focus. 2.  Multiple episodes of frontocentral epileptiform discharges. 3.  Single episode of electrographic seizures emanating from the left frontocentral  region. 4/29 24-hour YHC:WCBJSEGB dysfunction in the left frontal region with epileptogenicity. Frequent seizures were recorded from the left frontal region lasting on average 1-2 minutes with no apparent clinical signs on video. The event button pushes did not correlate with electrographic seizures.   4/30 TDV:VOHYWVPX of cerebral dysfunction in the left frontal region with potential epileptogenicity. No seizures were recorded during this portion of the recording. The last seizure was 4/30 at 05:19  5/2 EEG:-Evidence of cerebral dysfunction in the left frontal region with potential epileptogenicity. -No seizures were present. . Continuous  monitoring is recommended  if concern for seizures persist.    I have personally reviewed and interpreted all radiology studies and my findings are as above.  VENTILATOR SETTINGS:    Cultures   Antimicrobials: Anti-infectives (From admission, onward)   None       Devices    LINES / TUBES:     Continuous Infusions: . lacosamide (VIMPAT) IV 200 mg (02/09/19 2140)  . levETIRAcetam 1,500 mg (02/10/19 0515)     Objective: Vitals:   02/10/19 0047 02/10/19 0354 02/10/19 0354 02/10/19 0737  BP: 108/65 (!) 125/91  119/84  Pulse: 67  (!) 58 78  Resp:    16  Temp: 98.1 F (36.7 C)  97.9 F (36.6 C) 98.5 F (36.9 C)  TempSrc: Oral  Oral Oral  SpO2: 100%  100% 100%  Weight:      Height:        Intake/Output Summary (Last 24 hours) at 02/10/2019 0813 Last data filed at 02/09/2019 1816 Gross per 24 hour  Intake 289.73 ml  Output 500 ml  Net -210.27 ml   Filed Weights   02/03/19 1642 02/09/19 0305  Weight: 72.6 kg 73.1 kg   General: A/O x1 (does not know where, when, why) perseveration, no acute respiratory distress Eyes: negative scleral hemorrhage, negative anisocoria, negative icterus ENT: Negative Runny nose, negative gingival bleeding, Neck:  Negative scars, masses, torticollis, lymphadenopathy, JVD Lungs: Clear to auscultation bilaterally without wheezes or crackles Cardiovascular: Regular rate and rhythm without murmur gallop or rub normal S1 and S2 Abdomen: negative abdominal pain, nondistended, positive soft, bowel sounds, no rebound, no ascites, no appreciable mass Extremities: No significant cyanosis, clubbing, or  edema bilateral lower extremities Skin: Negative rashes, lesions, ulcers Psychiatric: Unable to assess secondary to mental status change   Central nervous system:  Cranial nerves II through XII intact, tongue/uvula midline, all extremities muscle strength 5/5, sensation intact throughout, negative dysarthria, positive expressive aphasia, negative receptive aphasia.  Perseveration          Data Reviewed: Care during the described time interval was provided by me .  I have reviewed this patient's available data, including medical history, events of note, physical examination, and all test results as part of my evaluation.   CBC: Recent Labs  Lab 02/03/19 1745  02/06/19 0452 02/07/19 0937 02/08/19 0339 02/09/19 0426 02/10/19 0444  WBC 6.5   < > 6.5 6.5 6.0 5.1 7.3  NEUTROABS 4.5  --   --   --   --   --   --   HGB 13.7   < > 13.5 14.7 14.6 13.7 13.5  HCT 42.4   < > 40.8 44.5 43.9 41.6 40.2  MCV 96.4   < > 91.5 92.3 92.6 92.7 92.6  PLT 201   < > 180 200 214 214 225   < > = values in this interval not displayed.   Basic Metabolic Panel: Recent Labs  Lab 02/06/19 0452 02/07/19 0937 02/08/19 0339 02/09/19 0426 02/10/19 0444  NA 138 138 138 138 139  K 3.6 4.0 4.1 3.9 4.0  CL 103 106 107 109 110  CO2 26 21* 20* 19* 21*  GLUCOSE 106* 100* 107* 108* 108*  BUN 5* 7 9 13 13   CREATININE 0.69 0.92 1.08 0.97 1.01  CALCIUM 9.1 9.5 9.4 9.1 9.2  MG 1.8 2.0 1.9 2.1 2.1   GFR: Estimated Creatinine Clearance: 93.3 mL/min (by C-G formula based on SCr of 1.01 mg/dL). Liver Function Tests: Recent Labs  Lab 02/03/19 1745 02/04/19 0814 02/04/19 1816 02/05/19 1502 02/06/19 0452  AST 22 21 20 19 19   ALT 31 29 29 25 29   ALKPHOS 89 79 84 80 84  BILITOT 0.6 0.6 0.5 0.3 0.6  PROT 8.1 6.6 6.5 6.3* 6.5  ALBUMIN 4.9 3.9 3.9 3.7 3.7   No results for input(s): LIPASE, AMYLASE in the last 168 hours. No results for input(s): AMMONIA in the last 168 hours. Coagulation Profile: Recent Labs   Lab 02/03/19 1745  INR 1.0   Cardiac Enzymes: No results for input(s): CKTOTAL, CKMB, CKMBINDEX, TROPONINI in the last 168 hours. BNP (last 3 results) No results for input(s): PROBNP in the last 8760 hours. HbA1C: No results for input(s): HGBA1C in the last 72 hours. CBG: Recent Labs  Lab 02/08/19 2318 02/09/19 0313 02/09/19 0816 02/09/19 1920 02/10/19 0735  GLUCAP 109* 100* 82 101* 96   Lipid Profile: No results for input(s): CHOL, HDL, LDLCALC, TRIG, CHOLHDL, LDLDIRECT in the last 72 hours. Thyroid Function Tests: No results for input(s): TSH, T4TOTAL, FREET4, T3FREE, THYROIDAB in the last 72 hours. Anemia Panel: No results for input(s): VITAMINB12, FOLATE, FERRITIN, TIBC, IRON, RETICCTPCT in the last 72 hours. Urine analysis: No results found for: COLORURINE, APPEARANCEUR, LABSPEC, PHURINE, GLUCOSEU, HGBUR, BILIRUBINUR, KETONESUR, PROTEINUR, UROBILINOGEN, NITRITE, LEUKOCYTESUR Sepsis Labs: @LABRCNTIP (procalcitonin:4,lacticidven:4)  ) Recent Results (from the past 240 hour(s))  SARS Coronavirus 2 [facility] order, Performed in [place] hospital lab)     Status: None   Collection Time: 02/03/19  7:46 PM  Result Value Ref Range Status   SARS Coronavirus 2 NEGATIVE NEGATIVE Final    Comment: (NOTE) If result is NEGATIVE SARS-CoV-2 target nucleic acids are NOT DETECTED. The SARS-CoV-2 RNA is generally detectable in upper and lower  respiratory specimens during the acute phase of infection. The lowest  concentration of SARS-CoV-2 viral copies this assay can detect is 250  copies / mL. A negative result does not preclude SARS-CoV-2 infection  and should not be used as the sole basis for treatment or other  patient management decisions.  A negative result may occur with  improper specimen collection / handling, submission of specimen other  than nasopharyngeal swab, presence of viral mutation(s) within the  areas targeted by this assay, and inadequate number of viral  copies  (<250 copies / mL). A negative result must be combined with clinical  observations, patient history, and epidemiological information. If result is POSITIVE SARS-CoV-2 target nucleic acids are DETECTED. The SARS-CoV-2 RNA is generally detectable in upper and lower  respiratory specimens dur ing the acute phase of infection.  Positive  results are indicative of active infection with SARS-CoV-2.  Clinical  correlation with patient history and other diagnostic information is  necessary to determine patient infection status.  Positive results do  not rule out bacterial infection or co-infection with other viruses. If result is PRESUMPTIVE POSTIVE SARS-CoV-2 nucleic acids MAY BE PRESENT.   A  presumptive positive result was obtained on the submitted specimen  and confirmed on repeat testing.  While 2019 novel coronavirus  (SARS-CoV-2) nucleic acids may be present in the submitted sample  additional confirmatory testing may be necessary for epidemiological  and / or clinical management purposes  to differentiate between  SARS-CoV-2 and other Sarbecovirus currently known to infect humans.  If clinically indicated additional testing with an alternate test  methodology [id number]) is advised. The SARS-CoV-2 RNA is generally  detectable in upper and lower respiratory sp ecimens during the acute  phase of infection. The expected result is Negative. Fact Sheet for Patients:  StrictlyIdeas.no Fact Sheet for Healthcare Providers: BankingDealers.co.za This test is not yet approved or cleared by the [place] FDA and has been authorized for detection and/or diagnosis of SARS-CoV-2 by FDA under an Emergency Use Authorization (EUA).  This EUA will remain in effect (meaning this test can be used) for the duration of the COVID-19 declaration under Section 564(b)(1) of the Act, 21 U.S.C. section 360bbb-3(b)(1), unless the authorization is terminated  or revoked sooner. Performed at [facility], [place] [address] [place], [place] [address]   MRSA PCR Screening     Status: None   Collection Time: 02/04/19 11:27 AM  Result Value Ref Range Status   MRSA by PCR NEGATIVE NEGATIVE Final    Comment:        The GeneXpert MRSA Assay (FDA approved for NASAL specimens only), is one component of a comprehensive MRSA colonization surveillance program. It is not intended to diagnose MRSA infection nor to guide or monitor treatment for MRSA infections. Performed at [facility], [place] [address], [place], [place] [address]          Radiology Studies: No results found.      Scheduled Meds: . LORazepam  1 mg Intravenous Q6H  . pantoprazole  40 mg Oral Daily  . phenytoin (DILANTIN) IV  100 mg Intravenous Q8H  . topiramate  200 mg Oral BID   Continuous Infusions: . lacosamide (VIMPAT) IV 200 mg (02/09/19 2140)  . levETIRAcetam 1,500 mg (02/10/19 0515)     LOS: 6 days   Time spent: 40 minutes     Isabeau Mccalla, Geraldo Docker, MD [facility] Pager [phone]  If 7PM-7AM, please contact night-coverage [email or web address] Password [facility] 02/10/2019, 8:13 AM

## 2019-02-10 NOTE — Progress Notes (Signed)
Reason for consult: Status epilepticus  Subjective: Patient speech is improved today.  Trying to conversate in sentences, some of them make sense.   ROS: negative except above, limited to due to aphasia  Examination  Vital signs in last 24 hours: Temp:  [97.8 F (36.6 C)-99.4 F (37.4 C)] 98.5 F (36.9 C) (05/03 0737) Pulse Rate:  [58-78] 78 (05/03 0737) Resp:  [16-18] 16 (05/03 0737) BP: (108-138)/(65-96) 119/84 (05/03 0737) SpO2:  [97 %-100 %] 100 % (05/03 0737)  General: lying in bed CVS: pulse-normal rate and rhythm RS: breathing comfortably Extremities: normal   Neuro: MS: awake, following commands, aphasic (expressive > receptive) CN: pupils equal and reactive,  EOMI, face symmetric, tongue midline Motor: 4+/ 5 strength in both right upper and right lower extremity, and 5/5 left upper and lower extremity Reflexes:   plantars: flexor Coordination: normal Gait: not tested  Basic Metabolic Panel: Recent Labs  Lab 02/06/19 0452 02/07/19 0937 02/08/19 0339 02/09/19 0426 02/10/19 0444  NA 138 138 138 138 139  K 3.6 4.0 4.1 3.9 4.0  CL 103 106 107 109 110  CO2 26 21* 20* 19* 21*  GLUCOSE 106* 100* 107* 108* 108*  BUN 5* 7 9 13 13   CREATININE 0.69 0.92 1.08 0.97 1.01  CALCIUM 9.1 9.5 9.4 9.1 9.2  MG 1.8 2.0 1.9 2.1 2.1    CBC: Recent Labs  Lab 02/03/19 1745  02/06/19 0452 02/07/19 0937 02/08/19 0339 02/09/19 0426 02/10/19 0444  WBC 6.5   < > 6.5 6.5 6.0 5.1 7.3  NEUTROABS 4.5  --   --   --   --   --   --   HGB 13.7   < > 13.5 14.7 14.6 13.7 13.5  HCT 42.4   < > 40.8 44.5 43.9 41.6 40.2  MCV 96.4   < > 91.5 92.3 92.6 92.7 92.6  PLT 201   < > 180 200 214 214 225   < > = values in this interval not displayed.     Coagulation Studies: No results for input(s): LABPROT, INR in the last 72 hours.  Imaging Reviewed:     ASSESSMENT AND PLAN  43 year old male with past medical history of traumatic brain injury status post craniotomy/cranioplasty on  the left presenting with general tonic-clonic seizure. Has had no further clinical seizures but has remained aphasic with right hemiparesis.  Patient was placed on long-term EEG monitoring since 4/28-continues to have brief seizures lasting about 1 minute.  Vimpat and Topamax was added improvement in seizure frequency and yesterday afternoon patient had improved clinically.  Reviewing EEG 02/07/2019, patient still continues to have right electrographic seizures approximately 1 seizure every 1-2 hours. On examination this morning, he appeared to be more aphasic compared to yesterday.   cEEG  On 5/1 and 5/2 shows evidence of cerebral dysfunction in the left frontal region withpotentialepileptogenicity.No seizures were recorded during this portion of the recording.The last seizure was 4/30 at 05:19.  Status epilepticus- resolved  Todd's paresis -improved  Traumatic brain injury Aphasia at baseline now worsened, Encephalopathy likely due to prolonged status epilepticus, multiple AEDs and Ativan.   Discontinued LTM  Continue Keppra 1 g twice daily Continue Dilantin 100 mg 3 times daily-repeat  Dilantin level today Continue Vimpat 275m BID COntinue Topamax 2046mBID Wean 1 mg Ativan to every q12h Seizure precautions  Goal will be to eventually taper AEDs now that he is no longer having seizures.  However this would need to be done over  weeks to prevent rebound seizures.  Agree with placement and close follow-up with neurologist as outpatient to further taper medications.  Hopefully, he can be seizure-free with just 1-2 medications.  Karena Addison Hortensia Duffin [facility] Pager Number [phone] For questions after 7pm please refer to AMION to reach the Neurologist on call

## 2019-02-10 NOTE — Progress Notes (Signed)
CSW asked if RN could search the patient's belongings for his social security card.   RN found cigars, lighters, marijuana, and questionable wires with copper pieces. Security was notified. The drugs were flushed in the bathroom with RN to witness. Security has the cigars, wires, and lighters in their position downstairs.   Patient not able to provide his social security number. CSW attempted to reach his sister earlier and left a voicemail. CSW is still awaiting a return phone call.   Domenic Schwab, MSW, [place]

## 2019-02-11 ENCOUNTER — Inpatient Hospital Stay (HOSPITAL_COMMUNITY): Payer: Medicaid Other

## 2019-02-11 LAB — GLUCOSE, CAPILLARY
Glucose-Capillary: 95 mg/dL (ref 70–99)
Glucose-Capillary: 126 mg/dL — ABNORMAL HIGH (ref 70–99)
Glucose-Capillary: 125 mg/dL — ABNORMAL HIGH (ref 70–99)
Glucose-Capillary: 105 mg/dL — ABNORMAL HIGH (ref 70–99)
Glucose-Capillary: 111 mg/dL — ABNORMAL HIGH (ref 70–99)

## 2019-02-11 LAB — BASIC METABOLIC PANEL
Anion gap: 10 (ref 5–15)
BUN: 10 mg/dL (ref 6–20)
CO2: 18 mmol/L — ABNORMAL LOW (ref 22–32)
Calcium: 9.2 mg/dL (ref 8.9–10.3)
Chloride: 109 mmol/L (ref 98–111)
Creatinine, Ser: 1.08 mg/dL (ref 0.61–1.24)
GFR calc Af Amer: >60 mL/min (ref >60)
GFR calc non Af Amer: >60 mL/min (ref >60)
Glucose, Bld: 115 mg/dL — ABNORMAL HIGH (ref 70–99)
Potassium: 4 mmol/L (ref 3.5–5.1)
Sodium: 137 mmol/L (ref 135–145)

## 2019-02-11 LAB — CBC
HCT: 41.5 % (ref 39.0–52.0)
HCT: 38.8 % — ABNORMAL LOW (ref 39.0–52.0)
Hemoglobin: 13.7 g/dL (ref 13.0–17.0)
Hemoglobin: 12.9 g/dL — ABNORMAL LOW (ref 13.0–17.0)
MCH: 30.2 pg (ref 26.0–34.0)
MCH: 30.6 pg (ref 26.0–34.0)
MCHC: 33 g/dL (ref 30.0–36.0)
MCHC: 33.2 g/dL (ref 30.0–36.0)
MCV: 91.4 fL (ref 80.0–100.0)
MCV: 91.9 fL (ref 80.0–100.0)
Platelets: 213 10*3/uL (ref 150–400)
Platelets: 202 10*3/uL (ref 150–400)
RBC: 4.54 MIL/uL (ref 4.22–5.81)
RBC: 4.22 MIL/uL (ref 4.22–5.81)
RDW: 12.1 % (ref 11.5–15.5)
RDW: 12.1 % (ref 11.5–15.5)
WBC: 16.1 10*3/uL — ABNORMAL HIGH (ref 4.0–10.5)
WBC: 19.1 10*3/uL — ABNORMAL HIGH (ref 4.0–10.5)
nRBC: 0 % (ref 0.0–0.2)
nRBC: 0 % (ref 0.0–0.2)

## 2019-02-11 LAB — PHENYTOIN LEVEL, TOTAL: Phenytoin Lvl: 15.8 ug/mL (ref 10.0–20.0)

## 2019-02-11 LAB — LACTIC ACID, PLASMA
Lactic Acid, Venous: 0.8 mmol/L (ref 0.5–1.9)
Lactic Acid, Venous: 1.1 mmol/L (ref 0.5–1.9)

## 2019-02-11 LAB — MAGNESIUM: Magnesium: 2 mg/dL (ref 1.7–2.4)

## 2019-02-11 MED ORDER — LORAZEPAM 2 MG/ML IJ SOLN
1.0000 mg | Freq: Once | INTRAMUSCULAR | Status: AC
  Administered 2019-02-11: 1 mg via INTRAVENOUS
  Filled 2019-02-11: qty 1

## 2019-02-11 MED ORDER — SODIUM CHLORIDE 0.9 % IV BOLUS
500.0000 mL | Freq: Once | INTRAVENOUS | Status: AC
  Administered 2019-02-11: 10:00:00 via INTRAVENOUS

## 2019-02-11 MED ORDER — SODIUM CHLORIDE 0.9 % IV SOLN
INTRAVENOUS | Status: DC
  Administered 2019-02-11 – 2019-02-12 (×4): via INTRAVENOUS

## 2019-02-11 MED ORDER — LORAZEPAM 2 MG/ML IJ SOLN
0.5000 mg | Freq: Two times a day (BID) | INTRAMUSCULAR | Status: DC
  Administered 2019-02-11 – 2019-02-13 (×6): 0.5 mg via INTRAVENOUS
  Filled 2019-02-11 (×6): qty 1

## 2019-02-11 MED ORDER — CLINDAMYCIN PHOSPHATE 600 MG/50ML IV SOLN
600.0000 mg | Freq: Three times a day (TID) | INTRAVENOUS | Status: DC
  Administered 2019-02-11 – 2019-02-15 (×11): 600 mg via INTRAVENOUS
  Filled 2019-02-11 (×13): qty 50

## 2019-02-11 NOTE — NC FL2 (Signed)
[place] LEVEL OF CARE SCREENING TOOL     IDENTIFICATION  Patient Name: Travis Briggs. Birthdate: 1975/12/12 Sex: male Admission Date (Current Location): 02/03/2019  [facility] and [place] Number:  Herbalist and Address:  [facility]. [facility], [place] [address], [place], [place] [address]      Provider Number: [id number]  Attending Physician Name and Address:  Jonnie Finner, DO  Relative Name and Phone Number:  Charlann Noss, [phone]    Current Level of Care: Hospital Recommended Level of Care: [place] Prior Approval Number:    Date Approved/Denied:   PASRR Number: [id number]  Discharge Plan: SNF    Current Diagnoses: Patient Active Problem List   Diagnosis Date Noted  . Thrombocytopenia ([place]) 02/08/2019  . Status epilepticus ([place]) 02/04/2019  . Dental cyst 02/04/2019  . Stroke (cerebrum) ([place]) 02/04/2019    Orientation RESPIRATION BLADDER Height & Weight     Self  Normal Incontinent, External catheter Weight: 161 lb 2.5 oz (73.1 kg) Height:  5' 9"  (175.3 cm)  BEHAVIORAL SYMPTOMS/MOOD NEUROLOGICAL BOWEL NUTRITION STATUS  [place] Convulsions/Seizures Continent Diet(Dys 3, thin liquids)  AMBULATORY STATUS COMMUNICATION OF NEEDS Skin   Limited Assist(+1 min assist) Verbally Normal                       Personal Care Assistance Level of Assistance  Bathing, Feeding, Dressing Bathing Assistance: Limited assistance(upper min, lower mod) Feeding assistance: Limited assistance(Needs assistance setting up/eating) Dressing Assistance: Limited assistance(upper min, lower mod) Total Care Assistance: (min assistance)   Functional Limitations Info  Sight, Hearing, Speech Sight Info: Adequate Hearing Info: Adequate Speech Info: Adequate    SPECIAL CARE FACTORS FREQUENCY  PT (By licensed PT), OT (By licensed OT), Speech therapy     PT Frequency: 5/wk OT Frequency: 5x/wk     Speech  Therapy Frequency: 5x/wk      Contractures Contractures Info: Not present    Additional Factors Info  Code Status, Allergies Code Status Info: Full Code Allergies Info: No known allergies           Current Medications (02/11/2019):  This is the current hospital active medication list Current Facility-Administered Medications  Medication Dose Route Frequency Provider Last Rate Last Dose  . acetaminophen (TYLENOL) tablet 650 mg  650 mg Oral Q6H PRN Vertis Kelch, NP   650 mg at 02/10/19 2210   Or  . acetaminophen (TYLENOL) suppository 650 mg  650 mg Rectal Q6H PRN Bodenheimer, Charles A, NP      . lacosamide (VIMPAT) 200 mg in sodium chloride 0.9 % 25 mL IVPB  200 mg Intravenous Q12H Aroor, Sushanth R, MD 90 mL/hr at 02/11/19 1014 200 mg at 02/11/19 1014  . levETIRAcetam (KEPPRA) IVPB 1500 mg/ 100 mL premix  1,500 mg Intravenous Q12H Aroor, Karena Addison R, MD 400 mL/hr at 02/11/19 0541 1,500 mg at 02/11/19 0541  . LORazepam (ATIVAN) injection 0.5 mg  0.5 mg Intravenous Q12H Kerney Elbe, MD      . LORazepam (ATIVAN) injection 2 mg  2 mg Intravenous Q2H PRN Shela Leff, MD   2 mg at 02/08/19 1426  . metoCLOPramide (REGLAN) injection 5 mg  5 mg Intravenous Once PRN Elodia Florence., MD      . ondansetron [facility]) injection 4 mg  4 mg Intravenous Q6H PRN Shela Leff, MD   4 mg at 02/04/19 1232  . pantoprazole (PROTONIX) EC tablet 40 mg  40 mg Oral Daily Elodia Florence., MD   40 mg at 02/10/19 0919  . phenytoin (DILANTIN) injection 100 mg  100 mg Intravenous Q8H Shela Leff, MD   100 mg at 02/11/19 0533  . senna-docusate (Senokot-S) tablet 1 tablet  1 tablet Oral QHS PRN Kerney Elbe, MD   1 tablet at 02/10/19 2157  . topiramate (TOPAMAX) tablet 200 mg  200 mg Oral BID Aroor, Lanice Schwab, MD   200 mg at 02/10/19 2211     Discharge Medications: Please see discharge summary for a list of discharge medications.  Relevant Imaging Results:  Relevant  Lab Results:   Additional Information SS#: [id number]  Geralynn Ochs, LCSW

## 2019-02-11 NOTE — Progress Notes (Signed)
Fevers recorded this evening. CXR negative. UA ordered, not resulted. Only other source that we have is the dental "cyst". Reasonable to initiate abx for oral infection. Start clindamycin IV 628m TID and monitor.

## 2019-02-11 NOTE — Progress Notes (Signed)
Subjective: Patient continues to be confused.   Objective: Current vital signs: BP 136/88 (BP Location: Left Arm)   Pulse (!) 111   Temp 98.5 F (36.9 C) (Oral)   Resp 18   Ht 5' 9"  (1.753 m)   Wt 73.1 kg   SpO2 100%   BMI 23.80 kg/m  Vital signs in last 24 hours: Temp:  [98.5 F (36.9 C)-101.1 F (38.4 C)] 98.5 F (36.9 C) (05/04 0319) Pulse Rate:  [87-118] 111 (05/04 0319) Resp:  [16-18] 18 (05/04 0319) BP: (113-139)/(72-99) 136/88 (05/04 0319) SpO2:  [99 %-100 %] 100 % (05/04 0319)  Intake/Output from previous day: 05/03 0701 - 05/04 0700 In: 270.5 [IV Piggyback:270.5] Out: 1175 [Urine:1175] Intake/Output this shift: No intake/output data recorded. Nutritional status:  Diet Order            DIET DYS 3 Room service appropriate? No; Fluid consistency: Thin  Diet effective now             HEENT: Evidence for remote head trauma Lungs: Respirations unlabored Ext: No edema  Neurologic Exam: Ment: Awake with decreased level of alertness. Severely decreased attention. Short 1-4 word answers to questions; answers often unrelated to question asked. Able to respond to name, gaze at examiner and follow some simple commands. Perseverates. Expressive = receptive dysphasia.  CN: Will track examiner to left and right. No facial droop. Motor: 5/5 BUE with bradykinetic movements and increased latency of motor responses.  5/5 LLE and RLE with  bradykinetic movements and increased latency of motor responses. Tends to move RLE more slowly and with greater latency than on the left.  Sensory: Responds to FT x 4 Cerebellar: Unable to follow complex motor commands Gait: Deferred due to safety concerns  Lab Results: Results for orders placed or performed during the hospital encounter of 02/03/19 (from the past 48 hour(s))  Glucose, capillary     Status: Abnormal   Collection Time: 02/09/19  7:20 PM  Result Value Ref Range   Glucose-Capillary 101 (H) 70 - 99 mg/dL   Comment 1  Notify RN   Basic metabolic panel     Status: Abnormal   Collection Time: 02/10/19  4:44 AM  Result Value Ref Range   Sodium 139 135 - 145 mmol/L   Potassium 4.0 3.5 - 5.1 mmol/L   Chloride 110 98 - 111 mmol/L   CO2 21 (L) 22 - 32 mmol/L   Glucose, Bld 108 (H) 70 - 99 mg/dL   BUN 13 6 - 20 mg/dL   Creatinine, Ser 1.01 0.61 - 1.24 mg/dL   Calcium 9.2 8.9 - 10.3 mg/dL   GFR calc non Af Amer >60 >60 mL/min   GFR calc Af Amer >60 >60 mL/min   Anion gap 8 5 - 15    Comment: Performed at [facility], [place] [address], [place], [place] [address]  Magnesium     Status: None   Collection Time: 02/10/19  4:44 AM  Result Value Ref Range   Magnesium 2.1 1.7 - 2.4 mg/dL    Comment: Performed at [place] [address], [place] [address]  CBC     Status: None   Collection Time: 02/10/19  4:44 AM  Result Value Ref Range   WBC 7.3 4.0 - 10.5 K/uL   RBC 4.34 4.22 - 5.81 MIL/uL   Hemoglobin 13.5 13.0 - 17.0 g/dL   HCT 40.2 39.0 - 52.0 %   MCV 92.6 80.0 - 100.0 fL  MCH 31.1 26.0 - 34.0 pg   MCHC 33.6 30.0 - 36.0 g/dL   RDW 12.2 11.5 - 15.5 %   Platelets 225 150 - 400 K/uL   nRBC 0.0 0.0 - 0.2 %    Comment: Performed at [facility], [place] [address], [place], [place] [address]  Glucose, capillary     Status: None   Collection Time: 02/10/19  7:35 AM  Result Value Ref Range   Glucose-Capillary 96 70 - 99 mg/dL   Comment 1 Notify RN    Comment 2 Document in Chart   Glucose, capillary     Status: Abnormal   Collection Time: 02/10/19 11:31 PM  Result Value Ref Range   Glucose-Capillary 118 (H) 70 - 99 mg/dL   Comment 1 Notify RN    Comment 2 Document in Chart   Basic metabolic panel     Status: Abnormal   Collection Time: 02/11/19  4:41 AM  Result Value Ref Range   Sodium 137 135 - 145 mmol/L   Potassium 4.0 3.5 - 5.1 mmol/L   Chloride 109 98 - 111 mmol/L   CO2 18 (L) 22 - 32 mmol/L   Glucose, Bld 115 (H) 70 - 99 mg/dL   BUN 10 6 - 20 mg/dL   Creatinine,  Ser 1.08 0.61 - 1.24 mg/dL   Calcium 9.2 8.9 - 10.3 mg/dL   GFR calc non Af Amer >60 >60 mL/min   GFR calc Af Amer >60 >60 mL/min   Anion gap 10 5 - 15    Comment: Performed at [facility], [place] [address], [place], [place] [address]  Magnesium     Status: None   Collection Time: 02/11/19  4:41 AM  Result Value Ref Range   Magnesium 2.0 1.7 - 2.4 mg/dL    Comment: Performed at [place] [address], [place], [place] [address]  CBC     Status: Abnormal   Collection Time: 02/11/19  4:41 AM  Result Value Ref Range   WBC 16.1 (H) 4.0 - 10.5 K/uL   RBC 4.54 4.22 - 5.81 MIL/uL   Hemoglobin 13.7 13.0 - 17.0 g/dL   HCT 41.5 39.0 - 52.0 %   MCV 91.4 80.0 - 100.0 fL   MCH 30.2 26.0 - 34.0 pg   MCHC 33.0 30.0 - 36.0 g/dL   RDW 12.1 11.5 - 15.5 %   Platelets 213 150 - 400 K/uL   nRBC 0.0 0.0 - 0.2 %    Comment: Performed at [facility], [place] [address], [place],  [address]  Glucose, capillary     Status: Abnormal   Collection Time: 02/11/19  9:01 AM  Result Value Ref Range   Glucose-Capillary 126 (H) 70 - 99 mg/dL    Recent Results (from the past 240 hour(s))  SARS Coronavirus 2 [facility] order, Performed in [place] hospital lab)     Status: None   Collection Time: 02/03/19  7:46 PM  Result Value Ref Range Status   SARS Coronavirus 2 NEGATIVE NEGATIVE Final    Comment: (NOTE) If result is NEGATIVE SARS-CoV-2 target nucleic acids are NOT DETECTED. The SARS-CoV-2 RNA is generally detectable in upper and lower  respiratory specimens during the acute phase of infection. The lowest  concentration of SARS-CoV-2 viral copies this assay can detect is 250  copies / mL. A negative result does not preclude SARS-CoV-2 infection  and should not be used as the sole basis for treatment  or other  patient management decisions.  A negative result may occur with  improper specimen collection / handling, submission of specimen other  than nasopharyngeal swab,  presence of viral mutation(s) within the  areas targeted by this assay, and inadequate number of viral copies  (<250 copies / mL). A negative result must be combined with clinical  observations, patient history, and epidemiological information. If result is POSITIVE SARS-CoV-2 target nucleic acids are DETECTED. The SARS-CoV-2 RNA is generally detectable in upper and lower  respiratory specimens dur ing the acute phase of infection.  Positive  results are indicative of active infection with SARS-CoV-2.  Clinical  correlation with patient history and other diagnostic information is  necessary to determine patient infection status.  Positive results do  not rule out bacterial infection or co-infection with other viruses. If result is PRESUMPTIVE POSTIVE SARS-CoV-2 nucleic acids MAY BE PRESENT.   A presumptive positive result was obtained on the submitted specimen  and confirmed on repeat testing.  While 2019 novel coronavirus  (SARS-CoV-2) nucleic acids may be present in the submitted sample  additional confirmatory testing may be necessary for epidemiological  and / or clinical management purposes  to differentiate between  SARS-CoV-2 and other Sarbecovirus currently known to infect humans.  If clinically indicated additional testing with an alternate test  methodology [phone]) is advised. The SARS-CoV-2 RNA is generally  detectable in upper and lower respiratory sp ecimens during the acute  phase of infection. The expected result is Negative. Fact Sheet for Patients:  StrictlyIdeas.no Fact Sheet for Healthcare Providers: BankingDealers.co.za This test is not yet approved or cleared by the [place] FDA and has been authorized for detection and/or diagnosis of SARS-CoV-2 by FDA under an Emergency Use Authorization (EUA).  This EUA will remain in effect (meaning this test can be used) for the duration of the COVID-19 declaration under  Section 564(b)(1) of the Act, 21 U.S.C. section 360bbb-3(b)(1), unless the authorization is terminated or revoked sooner. Performed at [facility], [place] [address], [place], [place] [address]   MRSA PCR Screening     Status: None   Collection Time: 02/04/19 11:27 AM  Result Value Ref Range Status   MRSA by PCR NEGATIVE NEGATIVE Final    Comment:        The GeneXpert MRSA Assay (FDA approved for NASAL specimens only), is one component of a comprehensive MRSA colonization surveillance program. It is not intended to diagnose MRSA infection nor to guide or monitor treatment for MRSA infections. Performed at [facility], [place] [address], [place], [place] [address]     Lipid Panel No results for input(s): CHOL, TRIG, HDL, CHOLHDL, VLDL, LDLCALC in the last 72 hours.  Studies/Results: No results found.  Medications:  Prior to Admission:  Medications Prior to Admission  Medication Sig Dispense Refill Last Dose  . acamprosate (CAMPRAL) 333 MG tablet Take 666 mg by mouth 3 (three) times daily with meals.   02/02/2019  . acetaminophen (TYLENOL) 325 MG tablet Take 650 mg by mouth every 4 (four) hours as needed for mild pain.   unknown  . atorvastatin (LIPITOR) 40 MG tablet Take 40 mg by mouth at bedtime.   02/01/2019  . docusate sodium (STOOL SOFTENER) 100 MG capsule Take 100 mg by mouth 2 (two) times daily.   02/02/2019  . ferrous sulfate (FEROSUL) 325 (65 FE) MG tablet Take 325 mg by mouth 2 (two) times daily with a meal.   02/02/2019  . levETIRAcetam (  KEPPRA) 1000 MG tablet Take 1,000 mg by mouth 2 (two) times daily.   02/02/2019  . LORazepam (ATIVAN) 2 MG/ML injection Inject 0.5 mg into the vein once. For agitation and anxiety, May repeat 1 dose if needed.   01/18/2019  . metoprolol tartrate (LOPRESSOR) 25 MG tablet Take 25 mg by mouth 2 (two) times daily.   02/02/2019 at 0800  . naltrexone (DEPADE) 50 MG tablet Take 50 mg by mouth 2 (two) times a day.   02/02/2019   . phenytoin (DILANTIN) 100 MG ER capsule Take 100 mg by mouth 3 (three) times daily.   02/02/2019  . polyethylene glycol (MIRALAX / GLYCOLAX) 17 g packet Take 17 g by mouth daily.   02/02/2019  . QUEtiapine (SEROQUEL) 100 MG tablet Take 100 mg by mouth 2 (two) times daily.   02/02/2019  . sertraline (ZOLOFT) 100 MG tablet Take 100 mg by mouth daily.   02/02/2019   Scheduled: . LORazepam  1 mg Intravenous Q12H  . pantoprazole  40 mg Oral Daily  . phenytoin (DILANTIN) IV  100 mg Intravenous Q8H  . topiramate  200 mg Oral BID   Continuous: . lacosamide (VIMPAT) IV Stopped (02/11/19 0041)  . levETIRAcetam 1,500 mg (02/11/19 0541)  . sodium chloride      EEG report, 5/2:  This EEG shows evidence of cerebral dysfunction in the left frontal region with potential epileptogenicity. No seizures were present. . Continuous  monitoring is recommended  if concern for seizures persist. Clinical correlation is advised.   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 Has had no further clinical seizures but had remained aphasic with right hemiparesis. The right hemiparesis has resolved except for increased latency of motor responses involving the RLE today (5/4). Aphasia has been gradually improving. 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 Exam today is stable relative to yesterday's documented findings 2. On Keppra, Dilantin, Topamax and Vimpat, as well as scheduled Ativan 1 mg BID.  3. No clinical seizure activity on today's exam. cEEG On 5/1 and 5/2showed evidence of cerebral dysfunction in the left frontal region withpotentialepileptogenicity.No seizures were recorded.The last seizure was 4/30 at 05:19. 4. Duration of his encephalopathy is most likely due to prolonged status epilepticus +/- sedating effects of multiple AEDs as  well as scheduled Ativan.  Recommendations: 1. Continue Keppra 1500 mg BID 2. Continue Dilantin 100 mg TID 3. Repeat  Dilantin level has been ordered 4. Continue Vimpat 200 mg BID 5. Continue Topamax 200 mg BID 6. Wean Ativan to 0.5 mg BID (order has been changed) 7. Continue seizure precautions 8. After Ativan is fully tapered (anticipate Wednesday), goal will be to eventually taper his 4 AEDs to 2 agents now that he is no longer having seizures.  However this would need to be done over weeks to prevent rebound seizures.  Agree with placement and close follow-up with neurologist as outpatient to further taper medications.    LOS: 7 days   @Electronically  signed: Dr. Kerney Elbe 02/11/2019  9:32 AM

## 2019-02-11 NOTE — Progress Notes (Signed)
[employer] came by and measured for a cranial helmet. They stated it should be back tomorrow

## 2019-02-11 NOTE — Progress Notes (Signed)
Patient had a BM and sheets changed

## 2019-02-11 NOTE — Progress Notes (Signed)
Orthopedic Tech Progress Note Patient Details:  Travis Briggs. [birth year] [id number] Called in order to [facility] for helmet. Patient ID: Andrey Farmer., male   DOB: Nov 21, 1975, 43 y.o.   MRN: [id number]   Janit Pagan 02/11/2019, 10:49 AM

## 2019-02-11 NOTE — Progress Notes (Signed)
  Speech Language Pathology Treatment: Dysphagia  Patient Details Name: Travis Briggs. MRN: [id number] DOB: June 20, 1976 Today's Date: 02/11/2019 Time: 6294-7654 SLP Time Calculation (min) (ACUTE ONLY): 20 min  Assessment / Plan / Recommendation Clinical Impression  Patient seen to address dysphagia goals with current diet of Dys 3 (mech soft) and thin liquids as well as trial of small amount of regular solids. Per RN tech who was helping feed patient, he continues to be drowsy and that has been his baseline during this hospitalization. SLP observed patient with delayed mastication and oral manipulation of bolus despite adequate dentition and lingual ROM, strength. When masticating, patient did so very lightly, without putting adequate effort, resulting in slow mastication. No overt s/s of aspiration or penetration noted with thin liquids or solids, no pocketing or holding of boluses noted. Patient does not appear ready to upgrade to regular textures from dysphagia 3 (mechanical soft) solid textures at this time, but if mentation and alertness improve, he will be able to upgrade to regular solids at bedside.    HPI HPI: Pt is a 43 y.o. 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 EMS was called after bystanders observed patient having a seizure at a local bus stop. When EMS arrived they noted him having tonic-clonic seizure activity. Patient was given 5 mg of Versed IM. In ED he was noted to have right hemiparesis, right gaze preference, and a right dense aphasia with inability to speak.  MRI of the brain revealed bifrontal, left temporal and parietal encephalomalacia with no acute intracranial abnormality identified. EEG was performed and showed evidence of cerebral dysfunction in theleft frontal region with epileptogenicity.Frequent seizures were recorded from the  left frontal region lasting on average 1-2 minutes with no apparent clinical signs on video.       SLP Plan  Continue with current plan of care       Recommendations  Diet recommendations: Dysphagia 3 (mechanical soft);Thin liquid Liquids provided via: Cup;Straw Medication Administration: Whole meds with puree Supervision: Full supervision/cueing for compensatory strategies Compensations: Slow rate;Small sips/bites;Minimize environmental distractions Postural Changes and/or Swallow Maneuvers: Seated upright 90 degrees                Oral Care Recommendations: Oral care BID Follow up Recommendations: Skilled Nursing facility SLP Visit Diagnosis: Dysphagia, oral phase (R13.11) Plan: Continue with current plan of care       GO                Dannial Monarch 02/11/2019, 9:55 AM   [name], MA, [facility] Pager: [phone]

## 2019-02-11 NOTE — Progress Notes (Signed)
Marland Kitchen  PROGRESS NOTE    Travis Briggs Levora Dredge.  KKX:[id number] DOB: 12-01-1975 DOA: 02/03/2019 PCP: System, Pcp Not In   Brief Narrative:   43 yo M Presenting to the hospital via EMS.It is unknown at this time whether patient has any additional past medical history. They were called by an observer at a local bus stop informing them that the patient was having a seizure. Upon arrival, EMS found the patient unattended having tonic-clonic seizure activity. The individual who called EMS from the bus stop was no longer present on EMS arrival. He was given 5 mg Versed IM. In the ED, patient noted to have right hemiparesis, right gaze preference, and dense aphasia with inability to speak. He had 2 more episodes of witnessed seizure activity in the ED. Received a total of 4 mg Ativan, 1000 mg IV Keppra, and 1450 mg of IV fosphenytoin.Patient is very somnolent and nonverbal. No history could be obtained from him.    Assessment & Plan:   Principal Problem:   Status epilepticus ([place]) Active Problems:   Dental cyst   Stroke (cerebrum) (HCC)   Thrombocytopenia (HCC)   Status epilepticus     - Multiple seizures prior to presentation: Residual RIGHT sided weakness (Todd's paralysis?)     - 4/30 per neurology note patient continues to have frequent subclinical seizures without return to baseline     - 5/2 EEG shows no seizures see results below.  However patient's mentation does not appear to have improved new baseline?  Iatrogenic from over medication?.  Will await neurology recommendations.     - Lacosamide 200 mg twice daily     - Keppra 1500 mg twice daily     - Phenytoin 100 mg every 8 hours     - Topamax 200 mg twice daily     - Neuro is hoping to deescalate to 2 agents  Dental cyst     - CT showing cystic lesion involving the right mandible which is associated with the right lower premolar dental root. This is thought to be most likely a dental cyst which could be due to a radicular  cyst or chronic infection.     - He will need dentistry follow-up.     - has elevated WBCs and had a fever 48 hours ago, may be related  Acute encephalopathy     - Per chart at admission patient was discussed with his Sister [name] at baseline has poor memory will forget his name birthday.  Has word finding difficulty, aphasia.  Is supposed to wear helmet     - has cranial helmet ordered     - Sister [name] can be reached at [phone].  Thrombocytopenia     - resolved  Leukocytosis     - check CXR, UA     - may be related to cyst  DVT prophylaxis: SCDs Code Status: FULL Family Communication: None   Disposition Plan: To SNF?   Consultants:   Neurology  Procedures:   None  Antimicrobials:  . None    Subjective: Tachy ON  Objective: Vitals:   02/10/19 2329 02/11/19 0319 02/11/19 1145 02/11/19 1500  BP: 120/72 136/88 121/75 122/85  Pulse: (!) 107 (!) 111 (!) 103 (!) 111  Resp: 18 18 17 17   Temp: 98.9 F (37.2 C) 98.5 F (36.9 C) 99.9 F (37.7 C) 99.4 F (37.4 C)  TempSrc: Oral Oral Axillary Axillary  SpO2: 100% 100% 100% 100%  Weight:      Height:  Intake/Output Summary (Last 24 hours) at 02/11/2019 1558 Last data filed at 02/11/2019 1419 Gross per 24 hour  Intake 866.45 ml  Output 1225 ml  Net -358.55 ml   Filed Weights   02/03/19 1642 02/09/19 0305  Weight: 72.6 kg 73.1 kg    Examination:  General exam: 43 y.o. male resting in bed in NAD  Respiratory system: Upper airway transmission. Slight rhonchi Respiratory effort normal. Cardiovascular system: S1 & S2 heard, RRR. No JVD, murmurs, rubs, gallops or clicks. No pedal edema. Gastrointestinal system: Abdomen is nondistended, soft and nontender. No organomegaly or masses felt. Normal bowel sounds heard. Central nervous system: calm, not following commands. Skin: No rashes, lesions or ulcers    Data Reviewed: I have personally reviewed following labs and imaging studies.  CBC: Recent Labs   Lab 2019-02-28 0339 02/09/19 0426 02/10/19 0444 02/11/19 0441 02/11/19 1213  WBC 6.0 5.1 7.3 16.1* 19.1*  HGB 14.6 13.7 13.5 13.7 12.9*  HCT 43.9 41.6 40.2 41.5 38.8*  MCV 92.6 92.7 92.6 91.4 91.9  PLT 214 214 225 213 720   Basic Metabolic Panel: Recent Labs  Lab 02/07/19 0937 2019/02/28 0339 02/09/19 0426 02/10/19 0444 02/11/19 0441  NA 138 138 138 139 137  K 4.0 4.1 3.9 4.0 4.0  CL 106 107 109 110 109  CO2 21* 20* 19* 21* 18*  GLUCOSE 100* 107* 108* 108* 115*  BUN 7 9 13 13 10   CREATININE 0.92 1.08 0.97 1.01 1.08  CALCIUM 9.5 9.4 9.1 9.2 9.2  MG 2.0 1.9 2.1 2.1 2.0   GFR: Estimated Creatinine Clearance: 87.3 mL/min (by C-G formula based on SCr of 1.08 mg/dL). Liver Function Tests: Recent Labs  Lab 02/04/19 1816 02/05/19 1502 02/06/19 0452  AST 20 19 19   ALT 29 25 29   ALKPHOS 84 80 84  BILITOT 0.5 0.3 0.6  PROT 6.5 6.3* 6.5  ALBUMIN 3.9 3.7 3.7   No results for input(s): LIPASE, AMYLASE in the last 168 hours. No results for input(s): AMMONIA in the last 168 hours. Coagulation Profile: No results for input(s): INR, PROTIME in the last 168 hours. Cardiac Enzymes: No results for input(s): CKTOTAL, CKMB, CKMBINDEX, TROPONINI in the last 168 hours. BNP (last 3 results) No results for input(s): PROBNP in the last 8760 hours. HbA1C: No results for input(s): HGBA1C in the last 72 hours. CBG: Recent Labs  Lab 02/10/19 0735 02/10/19 1253 02/10/19 2331 02/11/19 0901 02/11/19 1151  GLUCAP 96 95 118* 126* 125*   Lipid Profile: No results for input(s): CHOL, HDL, LDLCALC, TRIG, CHOLHDL, LDLDIRECT in the last 72 hours. Thyroid Function Tests: No results for input(s): TSH, T4TOTAL, FREET4, T3FREE, THYROIDAB in the last 72 hours. Anemia Panel: No results for input(s): VITAMINB12, FOLATE, FERRITIN, TIBC, IRON, RETICCTPCT in the last 72 hours. Sepsis Labs: Recent Labs  Lab 02/11/19 0857 02/11/19 1213  LATICACIDVEN 0.8 1.1    Recent Results (from the past 240  hour(s))  SARS Coronavirus 2 [facility] order, Performed in [facility] hospital lab)     Status: None   Collection Time: 02/03/19  7:46 PM  Result Value Ref Range Status   SARS Coronavirus 2 NEGATIVE NEGATIVE Final    Comment: (NOTE) If result is NEGATIVE SARS-CoV-2 target nucleic acids are NOT DETECTED. The SARS-CoV-2 RNA is generally detectable in upper and lower  respiratory specimens during the acute phase of infection. The lowest  concentration of SARS-CoV-2 viral copies this assay can detect is 250  copies / mL. A negative result does  not preclude SARS-CoV-2 infection  and should not be used as the sole basis for treatment or other  patient management decisions.  A negative result may occur with  improper specimen collection / handling, submission of specimen other  than nasopharyngeal swab, presence of viral mutation(s) within the  areas targeted by this assay, and inadequate number of viral copies  (<250 copies / mL). A negative result must be combined with clinical  observations, patient history, and epidemiological information. If result is POSITIVE SARS-CoV-2 target nucleic acids are DETECTED. The SARS-CoV-2 RNA is generally detectable in upper and lower  respiratory specimens dur ing the acute phase of infection.  Positive  results are indicative of active infection with SARS-CoV-2.  Clinical  correlation with patient history and other diagnostic information is  necessary to determine patient infection status.  Positive results do  not rule out bacterial infection or co-infection with other viruses. If result is PRESUMPTIVE POSTIVE SARS-CoV-2 nucleic acids MAY BE PRESENT.   A presumptive positive result was obtained on the submitted specimen  and confirmed on repeat testing.  While 2019 novel coronavirus  (SARS-CoV-2) nucleic acids may be present in the submitted sample  additional confirmatory testing may be necessary for epidemiological  and / or clinical management  purposes  to differentiate between  SARS-CoV-2 and other Sarbecovirus currently known to infect humans.  If clinically indicated additional testing with an alternate test  methodology [phone]) is advised. The SARS-CoV-2 RNA is generally  detectable in upper and lower respiratory sp ecimens during the acute  phase of infection. The expected result is Negative. Fact Sheet for Patients:  StrictlyIdeas.no Fact Sheet for Healthcare Providers: BankingDealers.co.za This test is not yet approved or cleared by the [place] FDA and has been authorized for detection and/or diagnosis of SARS-CoV-2 by FDA under an Emergency Use Authorization (EUA).  This EUA will remain in effect (meaning this test can be used) for the duration of the COVID-19 declaration under Section 564(b)(1) of the Act, 21 U.S.C. section 360bbb-3(b)(1), unless the authorization is terminated or revoked sooner. Performed at [facility], [place] [address] [place], [place] [address]   MRSA PCR Screening     Status: None   Collection Time: 02/04/19 11:27 AM  Result Value Ref Range Status   MRSA by PCR NEGATIVE NEGATIVE Final    Comment:        The GeneXpert MRSA Assay (FDA approved for NASAL specimens only), is one component of a comprehensive MRSA colonization surveillance program. It is not intended to diagnose MRSA infection nor to guide or monitor treatment for MRSA infections. Performed at [facility], [place] [address], [place], [place] [address]          Radiology Studies: Dg Chest Port 1 View  Result Date: 02/11/2019 CLINICAL DATA:  Rhonchi. EXAM: PORTABLE CHEST 1 VIEW COMPARISON:  Radiograph of February 04, 2019. FINDINGS: The heart size and mediastinal contours are within normal limits. Both lungs are clear. No pneumothorax or pleural effusion is noted. Old left rib fractures are noted. IMPRESSION: No active disease. Electronically Signed    By: Marijo Conception M.D.   On: 02/11/2019 15:39        Scheduled Meds: . LORazepam  0.5 mg Intravenous Q12H  . pantoprazole  40 mg Oral Daily  . phenytoin (DILANTIN) IV  100 mg Intravenous Q8H  . topiramate  200 mg Oral BID   Continuous Infusions: . sodium chloride 20 mL/hr at 02/11/19 1246  .  lacosamide (VIMPAT) IV 200 mg (02/11/19 1014)  . levETIRAcetam 1,500 mg (02/11/19 0541)     LOS: 7 days    Time spent: 35 minutes spent in the coordination of care today.    Jonnie Finner, DO [facility] Pager [phone]  If 7PM-7AM, please contact night-coverage [email or web address] Password [email or web address] 02/11/2019, 3:58 PM

## 2019-02-11 NOTE — Progress Notes (Signed)
Nurse heard a thump patient fell on floor no injuries noted. Patient family and doctor notified. Patient was in chair when he fell. Patient currently in bed with alarm on and acting the same. Patient trying to get out of bed.

## 2019-02-11 NOTE — Progress Notes (Signed)
Occupational Therapy Treatment Patient Details Name: Travis Briggs. MRN: [id number] DOB: [birth year] Today's Date: 02/11/2019    History of present illness 43 y/o male admitted secondary to seizure like activity at [place] bus stop. Per notes, pt with hx of TBI s/p craniotomy.    OT comments  Pt up in chair total +2 Min (A) for lunch with tech in room feeding patient. Pt high fall risk and should return to bed after eating with staff assistance full supervision per SLP recommendations. Pt able to initiate self feeding but unable to sustain the task.    Follow Up Recommendations  SNF    Equipment Recommendations       Recommendations for Other Services      Precautions / Restrictions Precautions Precautions: Fall Precaution Comments: seizure Required Braces or Orthoses: Other Brace(pending helmet arrival from [facility]) Restrictions Weight Bearing Restrictions: No       Mobility Bed Mobility Overal bed mobility: Needs Assistance Bed Mobility: Supine to Sit     Supine to sit: Mod assist;+2 for physical assistance;+2 for safety/equipment     General bed mobility comments: pt requires (A) to problem solve sitting eob and to scoot to the eob. pt once eob progressing from min (A) to min guard (A). pt static sitting min guard (A) for oral care.   Transfers Overall transfer level: Needs assistance   Transfers: Sit to/from Stand Sit to Stand: Min assist;+2 physical assistance         General transfer comment: pt with hand held (A) to complete transfer. pt able to power up from surface and needs (A) due to R LE weakness    Balance Overall balance assessment: Needs assistance Sitting-balance support: Bilateral upper extremity supported;Feet supported Sitting balance-Leahy Scale: Fair     Standing balance support: Bilateral upper extremity supported;During functional activity Standing balance-Leahy Scale: Poor Standing balance comment: min (A) steady (A)                            ADL either performed or assessed with clinical judgement   ADL Overall ADL's : Needs assistance/impaired Eating/Feeding: Minimal assistance;Sitting Eating/Feeding Details (indicate cue type and reason): requires pillow under R arm to help with self feeding. pt requires increased time. tech in the room to (A) upon ending of therapy session. pt is full supervision per speech orders Grooming: Wash/dry hands;Moderate assistance;Standing Grooming Details (indicate cue type and reason): hand over hand to inidicate and unable to sustain the task Upper Body Bathing: Moderate assistance   Lower Body Bathing: Maximal assistance           Toilet Transfer: Minimal assistance           Functional mobility during ADLs: Minimal assistance;+2 for physical assistance General ADL Comments: pt transfer away from the bed and showing R LE lagging and incr fall risk with R turns.      Vision       Perception     Praxis      Cognition Arousal/Alertness: Awake/alert Behavior During Therapy: WFL for tasks assessed/performed Overall Cognitive Status: History of cognitive impairments - at baseline                                 General Comments: pt with hx of TBI and currently requires mod cues for basic task. pt initiates but needs (A) to sustain task  Exercises     Shoulder Instructions       General Comments HR 128 with movement: pt with a few verbalizations this session and expressed being hungry. pt positioned in the chair for self feeding. tech in the room feeding patient at the end of session. pt will require return to bed after eating due to fall risk     Pertinent Vitals/ Pain          Home Living                                          Prior Functioning/Environment              Frequency  Min 3X/week        Progress Toward Goals  OT Goals(current goals can now be found in the care plan section)   Progress towards OT goals: Progressing toward goals  Acute Rehab OT Goals Patient Stated Goal: to eat lunch OT Goal Formulation: Patient unable to participate in goal setting Time For Goal Achievement: 02/12/19 ADL Goals Pt Will Perform Eating: with modified independence Pt Will Perform Grooming: with modified independence Pt Will Perform Upper Body Bathing: with supervision Pt Will Perform Lower Body Bathing: with supervision Pt Will Perform Upper Body Dressing: with supervision Pt Will Perform Lower Body Dressing: with min assist Pt Will Transfer to Toilet: with supervision  Plan Discharge plan remains appropriate    Co-evaluation    PT/OT/SLP Co-Evaluation/Treatment: Yes Reason for Co-Treatment: Complexity of the patient's impairments (multi-system involvement);Necessary to address cognition/behavior during functional activity;For patient/therapist safety;To address functional/ADL transfers   OT goals addressed during session: ADL's and self-care;Proper use of Adaptive equipment and DME;Strengthening/ROM      AM-PAC OT "6 Clicks" Daily Activity     Outcome Measure   Help from another person eating meals?: A Little Help from another person taking care of personal grooming?: A Lot Help from another person toileting, which includes using toliet, bedpan, or urinal?: A Lot Help from another person bathing (including washing, rinsing, drying)?: A Lot Help from another person to put on and taking off regular upper body clothing?: A Lot Help from another person to put on and taking off regular lower body clothing?: A Lot 6 Click Score: 13    End of Session Equipment Utilized During Treatment: Gait belt  OT Visit Diagnosis: Unsteadiness on feet (R26.81);Muscle weakness (generalized) (M62.81);Apraxia (R48.2);Other symptoms and signs involving the nervous system (R29.898);Other symptoms and signs involving cognitive function;Hemiplegia and hemiparesis Hemiplegia - Right/Left: Right    Activity Tolerance Patient tolerated treatment well   Patient Left in chair;with call bell/phone within reach;with chair alarm set;with nursing/sitter in room(tech feeding patient)   Nurse Communication Mobility status;Precautions        Time: 7026-3785 OT Time Calculation (min): 33 min  Charges: OT General Charges $OT Visit: 1 Visit OT Treatments $Self Care/Home Management : 8-22 mins   Jeri Modena, OTR/L  Acute Rehabilitation Services Pager: [phone] Office: [phone] .    Jeri Modena 02/11/2019, 4:02 PM

## 2019-02-11 NOTE — Progress Notes (Signed)
Physical Therapy Treatment Patient Details Name: Travis Briggs. MRN: [id number] DOB: 02-25-1976 Today's Date: 02/11/2019    History of Present Illness 43 y/o male admitted secondary to seizure like activity at a bus stop. Per notes, pt with hx of TBI s/p craniotomy.     PT Comments    Patient seen for mobility progression. Pt requires +2 assist for gait training this session and tolerated gait distance of 75 ft. Pt with minimal verbalization during session but did express feeling hungry. Pt up in chair with NT present for assistance with lunch end of session. Continue to progress as tolerated with anticipated d/c to SNF for further skilled PT services.     Follow Up Recommendations  SNF     Equipment Recommendations  Other (comment)(TBD next venue)    Recommendations for Other Services       Precautions / Restrictions Precautions Precautions: Fall Precaution Comments: seizure Required Braces or Orthoses: Other Brace(pending helmet arrival from [facility]) Restrictions Weight Bearing Restrictions: No    Mobility  Bed Mobility Overal bed mobility: Needs Assistance Bed Mobility: Supine to Sit     Supine to sit: Mod assist;+2 for physical assistance;+2 for safety/equipment     General bed mobility comments: pt requires (A) to problem solve sitting eob and to scoot to the eob. pt once eob progressing from min (A) to min guard (A). pt static sitting min guard (A) for oral care.   Transfers Overall transfer level: Needs assistance   Transfers: Sit to/from Stand Sit to Stand: Min assist;+2 physical assistance         General transfer comment: pt with hand held (A) to complete transfer. pt able to power up from surface and needs (A) due to R LE weakness  Ambulation/Gait Ambulation/Gait assistance: Min assist;Mod assist;+2 physical assistance;+2 safety/equipment Gait Distance (Feet): 75 Feet Assistive device: 2 person hand held assist(and assist at trunk with gait  belt) Gait Pattern/deviations: Step-to pattern;Decreased step length - right Gait velocity: Decreased    General Gait Details: cues for increased stride length and R step length however pt unable to make changes to gait with cues; pt has tendency to leave R LE behind and requires assistance for balance/weight shifting   Stairs             Wheelchair Mobility    Modified Rankin (Stroke Patients Only)       Balance Overall balance assessment: Needs assistance Sitting-balance support: Bilateral upper extremity supported;Feet supported Sitting balance-Leahy Scale: Fair     Standing balance support: Bilateral upper extremity supported;During functional activity Standing balance-Leahy Scale: Poor Standing balance comment: min (A) steady (A)                            Cognition Arousal/Alertness: Awake/alert Behavior During Therapy: WFL for tasks assessed/performed Overall Cognitive Status: History of cognitive impairments - at baseline                                 General Comments: pt with hx of TBI and currently requires mod cues for basic task. pt initiates but needs (A) to sustain task      Exercises      General Comments General comments (skin integrity, edema, etc.): HR 128 with movement; pt with a few verbalizations during session and expressed being hungry. pt up in chair for self feeding and NT present to assist  end of session.       Pertinent Vitals/Pain Pain Assessment: Faces Faces Pain Scale: No hurt    Home Living                      Prior Function            PT Goals (current goals can now be found in the care plan section) Acute Rehab PT Goals Patient Stated Goal: to eat lunch Progress towards PT goals: Progressing toward goals    Frequency    Min 3X/week      PT Plan Current plan remains appropriate    Co-evaluation PT/OT/SLP Co-Evaluation/Treatment: Yes Reason for Co-Treatment: Complexity of  the patient's impairments (multi-system involvement);Necessary to address cognition/behavior during functional activity;For patient/therapist safety;To address functional/ADL transfers PT goals addressed during session: Mobility/safety with mobility OT goals addressed during session: ADL's and self-care;Proper use of Adaptive equipment and DME;Strengthening/ROM      AM-PAC PT "6 Clicks" Mobility   Outcome Measure  Help needed turning from your back to your side while in a flat bed without using bedrails?: A Little Help needed moving from lying on your back to sitting on the side of a flat bed without using bedrails?: A Little Help needed moving to and from a bed to a chair (including a wheelchair)?: A Little Help needed standing up from a chair using your arms (e.g., wheelchair or bedside chair)?: A Little Help needed to walk in hospital room?: A Lot Help needed climbing 3-5 steps with a railing? : Total 6 Click Score: 15    End of Session Equipment Utilized During Treatment: Gait belt Activity Tolerance: Patient tolerated treatment well Patient left: in chair;with call bell/phone within reach;with nursing/sitter in room;Other (comment)(NT present) Nurse Communication: Mobility status PT Visit Diagnosis: Unsteadiness on feet (R26.81);Other abnormalities of gait and mobility (R26.89);Muscle weakness (generalized) (M62.81);History of falling (Z91.81);Difficulty in walking, not elsewhere classified (R26.2)     Time: 6295-2841 PT Time Calculation (min) (ACUTE ONLY): 33 min  Charges:  $Gait Training: 8-22 mins                     Earney Navy, PTA Acute Rehabilitation Services Pager: [phone] Office: [phone]     Darliss Cheney 02/11/2019, 5:15 PM

## 2019-02-12 LAB — CBC
HCT: 36.5 % — ABNORMAL LOW (ref 39.0–52.0)
Hemoglobin: 12.3 g/dL — ABNORMAL LOW (ref 13.0–17.0)
MCH: 31.1 pg (ref 26.0–34.0)
MCHC: 33.7 g/dL (ref 30.0–36.0)
MCV: 92.4 fL (ref 80.0–100.0)
Platelets: 196 10*3/uL (ref 150–400)
RBC: 3.95 MIL/uL — ABNORMAL LOW (ref 4.22–5.81)
RDW: 12.2 % (ref 11.5–15.5)
WBC: 17.4 10*3/uL — ABNORMAL HIGH (ref 4.0–10.5)
nRBC: 0 % (ref 0.0–0.2)

## 2019-02-12 LAB — BASIC METABOLIC PANEL
Anion gap: 8 (ref 5–15)
BUN: 8 mg/dL (ref 6–20)
CO2: 18 mmol/L — ABNORMAL LOW (ref 22–32)
Calcium: 8.7 mg/dL — ABNORMAL LOW (ref 8.9–10.3)
Chloride: 112 mmol/L — ABNORMAL HIGH (ref 98–111)
Creatinine, Ser: 0.95 mg/dL (ref 0.61–1.24)
GFR calc Af Amer: >60 mL/min (ref >60)
GFR calc non Af Amer: >60 mL/min (ref >60)
Glucose, Bld: 121 mg/dL — ABNORMAL HIGH (ref 70–99)
Potassium: 3.8 mmol/L (ref 3.5–5.1)
Sodium: 138 mmol/L (ref 135–145)

## 2019-02-12 LAB — URINALYSIS, ROUTINE W REFLEX MICROSCOPIC
Bilirubin Urine: NEGATIVE
Glucose, UA: NEGATIVE mg/dL
Hgb urine dipstick: NEGATIVE
Ketones, ur: 5 mg/dL — AB
Nitrite: POSITIVE — AB
Protein, ur: NEGATIVE mg/dL
Specific Gravity, Urine: 1.017 (ref 1.005–1.030)
pH: 8 (ref 5.0–8.0)

## 2019-02-12 LAB — GLUCOSE, CAPILLARY
Glucose-Capillary: 99 mg/dL (ref 70–99)
Glucose-Capillary: 106 mg/dL — ABNORMAL HIGH (ref 70–99)
Glucose-Capillary: 118 mg/dL — ABNORMAL HIGH (ref 70–99)
Glucose-Capillary: 85 mg/dL (ref 70–99)
Glucose-Capillary: 113 mg/dL — ABNORMAL HIGH (ref 70–99)
Glucose-Capillary: 123 mg/dL — ABNORMAL HIGH (ref 70–99)

## 2019-02-12 LAB — ALBUMIN: Albumin: 3.2 g/dL — ABNORMAL LOW (ref 3.5–5.0)

## 2019-02-12 LAB — PHENYTOIN LEVEL, TOTAL: Phenytoin Lvl: 14.4 ug/mL (ref 10.0–20.0)

## 2019-02-12 LAB — MAGNESIUM: Magnesium: 2 mg/dL (ref 1.7–2.4)

## 2019-02-12 MED ORDER — SODIUM CHLORIDE 0.9 % IV SOLN
2.0000 g | INTRAVENOUS | Status: DC
  Administered 2019-02-12 – 2019-02-14 (×3): 2 g via INTRAVENOUS
  Filled 2019-02-12 (×4): qty 20

## 2019-02-12 NOTE — Progress Notes (Addendum)
Marland Kitchen  PROGRESS NOTE    [name]  ZOX:[id number] DOB: [birth year] DOA: 02/03/2019 PCP: System, Pcp Not In   Brief Narrative:   43 yo M Presenting to the hospital via EMS.It is unknown at this time whether patient has any additional past medical history. They were called by an observer at a local bus stop informing them that the patient was having a seizure. Upon arrival, EMS found the patient unattended having tonic-clonic seizure activity. The individual who called EMS from the bus stop was no longer present on EMS arrival. He was given 5 mg Versed IM. In the ED, patient noted to have right hemiparesis, right gaze preference, and dense aphasia with inability to speak. He had 2 more episodes of witnessed seizure activity in the ED. Received a total of 4 mg Ativan, 1000 mg IV Keppra, and 1450 mg of IV fosphenytoin.Patient is very somnolent and nonverbal. No history could be obtained from him.    Assessment & Plan:   Principal Problem:   Status epilepticus ([place]) Active Problems:   Dental cyst   Stroke (cerebrum) (HCC)   Thrombocytopenia (HCC)   Status epilepticus     - Multiple seizures prior to presentation: Residual RIGHT sided weakness (Todd's paralysis?)     - 4/30 per neurology note patient continues to have frequent subclinical seizures without return to baseline     - 5/2 EEG shows no seizures see results below. However patient's mentation does not appear to have improved new baseline? Iatrogenic from over medication?. Will await neurology recommendations.     - Lacosamide 200 mg twice daily     - Keppra 1500 mg twice daily     - Phenytoin 100 mg every 8 hours     - Topamax 200 mg twice daily     - Neuro is hoping to deescalate to 2 agents  Dental cyst     - CT showing cystic lesion involving the right mandible which is associated with the right lower premolar dental root. This is thought to be most likely a dental cyst which could be due to a radicular  cyst or chronic infection.     - He will need dentistry follow-up.     - has elevated WBCs and fever ON     - clinda 681m IV TID started 02/11/2019; continue for 5 - 7 days  Acute encephalopathy     - Per chart at admission patient was discussed with his Sister LVaughan Bastaat baseline has poor memory will forget his name birthday. Has word finding difficulty, aphasia. Is supposed to wear helmet     - has cranial helmet ordered     - Sister LVaughan Bastacan be reached at [phone]  Thrombocytopenia     - resolved  Leukocytosis     - CXR neg; UCx pending     - may be related to cyst; clinda started, WBC improved   DVT prophylaxis: SCDs Code Status: FULL  Disposition Plan: To SNF?   Consultants:   Neurology  Procedures:   None  Antimicrobials:  . Clindamycin 607mIV q8h (END 02/18/2019)    Subjective: More alert today and interactive. Still with nonsensical speech  Objective: Vitals:   02/11/19 1940 02/12/19 0002 02/12/19 0202 02/12/19 0338  BP: 128/81 100/65  (!) 139/95  Pulse: (!) 115 (!) 102  (!) 101  Resp: 18 19  15   Temp: (!) 102.2 F (39 C) 100.1 F (37.8 C) 97.8 F (36.6 C) 99.1 F (37.3 C)  TempSrc: Oral Oral Oral Oral  SpO2: 100% 100%  97%  Weight:      Height:        Intake/Output Summary (Last 24 hours) at 02/12/2019 0819 Last data filed at 02/12/2019 5537 Gross per 24 hour  Intake 1771 ml  Output 900 ml  Net 871 ml   Filed Weights   02/03/19 1642 06-Mar-2019 0305  Weight: 72.6 kg 73.1 kg    Examination:  General: 43 y.o. male resting in bed in NAD Cardiovascular: RRR, +S1, S2, no m/g/r, equal pulses throughout Respiratory: CTABL, no w/r/r, normal WOB GI: BS+, NDNT, no masses noted, no organomegaly noted MSK: No e/c/c Skin: No rashes, bruises, ulcerations noted Neuro: awake, follows some commands, nonsensical speech, but more interactive today    Data Reviewed: I have personally reviewed following labs and imaging studies.  CBC: Recent  Labs  Lab Mar 06, 2019 0426 02/10/19 0444 02/11/19 0441 02/11/19 1213 02/12/19 0535  WBC 5.1 7.3 16.1* 19.1* 17.4*  HGB 13.7 13.5 13.7 12.9* 12.3*  HCT 41.6 40.2 41.5 38.8* 36.5*  MCV 92.7 92.6 91.4 91.9 92.4  PLT 214 225 213 202 482   Basic Metabolic Panel: Recent Labs  Lab 02/08/19 0339 03/06/19 0426 02/10/19 0444 02/11/19 0441 02/12/19 0535  NA 138 138 139 137 138  K 4.1 3.9 4.0 4.0 3.8  CL 107 109 110 109 112*  CO2 20* 19* 21* 18* 18*  GLUCOSE 107* 108* 108* 115* 121*  BUN 9 13 13 10 8   CREATININE 1.08 0.97 1.01 1.08 0.95  CALCIUM 9.4 9.1 9.2 9.2 8.7*  MG 1.9 2.1 2.1 2.0 2.0   GFR: Estimated Creatinine Clearance: 99.2 mL/min (by C-G formula based on SCr of 0.95 mg/dL). Liver Function Tests: Recent Labs  Lab 02/05/19 1502 02/06/19 0452 02/12/19 0535  AST 19 19  --   ALT 25 29  --   ALKPHOS 80 84  --   BILITOT 0.3 0.6  --   PROT 6.3* 6.5  --   ALBUMIN 3.7 3.7 3.2*   No results for input(s): LIPASE, AMYLASE in the last 168 hours. No results for input(s): AMMONIA in the last 168 hours. Coagulation Profile: No results for input(s): INR, PROTIME in the last 168 hours. Cardiac Enzymes: No results for input(s): CKTOTAL, CKMB, CKMBINDEX, TROPONINI in the last 168 hours. BNP (last 3 results) No results for input(s): PROBNP in the last 8760 hours. HbA1C: No results for input(s): HGBA1C in the last 72 hours. CBG: Recent Labs  Lab 02/11/19 0901 02/11/19 1151 02/11/19 1523 02/11/19 1942 02/12/19 0353  GLUCAP 126* 125* 105* 111* 99   Lipid Profile: No results for input(s): CHOL, HDL, LDLCALC, TRIG, CHOLHDL, LDLDIRECT in the last 72 hours. Thyroid Function Tests: No results for input(s): TSH, T4TOTAL, FREET4, T3FREE, THYROIDAB in the last 72 hours. Anemia Panel: No results for input(s): VITAMINB12, FOLATE, FERRITIN, TIBC, IRON, RETICCTPCT in the last 72 hours. Sepsis Labs: Recent Labs  Lab 02/11/19 0857 02/11/19 1213  LATICACIDVEN 0.8 1.1    Recent  Results (from the past 240 hour(s))  SARS Coronavirus 2 [facility] order, Performed in [facility] hospital lab)     Status: None   Collection Time: 02/03/19  7:46 PM  Result Value Ref Range Status   SARS Coronavirus 2 NEGATIVE NEGATIVE Final    Comment: (NOTE) If result is NEGATIVE SARS-CoV-2 target nucleic acids are NOT DETECTED. The SARS-CoV-2 RNA is generally detectable in upper and lower  respiratory specimens during the acute phase of infection. The  lowest  concentration of SARS-CoV-2 viral copies this assay can detect is 250  copies / mL. A negative result does not preclude SARS-CoV-2 infection  and should not be used as the sole basis for treatment or other  patient management decisions.  A negative result may occur with  improper specimen collection / handling, submission of specimen other  than nasopharyngeal swab, presence of viral mutation(s) within the  areas targeted by this assay, and inadequate number of viral copies  (<250 copies / mL). A negative result must be combined with clinical  observations, patient history, and epidemiological information. If result is POSITIVE SARS-CoV-2 target nucleic acids are DETECTED. The SARS-CoV-2 RNA is generally detectable in upper and lower  respiratory specimens dur ing the acute phase of infection.  Positive  results are indicative of active infection with SARS-CoV-2.  Clinical  correlation with patient history and other diagnostic information is  necessary to determine patient infection status.  Positive results do  not rule out bacterial infection or co-infection with other viruses. If result is PRESUMPTIVE POSTIVE SARS-CoV-2 nucleic acids MAY BE PRESENT.   A presumptive positive result was obtained on the submitted specimen  and confirmed on repeat testing.  While 2019 novel coronavirus  (SARS-CoV-2) nucleic acids may be present in the submitted sample  additional confirmatory testing may be necessary for epidemiological  and  / or clinical management purposes  to differentiate between  SARS-CoV-2 and other Sarbecovirus currently known to infect humans.  If clinically indicated additional testing with an alternate test  methodology [phone]) is advised. The SARS-CoV-2 RNA is generally  detectable in upper and lower respiratory sp ecimens during the acute  phase of infection. The expected result is Negative. Fact Sheet for Patients:  StrictlyIdeas.no Fact Sheet for Healthcare Providers: BankingDealers.co.za This test is not yet approved or cleared by the [place] FDA and has been authorized for detection and/or diagnosis of SARS-CoV-2 by FDA under an Emergency Use Authorization (EUA).  This EUA will remain in effect (meaning this test can be used) for the duration of the COVID-19 declaration under Section 564(b)(1) of the Act, 21 U.S.C. section 360bbb-3(b)(1), unless the authorization is terminated or revoked sooner. Performed at [facility], [place] [address], [place], [place] [address]   MRSA PCR Screening     Status: None   Collection Time: 02/04/19 11:27 AM  Result Value Ref Range Status   MRSA by PCR NEGATIVE NEGATIVE Final    Comment:        The GeneXpert MRSA Assay (FDA approved for NASAL specimens only), is one component of a comprehensive MRSA colonization surveillance program. It is not intended to diagnose MRSA infection nor to guide or monitor treatment for MRSA infections. Performed at [facility], [place] [address] [place], [place] [address]        Radiology Studies: Dg Chest Port 1 View  Result Date: 02/11/2019 CLINICAL DATA:  Rhonchi. EXAM: PORTABLE CHEST 1 VIEW COMPARISON:  Radiograph of February 04, 2019. FINDINGS: The heart size and mediastinal contours are within normal limits. Both lungs are clear. No pneumothorax or pleural effusion is noted. Old left rib fractures are noted. IMPRESSION: No active disease.  Electronically Signed   By: Marijo Conception M.D.   On: 02/11/2019 15:39      Scheduled Meds: . LORazepam  0.5 mg Intravenous Q12H  . pantoprazole  40 mg Oral Daily  . phenytoin (DILANTIN) IV  100 mg Intravenous Q8H  . topiramate  200  mg Oral BID   Continuous Infusions: . sodium chloride 100 mL/hr at 02/12/19 0727  . clindamycin (CLEOCIN) IV 600 mg (02/12/19 0609)  . lacosamide (VIMPAT) IV 200 mg (02/11/19 2128)  . levETIRAcetam 1,500 mg (02/12/19 0516)     LOS: 8 days    Time spent: 30 minutes spent in the coordination of care today.    Jonnie Finner, DO [facility] Pager [phone]  If 7PM-7AM, please contact night-coverage [email or web address] Password [email or web address] 02/12/2019, 8:19 AM

## 2019-02-12 NOTE — Progress Notes (Signed)
Sitter called nurse to room to look at IV dressing around IV was wet and Clindamycin was running. No swelling or discoloration at site noted. IV team notified.

## 2019-02-12 NOTE — Progress Notes (Signed)
NP on-call Baltazar Najjar notified of pt's UA results and new order received. Will continue to closely monitor. Delia Heady RN

## 2019-02-12 NOTE — Progress Notes (Signed)
CBG checked at 0000 was 114.

## 2019-02-13 DIAGNOSIS — R569 Unspecified convulsions: Secondary | ICD-10-CM

## 2019-02-13 LAB — CBC WITH DIFFERENTIAL/PLATELET
Abs Immature Granulocytes: 0.12 10*3/uL — ABNORMAL HIGH (ref 0.00–0.07)
Basophils Absolute: 0 10*3/uL (ref 0.0–0.1)
Basophils Relative: 0 %
Eosinophils Absolute: 0.1 10*3/uL (ref 0.0–0.5)
Eosinophils Relative: 1 %
HCT: 36.2 % — ABNORMAL LOW (ref 39.0–52.0)
Hemoglobin: 12 g/dL — ABNORMAL LOW (ref 13.0–17.0)
Immature Granulocytes: 1 %
Lymphocytes Relative: 11 %
Lymphs Abs: 1.4 10*3/uL (ref 0.7–4.0)
MCH: 30.7 pg (ref 26.0–34.0)
MCHC: 33.1 g/dL (ref 30.0–36.0)
MCV: 92.6 fL (ref 80.0–100.0)
Monocytes Absolute: 1 10*3/uL (ref 0.1–1.0)
Monocytes Relative: 8 %
Neutro Abs: 10.4 10*3/uL — ABNORMAL HIGH (ref 1.7–7.7)
Neutrophils Relative %: 79 %
Platelets: 219 10*3/uL (ref 150–400)
RBC: 3.91 MIL/uL — ABNORMAL LOW (ref 4.22–5.81)
RDW: 12.5 % (ref 11.5–15.5)
WBC: 13 10*3/uL — ABNORMAL HIGH (ref 4.0–10.5)
nRBC: 0 % (ref 0.0–0.2)

## 2019-02-13 LAB — RENAL FUNCTION PANEL
Albumin: 3.3 g/dL — ABNORMAL LOW (ref 3.5–5.0)
Anion gap: 15 (ref 5–15)
BUN: 7 mg/dL (ref 6–20)
CO2: 16 mmol/L — ABNORMAL LOW (ref 22–32)
Calcium: 8.9 mg/dL (ref 8.9–10.3)
Chloride: 107 mmol/L (ref 98–111)
Creatinine, Ser: 0.82 mg/dL (ref 0.61–1.24)
GFR calc Af Amer: >60 mL/min (ref >60)
GFR calc non Af Amer: >60 mL/min (ref >60)
Glucose, Bld: 120 mg/dL — ABNORMAL HIGH (ref 70–99)
Phosphorus: 2.8 mg/dL (ref 2.5–4.6)
Potassium: 4.1 mmol/L (ref 3.5–5.1)
Sodium: 138 mmol/L (ref 135–145)

## 2019-02-13 LAB — GLUCOSE, CAPILLARY
Glucose-Capillary: 104 mg/dL — ABNORMAL HIGH (ref 70–99)
Glucose-Capillary: 105 mg/dL — ABNORMAL HIGH (ref 70–99)
Glucose-Capillary: 109 mg/dL — ABNORMAL HIGH (ref 70–99)
Glucose-Capillary: 114 mg/dL — ABNORMAL HIGH (ref 70–99)
Glucose-Capillary: 139 mg/dL — ABNORMAL HIGH (ref 70–99)
Glucose-Capillary: 154 mg/dL — ABNORMAL HIGH (ref 70–99)

## 2019-02-13 LAB — MAGNESIUM: Magnesium: 2.1 mg/dL (ref 1.7–2.4)

## 2019-02-13 MED ORDER — LORAZEPAM 2 MG/ML IJ SOLN
2.0000 mg | Freq: Once | INTRAMUSCULAR | Status: AC
  Administered 2019-02-13: 2 mg via INTRAVENOUS
  Filled 2019-02-13: qty 1

## 2019-02-13 MED ORDER — LORAZEPAM 2 MG/ML IJ SOLN
1.0000 mg | Freq: Once | INTRAMUSCULAR | Status: AC
  Administered 2019-02-13: 01:00:00 1 mg via INTRAVENOUS
  Filled 2019-02-13: qty 1

## 2019-02-13 NOTE — Progress Notes (Signed)
SLP Cancellation Note  Patient Details Name: Travis Briggs. MRN: [id number] DOB: October 04, 1976   Cancelled treatment:       Reason Eval/Treat Not Completed: Medical issues which prohibited therapy. Patient agitated and combative with nursing and was not appropriate for speech therapy. Will attempt next date.   Nadara Mode [name] 02/13/2019, 4:32 PM    [name], MA, CCC-SLP Speech Therapy [facility] Acute Rehab Pager: [phone]

## 2019-02-13 NOTE — Progress Notes (Signed)
PT Cancellation Note  Patient Details Name: Travis Briggs. MRN: [id number] DOB: 22-Dec-1975   Cancelled Treatment:    Reason Eval/Treat Not Completed: Other (comment) Attempted to see pt for PT tx however pt is very restless, easily agitated, and pulling mittens off with his teeth upon arrival. Pt refused mobilizing at this time. Assisted sitter with donning restraints. PT will continue to follow acutely.    Earney Navy, PTA Acute Rehabilitation Services Pager: [phone] Office: [phone]   02/13/2019, 3:28 PM

## 2019-02-13 NOTE — Progress Notes (Signed)
Pt very irritable and agitated; climbing oob with sitter in room; trying to pull on lines. NP on-call paged and notified. Will continue to closely monitor. Delia Heady RN

## 2019-02-13 NOTE — Progress Notes (Signed)
Subjective: Sitter in room with patient. Alert and cooperative today.   Objective: Current vital signs: BP 127/89   Pulse 90   Temp 98.6 F (37 C) (Oral)   Resp 18   Ht 5' 9"  (1.753 m)   Wt 73.1 kg   SpO2 100%   BMI 23.80 kg/m  Vital signs in last 24 hours: Temp:  [98.1 F (36.7 C)-99.8 F (37.7 C)] 98.6 F (37 C) (05/06 5400) Pulse Rate:  [88-109] 90 (05/06 0812) Resp:  [16-18] 18 (05/06 0325) BP: (106-129)/(64-89) 127/89 (05/06 0812) SpO2:  [100 %] 100 % (05/06 0812)  Intake/Output from previous day: 05/05 0701 - 05/06 0700 In: 3253.3 [P.O.:876; I.V.:1839.3; IV Piggyback:538] Out: 2425 [Urine:2425] Intake/Output this shift: Total I/O In: -  Out: 800 [Urine:800] Nutritional status:  Diet Order            DIET DYS 3 Room service appropriate? No; Fluid consistency: Thin  Diet effective now             HEENT: Scarring and skull-shape irregularities consistent with prior TBI.   Lungs: Respirations unlabored Ext: No edema  Neurologic Exam: Ment: Awake and alert. Increased latency of verbal and motor responses, poor attention and concentration. Able to follow simple motor commands. Frequently perseverative with answers to questions.  CN: EOMI. Fixates and tracks with saccadic visual pursuits to left and right. Face with mild asymmetry.  Motor:  5/5 BUE with bradykinetic movements and increased latency of motor responses.  5/5 LLE and RLE with  bradykinetic movements and increased latency of motor responses. Tends to move RLE more slowly and with greater latency than on the left.   Lab Results: Results for orders placed or performed during the hospital encounter of 02/03/19 (from the past 48 hour(s))  Glucose, capillary     Status: Abnormal   Collection Time: 02/11/19  3:23 PM  Result Value Ref Range   Glucose-Capillary 105 (H) 70 - 99 mg/dL  Glucose, capillary     Status: Abnormal   Collection Time: 02/11/19  7:42 PM  Result Value Ref Range   Glucose-Capillary  111 (H) 70 - 99 mg/dL  Culture, blood (routine x 2)     Status: None (Preliminary result)   Collection Time: 02/11/19 11:23 PM  Result Value Ref Range   Specimen Description BLOOD RIGHT ANTECUBITAL    Special Requests      BOTTLES DRAWN AEROBIC ONLY Blood Culture adequate volume   Culture      NO GROWTH 1 DAY Performed at [facility] [address], [place], [place] [address]    Report Status PENDING   Culture, blood (routine x 2)     Status: None (Preliminary result)   Collection Time: 02/11/19 11:29 PM  Result Value Ref Range   Specimen Description BLOOD RIGHT ANTECUBITAL    Special Requests      BOTTLES DRAWN AEROBIC ONLY Blood Culture adequate volume   Culture  Setup Time      GRAM POSITIVE RODS AEROBIC BOTTLE ONLY CRITICAL RESULT CALLED TO, READ BACK BY AND VERIFIED WITH: PHRMD J ORIET @0544  02/13/19 BY S GEZAHEGN    Culture      NO GROWTH 1 DAY Performed at [facility], [place] [address] [place], [place] [address]    Report Status PENDING   Glucose, capillary     Status: Abnormal   Collection Time: 02/12/19 12:04 AM  Result Value Ref Range   Glucose-Capillary 118 (H) 70 - 99 mg/dL  Comment 1 Notify RN   Glucose, capillary     Status: None   Collection Time: 02/12/19  3:53 AM  Result Value Ref Range   Glucose-Capillary 99 70 - 99 mg/dL  Basic metabolic panel     Status: Abnormal   Collection Time: 02/12/19  5:35 AM  Result Value Ref Range   Sodium 138 135 - 145 mmol/L   Potassium 3.8 3.5 - 5.1 mmol/L   Chloride 112 (H) 98 - 111 mmol/L   CO2 18 (L) 22 - 32 mmol/L   Glucose, Bld 121 (H) 70 - 99 mg/dL   BUN 8 6 - 20 mg/dL   Creatinine, Ser 0.95 0.61 - 1.24 mg/dL   Calcium 8.7 (L) 8.9 - 10.3 mg/dL   GFR calc non Af Amer >60 >60 mL/min   GFR calc Af Amer >60 >60 mL/min   Anion gap 8 5 - 15    Comment: Performed at [facility], [place] [address] [place], [place] [address]  Magnesium     Status: None   Collection Time: 02/12/19  5:35 AM  Result  Value Ref Range   Magnesium 2.0 1.7 - 2.4 mg/dL    Comment: Performed at [facility] [address], [place], [place] [address]  CBC     Status: Abnormal   Collection Time: 02/12/19  5:35 AM  Result Value Ref Range   WBC 17.4 (H) 4.0 - 10.5 K/uL   RBC 3.95 (L) 4.22 - 5.81 MIL/uL   Hemoglobin 12.3 (L) 13.0 - 17.0 g/dL   HCT 36.5 (L) 39.0 - 52.0 %   MCV 92.4 80.0 - 100.0 fL   MCH 31.1 26.0 - 34.0 pg   MCHC 33.7 30.0 - 36.0 g/dL   RDW 12.2 11.5 - 15.5 %   Platelets 196 150 - 400 K/uL   nRBC 0.0 0.0 - 0.2 %    Comment: Performed at [facility], [place] [address] [place], [place] [address]  Phenytoin level, total     Status: None   Collection Time: 02/12/19  5:35 AM  Result Value Ref Range   Phenytoin Lvl 14.4 10.0 - 20.0 ug/mL    Comment: Performed at [facility] [address] [place], [place] [address]  Albumin     Status: Abnormal   Collection Time: 02/12/19  5:35 AM  Result Value Ref Range   Albumin 3.2 (L) 3.5 - 5.0 g/dL    Comment: Performed at [facility], [place] [address], [place], [place] [address]  Glucose, capillary     Status: Abnormal   Collection Time: 02/12/19  8:54 AM  Result Value Ref Range   Glucose-Capillary 106 (H) 70 - 99 mg/dL  Urinalysis, Routine w reflex microscopic     Status: Abnormal   Collection Time: 02/12/19  9:30 AM  Result Value Ref Range   Color, Urine YELLOW YELLOW   APPearance HAZY (A) CLEAR   Specific Gravity, Urine 1.017 1.005 - 1.030   pH 8.0 5.0 - 8.0   Glucose, UA NEGATIVE NEGATIVE mg/dL   Hgb urine dipstick NEGATIVE NEGATIVE   Bilirubin Urine NEGATIVE NEGATIVE   Ketones, ur 5 (A) NEGATIVE mg/dL   Protein, ur NEGATIVE NEGATIVE mg/dL   Nitrite POSITIVE (A) NEGATIVE   Leukocytes,Ua MODERATE (A) NEGATIVE   RBC / HPF 6-10 0 - 5 RBC/hpf   WBC, UA 21-50 0 - 5 WBC/hpf   Bacteria, UA MANY (A) NONE SEEN   Squamous Epithelial /  LPF 0-5 0 - 5   Mucus PRESENT    Triple Phosphate Crystal PRESENT     Comment:  Performed at [facility], [place] [address], [place], [place] [address]  Glucose, capillary     Status: None   Collection Time: 02/12/19 11:28 AM  Result Value Ref Range   Glucose-Capillary 85 70 - 99 mg/dL  Glucose, capillary     Status: Abnormal   Collection Time: 02/12/19  4:01 PM  Result Value Ref Range   Glucose-Capillary 113 (H) 70 - 99 mg/dL  Glucose, capillary     Status: Abnormal   Collection Time: 02/12/19  7:47 PM  Result Value Ref Range   Glucose-Capillary 123 (H) 70 - 99 mg/dL  Glucose, capillary     Status: Abnormal   Collection Time: 02/12/19 11:43 PM  Result Value Ref Range   Glucose-Capillary 139 (H) 70 - 99 mg/dL   Comment 1 Notify RN    Comment 2 Document in Chart   Glucose, capillary     Status: Abnormal   Collection Time: 02/13/19  3:19 AM  Result Value Ref Range   Glucose-Capillary 114 (H) 70 - 99 mg/dL  CBC with Differential/Platelet     Status: Abnormal   Collection Time: 02/13/19  3:54 AM  Result Value Ref Range   WBC 13.0 (H) 4.0 - 10.5 K/uL   RBC 3.91 (L) 4.22 - 5.81 MIL/uL   Hemoglobin 12.0 (L) 13.0 - 17.0 g/dL   HCT 36.2 (L) 39.0 - 52.0 %   MCV 92.6 80.0 - 100.0 fL   MCH 30.7 26.0 - 34.0 pg   MCHC 33.1 30.0 - 36.0 g/dL   RDW 12.5 11.5 - 15.5 %   Platelets 219 150 - 400 K/uL   nRBC 0.0 0.0 - 0.2 %   Neutrophils Relative % 79 %   Neutro Abs 10.4 (H) 1.7 - 7.7 K/uL   Lymphocytes Relative 11 %   Lymphs Abs 1.4 0.7 - 4.0 K/uL   Monocytes Relative 8 %   Monocytes Absolute 1.0 0.1 - 1.0 K/uL   Eosinophils Relative 1 %   Eosinophils Absolute 0.1 0.0 - 0.5 K/uL   Basophils Relative 0 %   Basophils Absolute 0.0 0.0 - 0.1 K/uL   Immature Granulocytes 1 %   Abs Immature Granulocytes 0.12 (H) 0.00 - 0.07 K/uL    Comment: Performed at [facility], [address], [place], [place] [address]  Magnesium     Status: None   Collection Time: 02/13/19  3:54 AM  Result Value Ref Range   Magnesium 2.1 1.7 - 2.4 mg/dL    Comment: Performed at [facility] [address], [place], [place] [address]  Renal function panel     Status: Abnormal   Collection Time: 02/13/19  3:54 AM  Result Value Ref Range   Sodium 138 135 - 145 mmol/L   Potassium 4.1 3.5 - 5.1 mmol/L   Chloride 107 98 - 111 mmol/L   CO2 16 (L) 22 - 32 mmol/L   Glucose, Bld 120 (H) 70 - 99 mg/dL   BUN 7 6 - 20 mg/dL   Creatinine, Ser 0.82 0.61 - 1.24 mg/dL   Calcium 8.9 8.9 - 10.3 mg/dL   Phosphorus 2.8 2.5 - 4.6 mg/dL   Albumin 3.3 (L) 3.5 - 5.0 g/dL   GFR calc non Af Amer >60 >60 mL/min   GFR calc Af Amer >60 >60 mL/min   Anion gap 15 5 -  15    Comment: Performed at [facility], [place] [address], [place], [place] [address]  Glucose, capillary     Status: Abnormal   Collection Time: 02/13/19  8:07 AM  Result Value Ref Range   Glucose-Capillary 154 (H) 70 - 99 mg/dL   Comment 1 Notify RN    Comment 2 Document in Chart   Glucose, capillary     Status: Abnormal   Collection Time: 02/13/19 11:27 AM  Result Value Ref Range   Glucose-Capillary 105 (H) 70 - 99 mg/dL   Comment 1 Notify RN    Comment 2 Document in Chart     Recent Results (from the past 240 hour(s))  SARS Coronavirus 2 [facility] order, Performed in [place] hospital lab)     Status: None   Collection Time: 02/03/19  7:46 PM  Result Value Ref Range Status   SARS Coronavirus 2 NEGATIVE NEGATIVE Final    Comment: (NOTE) If result is NEGATIVE SARS-CoV-2 target nucleic acids are NOT DETECTED. The SARS-CoV-2 RNA is generally detectable in upper and lower  respiratory specimens during the acute phase of infection. The lowest  concentration of SARS-CoV-2 viral copies this assay can detect is 250  copies / mL. A negative result does not preclude SARS-CoV-2 infection  and should not be used as the sole basis for treatment or other  patient management decisions.  A negative result may occur with  improper specimen collection / handling, submission of specimen other  than nasopharyngeal swab,  presence of viral mutation(s) within the  areas targeted by this assay, and inadequate number of viral copies  (<250 copies / mL). A negative result must be combined with clinical  observations, patient history, and epidemiological information. If result is POSITIVE SARS-CoV-2 target nucleic acids are DETECTED. The SARS-CoV-2 RNA is generally detectable in upper and lower  respiratory specimens dur ing the acute phase of infection.  Positive  results are indicative of active infection with SARS-CoV-2.  Clinical  correlation with patient history and other diagnostic information is  necessary to determine patient infection status.  Positive results do  not rule out bacterial infection or co-infection with other viruses. If result is PRESUMPTIVE POSTIVE SARS-CoV-2 nucleic acids MAY BE PRESENT.   A presumptive positive result was obtained on the submitted specimen  and confirmed on repeat testing.  While 2019 novel coronavirus  (SARS-CoV-2) nucleic acids may be present in the submitted sample  additional confirmatory testing may be necessary for epidemiological  and / or clinical management purposes  to differentiate between  SARS-CoV-2 and other Sarbecovirus currently known to infect humans.  If clinically indicated additional testing with an alternate test  methodology [phone]) is advised. The SARS-CoV-2 RNA is generally  detectable in upper and lower respiratory sp ecimens during the acute  phase of infection. The expected result is Negative. Fact Sheet for Patients:  StrictlyIdeas.no Fact Sheet for Healthcare Providers: BankingDealers.co.za This test is not yet approved or cleared by the [place] FDA and has been authorized for detection and/or diagnosis of SARS-CoV-2 by FDA under an Emergency Use Authorization (EUA).  This EUA will remain in effect (meaning this test can be used) for the duration of the COVID-19 declaration under  Section 564(b)(1) of the Act, 21 U.S.C. section 360bbb-3(b)(1), unless the authorization is terminated or revoked sooner. Performed at [facility], [place] [address] [place], [place] [address]   MRSA PCR Screening     Status: None   Collection Time: 02/04/19  11:27 AM  Result Value Ref Range Status   MRSA by PCR NEGATIVE NEGATIVE Final    Comment:        The GeneXpert MRSA Assay (FDA approved for NASAL specimens only), is one component of a comprehensive MRSA colonization surveillance program. It is not intended to diagnose MRSA infection nor to guide or monitor treatment for MRSA infections. Performed at [facility], [place] [address], [place], [place] [address]   Culture, blood (routine x 2)     Status: None (Preliminary result)   Collection Time: 02/11/19 11:23 PM  Result Value Ref Range Status   Specimen Description BLOOD RIGHT ANTECUBITAL  Final   Special Requests   Final    BOTTLES DRAWN AEROBIC ONLY Blood Culture adequate volume   Culture   Final    NO GROWTH 1 DAY Performed at [facility], [place] [address], [place], [place] [address]    Report Status PENDING  Incomplete  Culture, blood (routine x 2)     Status: None (Preliminary result)   Collection Time: 02/11/19 11:29 PM  Result Value Ref Range Status   Specimen Description BLOOD RIGHT ANTECUBITAL  Final   Special Requests   Final    BOTTLES DRAWN AEROBIC ONLY Blood Culture adequate volume   Culture  Setup Time   Final    GRAM POSITIVE RODS AEROBIC BOTTLE ONLY CRITICAL RESULT CALLED TO, READ BACK BY AND VERIFIED WITH: PHRMD J ORIET @0544  02/13/19 BY S GEZAHEGN    Culture   Final    NO GROWTH 1 DAY Performed at [facility], [place] [address], [place],  [address]    Report Status PENDING  Incomplete    Lipid Panel No results for input(s): CHOL, TRIG, HDL, CHOLHDL, VLDL, LDLCALC in the last 72 hours.  Studies/Results: Dg Chest Port 1 View  Result Date:  02/11/2019 CLINICAL DATA:  [facility]honchi. EXAM: PORTABLE CHEST 1 VIEW COMPARISON:  Radiograph of February 04, 2019. FINDINGS: The heart size and mediastinal contours are within normal limits. Both lungs are clear. No pneumothorax or pleural effusion is noted. Old left rib fractures are noted. IMPRESSION: No active disease. Electronically Signed   By: Marijo Conception M.D.   On: 02/11/2019 15:39    Medications:  Scheduled: . LORazepam  0.5 mg Intravenous Q12H  . pantoprazole  40 mg Oral Daily  . phenytoin (DILANTIN) IV  100 mg Intravenous Q8H  . topiramate  200 mg Oral BID   Continuous: . sodium chloride 50 mL/hr at 02/13/19 1341  . cefTRIAXone (ROCEPHIN)  IV Stopped (02/12/19 2159)  . clindamycin (CLEOCIN) IV 600 mg (02/13/19 1341)  . lacosamide (VIMPAT) IV 200 mg (02/13/19 0901)  . 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 Exam today with alert, awake patient exhibiting increased latencies of verbal and motor responses, decreased attention and concentration but no findings suggestive of a delirium or acute encephalopathy.  2. Continues to be seizure-free on current medication regimen. Phenytoin level was therapeutic yesterday at 14.4.   Recommendations: 1. Continue Keppra to 1500 mg BID 2. Continue Dilantin at 100 mg TID 3. Continue Topamax 200 mg BID 4. Continue Vimpat 200 mg BID 5. Discontinuing Ativan today 6. Continue seizure precautions. 7. Goal will be to eventually taper his 4 AEDs to 2 agents now that he isno longer having seizures. However this  would need to be done over weeksto prevent rebound seizures. Agree with placement and close follow-up with neurologist as outpatient to further taper medications. Would first taper off Vimpat and then Topamax, with final regimen of Keppra and Dilantin.  8. Neurology will sign off. Please call if there  are additional questions.    LOS: 9 days   @Electronically  signed: Dr. Kerney Elbe 02/13/2019  2:01 PM

## 2019-02-13 NOTE — Progress Notes (Addendum)
Patient ID: Andrey Farmer., male   DOB: 09/23/1976, 43 y.o.   MRN: [id number]  PROGRESS NOTE    [name]  BSW:[id number] DOB: Feb 17, 1976 DOA: 02/03/2019 PCP: System, Pcp Not In   Brief Narrative:  43 year old male with history of traumatic brain injury status post craniotomy/cranioplasty presented with seizures.  He was found to have status epilepticus.  Neurology was consulted.  He was started on antiepileptics.  Assessment & Plan:   Principal Problem:   Status epilepticus ([place]) Active Problems:   Dental cyst   Stroke (cerebrum) (HCC)   Thrombocytopenia (HCC)  Status epilepticus in a patient with history of traumatic brain injury status post craniotomy/cranioplasty: Improving Todd's paresis: Improving -Improving.  Neurology following.  Currently on Keppra, Dilantin, Vimpat and Topamax.  Ativan is also being tapered by neurology.  Neurology also hoping to de-escalate to 2 antiepileptic agents. -Continue seizure precautions. -02/09/19: EEG showed no seizures  Acute encephalopathy -Probably secondary to above. -Patient has poor memory at baseline along with word finding difficulty/aphasia.  Patient is supposed to wear cranial helmet.  Dental cyst -CT showing cystic lesion involving the right mandible which is associated with right lower premolar dental root.  This is thought to be most likely a dental cyst which could be due to radicular cyst or chronic infection. -We will need outpatient dentistry evaluation and follow-up -Currently on clindamycin -afebrile currently  Leukocytosis -Improving.  Gram-positive rod bacteremia: 1 out of 4 bottles positive -Probably contaminant.  Probable UTI -Urine culture pending.  Continue Rocephin    DVT prophylaxis: SCDs Code Status: Full Family Communication: None at bedside Disposition Plan: May need SNF  Consultants: Neurology  Procedures: EEG  Antimicrobials:  Anti-infectives (From admission, onward)   Start     Dose/Rate Route Frequency Ordered Stop   02/12/19 2030  cefTRIAXone (ROCEPHIN) 2 g in sodium chloride 0.9 % 100 mL IVPB     2 g 200 mL/hr over 30 Minutes Intravenous Every 24 hours 02/12/19 1947     02/11/19 2200  clindamycin (CLEOCIN) IVPB 600 mg     600 mg 100 mL/hr over 30 Minutes Intravenous Every 8 hours 02/11/19 2051         Subjective: Patient seen and examined at bedside.  He is awake and tries to answer some questions but does not make sense.  No current seizures.  No overnight fever or vomiting.  Objective: Vitals:   02/12/19 2207 02/12/19 2345 02/13/19 0325 02/13/19 0812  BP: 127/84 122/73 129/64 127/89  Pulse: 91 (!) 109 88 90  Resp: 16 18 18    Temp: 98.3 F (36.8 C) 98.1 F (36.7 C) 98.5 F (36.9 C) 98.6 F (37 C)  TempSrc: Oral Oral Oral Oral  SpO2: 100% 100% 100% 100%  Weight:      Height:        Intake/Output Summary (Last 24 hours) at 02/13/2019 1212 Last data filed at 02/13/2019 0643 Gross per 24 hour  Intake 3017.34 ml  Output 2425 ml  Net 592.34 ml   Filed Weights   02/03/19 1642 02/09/19 0305  Weight: 72.6 kg 73.1 kg    Examination:  General exam: Appears calm and comfortable.  Looks older than stated age.  Does not answer questions. Respiratory system: Bilateral decreased breath sounds at bases Cardiovascular system: S1 & S2 heard, Rate controlled Gastrointestinal system: Abdomen is nondistended, soft and nontender. Normal bowel sounds heard. Extremities: No cyanosis, clubbing, edema     Data Reviewed: I have personally reviewed following  labs and imaging studies  CBC: Recent Labs  Lab 02/10/19 0444 02/11/19 0441 02/11/19 1213 02/12/19 0535 02/13/19 0354  WBC 7.3 16.1* 19.1* 17.4* 13.0*  NEUTROABS  --   --   --   --  10.4*  HGB 13.5 13.7 12.9* 12.3* 12.0*  HCT 40.2 41.5 38.8* 36.5* 36.2*  MCV 92.6 91.4 91.9 92.4 92.6  PLT 225 213 202 196 371   Basic Metabolic Panel: Recent Labs  Lab 02/09/19 0426 02/10/19 0444  02/11/19 0441 02/12/19 0535 02/13/19 0354  NA 138 139 137 138 138  K 3.9 4.0 4.0 3.8 4.1  CL 109 110 109 112* 107  CO2 19* 21* 18* 18* 16*  GLUCOSE 108* 108* 115* 121* 120*  BUN 13 13 10 8 7   CREATININE 0.97 1.01 1.08 0.95 0.82  CALCIUM 9.1 9.2 9.2 8.7* 8.9  MG 2.1 2.1 2.0 2.0 2.1  PHOS  --   --   --   --  2.8   GFR: Estimated Creatinine Clearance: 115 mL/min (by C-G formula based on SCr of 0.82 mg/dL). Liver Function Tests: Recent Labs  Lab 02/12/19 0535 02/13/19 0354  ALBUMIN 3.2* 3.3*   No results for input(s): LIPASE, AMYLASE in the last 168 hours. No results for input(s): AMMONIA in the last 168 hours. Coagulation Profile: No results for input(s): INR, PROTIME in the last 168 hours. Cardiac Enzymes: No results for input(s): CKTOTAL, CKMB, CKMBINDEX, TROPONINI in the last 168 hours. BNP (last 3 results) No results for input(s): PROBNP in the last 8760 hours. HbA1C: No results for input(s): HGBA1C in the last 72 hours. CBG: Recent Labs  Lab 02/12/19 1601 02/12/19 1947 02/12/19 2343 02/13/19 0319 02/13/19 0807  GLUCAP 113* 123* 139* 114* 154*   Lipid Profile: No results for input(s): CHOL, HDL, LDLCALC, TRIG, CHOLHDL, LDLDIRECT in the last 72 hours. Thyroid Function Tests: No results for input(s): TSH, T4TOTAL, FREET4, T3FREE, THYROIDAB in the last 72 hours. Anemia Panel: No results for input(s): VITAMINB12, FOLATE, FERRITIN, TIBC, IRON, RETICCTPCT in the last 72 hours. Sepsis Labs: Recent Labs  Lab 02/11/19 0857 02/11/19 1213  LATICACIDVEN 0.8 1.1    Recent Results (from the past 240 hour(s))  SARS Coronavirus 2 [facility] order, Performed in [facility] hospital lab)     Status: None   Collection Time: 02/03/19  7:46 PM  Result Value Ref Range Status   SARS Coronavirus 2 NEGATIVE NEGATIVE Final    Comment: (NOTE) If result is NEGATIVE SARS-CoV-2 target nucleic acids are NOT DETECTED. The SARS-CoV-2 RNA is generally detectable in upper and lower   respiratory specimens during the acute phase of infection. The lowest  concentration of SARS-CoV-2 viral copies this assay can detect is 250  copies / mL. A negative result does not preclude SARS-CoV-2 infection  and should not be used as the sole basis for treatment or other  patient management decisions.  A negative result may occur with  improper specimen collection / handling, submission of specimen other  than nasopharyngeal swab, presence of viral mutation(s) within the  areas targeted by this assay, and inadequate number of viral copies  (<250 copies / mL). A negative result must be combined with clinical  observations, patient history, and epidemiological information. If result is POSITIVE SARS-CoV-2 target nucleic acids are DETECTED. The SARS-CoV-2 RNA is generally detectable in upper and lower  respiratory specimens dur ing the acute phase of infection.  Positive  results are indicative of active infection with SARS-CoV-2.  Clinical  correlation with  patient history and other diagnostic information is  necessary to determine patient infection status.  Positive results do  not rule out bacterial infection or co-infection with other viruses. If result is PRESUMPTIVE POSTIVE SARS-CoV-2 nucleic acids MAY BE PRESENT.   A presumptive positive result was obtained on the submitted specimen  and confirmed on repeat testing.  While 2019 novel coronavirus  (SARS-CoV-2) nucleic acids may be present in the submitted sample  additional confirmatory testing may be necessary for epidemiological  and / or clinical management purposes  to differentiate between  SARS-CoV-2 and other Sarbecovirus currently known to infect humans.  If clinically indicated additional testing with an alternate test  methodology [phone]) is advised. The SARS-CoV-2 RNA is generally  detectable in upper and lower respiratory sp ecimens during the acute  phase of infection. The expected result is Negative. Fact  Sheet for Patients:  StrictlyIdeas.no Fact Sheet for Healthcare Providers: BankingDealers.co.za This test is not yet approved or cleared by the [place] FDA and has been authorized for detection and/or diagnosis of SARS-CoV-2 by FDA under an Emergency Use Authorization (EUA).  This EUA will remain in effect (meaning this test can be used) for the duration of the COVID-19 declaration under Section 564(b)(1) of the Act, 21 U.S.C. section 360bbb-3(b)(1), unless the authorization is terminated or revoked sooner. Performed at [facility], [place] [address] [place], [place] [address]   MRSA PCR Screening     Status: None   Collection Time: 02/04/19 11:27 AM  Result Value Ref Range Status   MRSA by PCR NEGATIVE NEGATIVE Final    Comment:        The GeneXpert MRSA Assay (FDA approved for NASAL specimens only), is one component of a comprehensive MRSA colonization surveillance program. It is not intended to diagnose MRSA infection nor to guide or monitor treatment for MRSA infections. Performed at [facility], [place] [address] [place], [place] [address]   Culture, blood (routine x 2)     Status: None (Preliminary result)   Collection Time: 02/11/19 11:23 PM  Result Value Ref Range Status   Specimen Description BLOOD RIGHT ANTECUBITAL  Final   Special Requests   Final    BOTTLES DRAWN AEROBIC ONLY Blood Culture adequate volume   Culture   Final    NO GROWTH 1 DAY Performed at [facility], [place] [address] [place], [place] [address]    Report Status PENDING  Incomplete  Culture, blood (routine x 2)     Status: None (Preliminary result)   Collection Time: 02/11/19 11:29 PM  Result Value Ref Range Status   Specimen Description BLOOD RIGHT ANTECUBITAL  Final   Special Requests   Final    BOTTLES DRAWN AEROBIC ONLY Blood Culture adequate volume   Culture  Setup Time   Final    GRAM POSITIVE RODS AEROBIC  BOTTLE ONLY CRITICAL RESULT CALLED TO, READ BACK BY AND VERIFIED WITH: PHRMD J ORIET @0544  02/13/19 BY S GEZAHEGN    Culture   Final    NO GROWTH 1 DAY Performed at [facility], [place] [address], [place],  [address]    Report Status PENDING  Incomplete         Radiology Studies: Dg Chest Port 1 View  Result Date: 02/11/2019 CLINICAL DATA:  Rhonchi. EXAM: PORTABLE CHEST 1 VIEW COMPARISON:  Radiograph of February 04, 2019. FINDINGS: The heart size and mediastinal contours are within normal limits. Both lungs are clear. No pneumothorax  or pleural effusion is noted. Old left rib fractures are noted. IMPRESSION: No active disease. Electronically Signed   By: Marijo Conception M.D.   On: 02/11/2019 15:39        Scheduled Meds: . LORazepam  0.5 mg Intravenous Q12H  . pantoprazole  40 mg Oral Daily  . phenytoin (DILANTIN) IV  100 mg Intravenous Q8H  . topiramate  200 mg Oral BID   Continuous Infusions: . sodium chloride Stopped (02/13/19 5885)  . cefTRIAXone (ROCEPHIN)  IV Stopped (02/12/19 2159)  . clindamycin (CLEOCIN) IV Stopped (02/13/19 0277)  . lacosamide (VIMPAT) IV 200 mg (02/13/19 0901)  . levETIRAcetam 400 mL/hr at 02/13/19 0643     LOS: 9 days        Aline August, MD [facility] 02/13/2019, 12:12 PM
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
## 2019-02-13 NOTE — Progress Notes (Signed)
PHARMACY - PHYSICIAN COMMUNICATION CRITICAL VALUE ALERT - BLOOD CULTURE IDENTIFICATION (BCID)  Travis Briggs. is an 43 y.o. male who presented to [name] on 02/03/2019  Assessment:  8 yom presenting with status epilepticus, dental cyst, started on clindamycin 5/4 and ceftriaxone added 5/5 for UTI. BCx resulted with 1 of 4 bottles with GPR.  Name of physician (or Provider) Contacted: messaged Alekh Kshitiz  Current antibiotics: Ceftriaxone 2g IV q24h + Clindamycin 637m IV q8h  Changes to prescribed antibiotics recommended:  No changes at this time - could be contaminant   HElicia Lamp PharmD, BCPS Please check AMION for all MRustoncontact numbers Clinical Pharmacist 02/13/2019 12:12 PM

## 2019-02-13 NOTE — Progress Notes (Signed)
Pt was irritable, restless and agitated during shift; pt awake through the night and now sleeping. Sitter remains at bedside. Will continue to closely monitor pt. Delia Heady RN

## 2019-02-14 LAB — GLUCOSE, CAPILLARY
Glucose-Capillary: 101 mg/dL — ABNORMAL HIGH (ref 70–99)
Glucose-Capillary: 101 mg/dL — ABNORMAL HIGH (ref 70–99)
Glucose-Capillary: 102 mg/dL — ABNORMAL HIGH (ref 70–99)
Glucose-Capillary: 117 mg/dL — ABNORMAL HIGH (ref 70–99)
Glucose-Capillary: 117 mg/dL — ABNORMAL HIGH (ref 70–99)
Glucose-Capillary: 122 mg/dL — ABNORMAL HIGH (ref 70–99)
Glucose-Capillary: 124 mg/dL — ABNORMAL HIGH (ref 70–99)
Glucose-Capillary: 126 mg/dL — ABNORMAL HIGH (ref 70–99)
Glucose-Capillary: 129 mg/dL — ABNORMAL HIGH (ref 70–99)
Glucose-Capillary: 129 mg/dL — ABNORMAL HIGH (ref 70–99)
Glucose-Capillary: 133 mg/dL — ABNORMAL HIGH (ref 70–99)
Glucose-Capillary: 86 mg/dL (ref 70–99)
Glucose-Capillary: 89 mg/dL (ref 70–99)
Glucose-Capillary: 96 mg/dL (ref 70–99)
Glucose-Capillary: 98 mg/dL (ref 70–99)

## 2019-02-14 LAB — MAGNESIUM: Magnesium: 2.1 mg/dL (ref 1.7–2.4)

## 2019-02-14 LAB — CBC WITH DIFFERENTIAL/PLATELET
Abs Immature Granulocytes: 0.04 10*3/uL (ref 0.00–0.07)
Basophils Absolute: 0 10*3/uL (ref 0.0–0.1)
Basophils Relative: 0 %
Eosinophils Absolute: 0.2 10*3/uL (ref 0.0–0.5)
Eosinophils Relative: 2 %
HCT: 35.2 % — ABNORMAL LOW (ref 39.0–52.0)
Hemoglobin: 11.5 g/dL — ABNORMAL LOW (ref 13.0–17.0)
Immature Granulocytes: 1 %
Lymphocytes Relative: 20 %
Lymphs Abs: 1.5 10*3/uL (ref 0.7–4.0)
MCH: 30.5 pg (ref 26.0–34.0)
MCHC: 32.7 g/dL (ref 30.0–36.0)
MCV: 93.4 fL (ref 80.0–100.0)
Monocytes Absolute: 0.9 10*3/uL (ref 0.1–1.0)
Monocytes Relative: 11 %
Neutro Abs: 5.1 10*3/uL (ref 1.7–7.7)
Neutrophils Relative %: 66 %
Platelets: 255 10*3/uL (ref 150–400)
RBC: 3.77 MIL/uL — ABNORMAL LOW (ref 4.22–5.81)
RDW: 12.7 % (ref 11.5–15.5)
WBC: 7.7 10*3/uL (ref 4.0–10.5)
nRBC: 0 % (ref 0.0–0.2)

## 2019-02-14 LAB — BASIC METABOLIC PANEL
Anion gap: 12 (ref 5–15)
BUN: 6 mg/dL (ref 6–20)
CO2: 19 mmol/L — ABNORMAL LOW (ref 22–32)
Calcium: 8.9 mg/dL (ref 8.9–10.3)
Chloride: 107 mmol/L (ref 98–111)
Creatinine, Ser: 0.86 mg/dL (ref 0.61–1.24)
GFR calc Af Amer: >60 mL/min (ref >60)
GFR calc non Af Amer: >60 mL/min (ref >60)
Glucose, Bld: 118 mg/dL — ABNORMAL HIGH (ref 70–99)
Potassium: 3.9 mmol/L (ref 3.5–5.1)
Sodium: 138 mmol/L (ref 135–145)

## 2019-02-14 MED ORDER — LORAZEPAM 2 MG/ML IJ SOLN
0.5000 mg | INTRAMUSCULAR | Status: DC | PRN
  Administered 2019-02-14 – 2019-02-21 (×5): 0.5 mg via INTRAVENOUS
  Filled 2019-02-14 (×5): qty 1

## 2019-02-14 NOTE — Progress Notes (Signed)
Physical Therapy Treatment Patient Details Name: Travis Briggs. MRN: [id number] DOB: [birth year] Today's Date: 02/14/2019    History of Present Illness 43 y/o male admitted secondary to seizure like activity at a bus stop. Per notes, pt with hx of TBI s/p craniotomy.     PT Comments    Patient agreeable to therapy and cooperative this am. Pt requires assistance for balance and cues for completion of tasks. Pt responds appropriately at times however continues to be have tangential speech. Continue to progress as tolerated with anticipated d/c to SNF for further skilled PT services.    Follow Up Recommendations  SNF     Equipment Recommendations  Other (comment)(TBD next venue)    Recommendations for Other Services       Precautions / Restrictions Precautions Precautions: Fall Precaution Comments: seizure Required Braces or Orthoses: Other Brace(helmet) Restrictions Weight Bearing Restrictions: No    Mobility  Bed Mobility Overal bed mobility: Needs Assistance Bed Mobility: Supine to Sit     Supine to sit: Min guard     General bed mobility comments: min guard for safety; increased time due to pt getting distracted by lines/sheet/gown  Transfers Overall transfer level: Needs assistance Equipment used: 1 person hand held assist Transfers: Sit to/from Stand Sit to Stand: Min assist;+2 safety/equipment         General transfer comment: assist to steady  Ambulation/Gait Ambulation/Gait assistance: Min assist;+2 safety/equipment   Assistive device: 1 person hand held assist(and assist at trunk with gait belt) Gait Pattern/deviations: Step-through pattern;Decreased stride length Gait velocity: Decreased    General Gait Details: assistance required for balance; pt with more equal step lengths this session   Stairs             Wheelchair Mobility    Modified Rankin (Stroke Patients Only)       Balance Overall balance assessment: Needs  assistance Sitting-balance support: Feet supported;No upper extremity supported Sitting balance-Leahy Scale: Fair     Standing balance support: During functional activity;Single extremity supported Standing balance-Leahy Scale: Poor                              Cognition Arousal/Alertness: Awake/alert Behavior During Therapy: WFL for tasks assessed/performed Overall Cognitive Status: History of cognitive impairments - at baseline(not sure if this is baseline; did not know son on the phone) Area of Impairment: Awareness;Orientation;Attention;Memory;Following commands;Safety/judgement;Problem solving                 Orientation Level: Place;Time;Situation Current Attention Level: Focused Memory: Decreased short-term memory Following Commands: Follows one step commands consistently;Follows one step commands with increased time Safety/Judgement: Decreased awareness of safety;Decreased awareness of deficits Awareness: Intellectual Problem Solving: Difficulty sequencing;Requires verbal cues General Comments: pt with hx of TBI and currently requires mod cues for basic task. pt initiates but needs (A) to sustain task      Exercises      General Comments        Pertinent Vitals/Pain Pain Assessment: Faces Faces Pain Scale: No hurt    Home Living                      Prior Function            PT Goals (current goals can now be found in the care plan section) Progress towards PT goals: Progressing toward goals    Frequency    Min 3X/week  PT Plan Current plan remains appropriate    Co-evaluation              AM-PAC PT "6 Clicks" Mobility   Outcome Measure  Help needed turning from your back to your side while in a flat bed without using bedrails?: A Little Help needed moving from lying on your back to sitting on the side of a flat bed without using bedrails?: A Little Help needed moving to and from a bed to a chair (including a  wheelchair)?: A Little Help needed standing up from a chair using your arms (e.g., wheelchair or bedside chair)?: A Little Help needed to walk in hospital room?: A Lot Help needed climbing 3-5 steps with a railing? : A Lot 6 Click Score: 16    End of Session Equipment Utilized During Treatment: Gait belt Activity Tolerance: Patient tolerated treatment well Patient left: with call bell/phone within reach;with nursing/sitter in room;Other (comment)(pt sitting EOB with RN and sitter present) Nurse Communication: Mobility status PT Visit Diagnosis: Unsteadiness on feet (R26.81);Other abnormalities of gait and mobility (R26.89);Muscle weakness (generalized) (M62.81);History of falling (Z91.81);Difficulty in walking, not elsewhere classified (R26.2)     Time: 6314-9702 PT Time Calculation (min) (ACUTE ONLY): 23 min  Charges:  $Gait Training: 23-37 mins                     Earney Navy, PTA Acute Rehabilitation Services Pager: [phone] Office: [phone]     Darliss Cheney 02/14/2019, 10:45 AM
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
## 2019-02-14 NOTE — Progress Notes (Signed)
Patient ID: Travis Farmer., male   DOB: September 05, 1976, 43 y.o.   MRN: [id number]  PROGRESS NOTE    Travis Blanks Levora Dredge.  URK:[id number] DOB: 1976-08-15 DOA: 02/03/2019 PCP: System, Pcp Not In   Brief Narrative:  43 year old male with history of traumatic brain injury status post craniotomy/cranioplasty presented with seizures.  He was found to have status epilepticus.  Neurology was consulted.  He was started on antiepileptics.  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  Neurology evaluation and follow-up appreciated.  Neurology has signed off.  Neurology recommends to continue current for antiepileptic agents with a goal to eventually taper to 2 agents over the next few weeks to prevent rebound seizures.  This can be done as an outpatient with close follow-up with outpatient neurologist.  Neurology recommends to first taper off Vimpat and then Topamax, with final regimen of Keppra and Dilantin.  Neurology discontinued Ativan on 02/13/2019.   -Patient had to be given IV Ativan yesterday in the afternoon for agitation.  -Continue seizure precautions. -02/09/19: EEG showed no seizures   Acute encephalopathy -Probably secondary to above. -Patient has poor memory at baseline along with word finding difficulty/aphasia.  Patient is supposed to wear cranial helmet.  Dental cyst -CT showing cystic lesion involving the right mandible which is associated with right lower premolar dental root.  This is thought to be most likely a dental cyst which could be due to radicular cyst or chronic infection. -We will need outpatient dentistry evaluation and follow-up -Currently on clindamycin -afebrile currently  Leukocytosis  -Resolved  Gram-positive rod bacteremia: 1 out of 4 bottles positive -Probably contaminant.  Probable UTI -Follow urine cultures.  Continue Rocephin    DVT prophylaxis: SCDs Code Status: Full Family Communication: None at bedside Disposition Plan: SNF once bed is available  Consultants: Neurology  Procedures: EEG  Antimicrobials:  Anti-infectives (From admission, onward)   Start     Dose/Rate Route Frequency Ordered Stop   02/12/19 2030  cefTRIAXone (ROCEPHIN) 2 g in sodium chloride 0.9 % 100 mL IVPB     2 g 200 mL/hr over 30 Minutes Intravenous Every 24 hours 02/12/19 1947     02/11/19 2200  clindamycin (CLEOCIN) IVPB 600 mg     600 mg 100 mL/hr over 30 Minutes Intravenous Every 8 hours 02/11/19 2051          Subjective: Patient seen and examined at bedside.  He is awake but a poor historian and hardly answers any questions.  No overnight seizures reported by nursing staff.  No overnight fever or vomiting.  Patient was intermittently agitated yesterday afternoon and required IV Ativan. Objective: Vitals:   02/13/19 2002 02/14/19 0033 02/14/19 0523 02/14/19 0827  BP: 116/78 114/87 106/77 115/85  Pulse: 63 76 66 83  Resp: 18 18 18 17   Temp: 98.5 F (36.9 C) 98.4 F (36.9 C) 98.7 F (37.1 C) 99 F (37.2 C)  TempSrc: Oral Oral Oral Oral  SpO2: 100% 100% 100% 100%  Weight:      Height:        Intake/Output Summary (Last 24 hours) at 02/14/2019 0839 Last data filed at 02/14/2019 0035 Gross per 24 hour  Intake 1912.81 ml  Output 1175 ml  Net 737.81 ml   Travis Briggs  Weights   02/03/19 1642 02/09/19 0305  Weight: 72.6 kg 73.1 kg    Examination:  General exam: Appears calm and comfortable.  Looks older than stated age.  Does not answer questions.  No distress Respiratory system: Bilateral decreased breath sounds at bases, no wheezing Cardiovascular system: S1 & S2 heard, Rate controlled Gastrointestinal system: Abdomen is nondistended, soft and nontender. Normal  bowel sounds heard. Extremities: No cyanosis, edema     Data Reviewed: I have personally reviewed following labs and imaging studies  CBC: Recent Labs  Lab 02/11/19 0441 02/11/19 1213 02/12/19 0535 02/13/19 0354 02/14/19 0609  WBC 16.1* 19.1* 17.4* 13.0* 7.7  NEUTROABS  --   --   --  10.4* 5.1  HGB 13.7 12.9* 12.3* 12.0* 11.5*  HCT 41.5 38.8* 36.5* 36.2* 35.2*  MCV 91.4 91.9 92.4 92.6 93.4  PLT 213 202 196 219 147   Basic Metabolic Panel: Recent Labs  Lab 02/10/19 0444 02/11/19 0441 02/12/19 0535 02/13/19 0354 02/14/19 0609  NA 139 137 138 138 138  K 4.0 4.0 3.8 4.1 3.9  CL 110 109 112* 107 107  CO2 21* 18* 18* 16* 19*  GLUCOSE 108* 115* 121* 120* 118*  BUN 13 10 8 7 6   CREATININE 1.01 1.08 0.95 0.82 0.86  CALCIUM 9.2 9.2 8.7* 8.9 8.9  MG 2.1 2.0 2.0 2.1 2.1  PHOS  --   --   --  2.8  --    GFR: Estimated Creatinine Clearance: 109.6 mL/min (by C-G formula based on SCr of 0.86 mg/dL). Liver Function Tests: Recent Labs  Lab 02/12/19 0535 02/13/19 0354  ALBUMIN 3.2* 3.3*   No results for input(s): LIPASE, AMYLASE in the last 168 hours. No results for input(s): AMMONIA in the last 168 hours. Coagulation Profile: No results for input(s): INR, PROTIME in the last 168 hours. Cardiac Enzymes: No results for input(s): CKTOTAL, CKMB, CKMBINDEX, TROPONINI in the last 168 hours. BNP (last 3 results) No results for input(s): PROBNP in the last 8760 hours. HbA1C: No results for input(s): HGBA1C in the last 72 hours. CBG: Recent Labs  Lab 02/13/19 0807 02/13/19 1127 02/13/19 1709 02/13/19 1953 02/14/19 0031  GLUCAP 154* 105* 109* 104* 133*   Lipid Profile: No results for input(s): CHOL, HDL, LDLCALC, TRIG, CHOLHDL, LDLDIRECT in the last 72 hours. Thyroid Function Tests: No results for input(s): TSH, T4TOTAL, FREET4, T3FREE, THYROIDAB in the last 72 hours. Anemia Panel: No results for input(s): VITAMINB12, FOLATE, FERRITIN, TIBC, IRON, RETICCTPCT in the last  72 hours. Sepsis Labs: Recent Labs  Lab 02/11/19 0857 02/11/19 1213  LATICACIDVEN 0.8 1.1    Recent Results (from the past 240 hour(s))  MRSA PCR Screening     Status: None   Collection Time: 02/04/19 11:27 AM  Result Value Ref Range Status   MRSA by PCR NEGATIVE NEGATIVE Final    Comment:        The GeneXpert MRSA Assay (FDA approved for NASAL specimens only), is one component of a comprehensive MRSA colonization surveillance program. It is not intended to diagnose MRSA infection nor to guide or monitor treatment for MRSA infections. Performed at [facility], [place] [address], [place], [place] [address]   Culture, blood (routine x 2)     Status: None (Preliminary result)   Collection Time: 02/11/19 11:23 PM  Result Value Ref Range Status   Specimen Description BLOOD RIGHT ANTECUBITAL  Final   Special Requests   Final    BOTTLES DRAWN AEROBIC ONLY  Blood Culture adequate volume   Culture   Final    NO GROWTH 2 DAYS Performed at [facility], [place] [address] [place], [place] [address]    Report Status PENDING  Incomplete  Culture, blood (routine x 2)     Status: None (Preliminary result)   Collection Time: 02/11/19 11:29 PM  Result Value Ref Range Status   Specimen Description BLOOD RIGHT ANTECUBITAL  Final   Special Requests   Final    BOTTLES DRAWN AEROBIC ONLY Blood Culture adequate volume   Culture  Setup Time   Final    GRAM POSITIVE RODS AEROBIC BOTTLE ONLY CRITICAL RESULT CALLED TO, READ BACK BY AND VERIFIED WITH: PHRMD J ORIET @0544  02/13/19 BY S GEZAHEGN    Culture   Final    CULTURE REINCUBATED FOR BETTER GROWTH Performed at [facility], [place] [address], [place], [place] [address]    Report Status PENDING  Incomplete  Culture, Urine     Status: Abnormal (Preliminary result)   Collection Time: 02/13/19  1:16 AM  Result Value Ref Range Status   Specimen Description URINE, CLEAN CATCH  Final   Special Requests NONE  Final   Culture (A)   Final    40,000 COLONIES/mL GRAM NEGATIVE RODS IDENTIFICATION AND SUSCEPTIBILITIES TO FOLLOW Performed at [facility], [address], [place], [place] [address]    Report Status PENDING  Incomplete         Radiology Studies: No results found.      Scheduled Meds: . LORazepam  0.5 mg Intravenous Q12H  . pantoprazole  40 mg Oral Daily  . phenytoin (DILANTIN) IV  100 mg Intravenous Q8H  . topiramate  200 mg Oral BID   Continuous Infusions: . sodium chloride 50 mL/hr at 02/13/19 1341  . cefTRIAXone (ROCEPHIN)  IV 2 g (02/13/19 2015)  . clindamycin (CLEOCIN) IV 600 mg (02/14/19 0655)  . lacosamide (VIMPAT) IV 200 mg (02/13/19 2230)  . levETIRAcetam 1,500 mg (02/14/19 5009)     LOS: 10 days        Aline August, MD [facility] 02/14/2019, 8:39 AM

## 2019-02-15 LAB — URINE CULTURE: Culture: 40000 — AB

## 2019-02-15 LAB — GLUCOSE, CAPILLARY
Glucose-Capillary: 101 mg/dL — ABNORMAL HIGH (ref 70–99)
Glucose-Capillary: 100 mg/dL — ABNORMAL HIGH (ref 70–99)
Glucose-Capillary: 104 mg/dL — ABNORMAL HIGH (ref 70–99)
Glucose-Capillary: 119 mg/dL — ABNORMAL HIGH (ref 70–99)
Glucose-Capillary: 118 mg/dL — ABNORMAL HIGH (ref 70–99)

## 2019-02-15 LAB — CULTURE, BLOOD (ROUTINE X 2): Special Requests: ADEQUATE

## 2019-02-15 MED ORDER — LACOSAMIDE 200 MG PO TABS
200.0000 mg | ORAL_TABLET | Freq: Two times a day (BID) | ORAL | Status: DC
  Administered 2019-02-15 – 2019-02-22 (×15): 200 mg via ORAL
  Filled 2019-02-15 (×15): qty 1

## 2019-02-15 MED ORDER — CLINDAMYCIN HCL 300 MG PO CAPS
300.0000 mg | ORAL_CAPSULE | Freq: Three times a day (TID) | ORAL | Status: DC
  Administered 2019-02-15 – 2019-02-17 (×6): 300 mg via ORAL
  Filled 2019-02-15 (×7): qty 1

## 2019-02-15 MED ORDER — PHENYTOIN 50 MG PO CHEW
100.0000 mg | CHEWABLE_TABLET | Freq: Three times a day (TID) | ORAL | Status: DC
  Administered 2019-02-15 – 2019-02-22 (×21): 100 mg via ORAL
  Filled 2019-02-15 (×23): qty 2

## 2019-02-15 MED ORDER — SODIUM CHLORIDE 0.9% FLUSH
10.0000 mL | Freq: Two times a day (BID) | INTRAVENOUS | Status: DC
  Administered 2019-02-15 – 2019-02-16 (×3): 10 mL
  Administered 2019-02-16: 40 mL
  Administered 2019-02-17 – 2019-02-22 (×11): 10 mL

## 2019-02-15 MED ORDER — SODIUM CHLORIDE 0.9% FLUSH: 10.0000 mL | INTRAVENOUS | Status: DC | PRN

## 2019-02-15 MED ORDER — LEVETIRACETAM 750 MG PO TABS
1500.0000 mg | ORAL_TABLET | Freq: Two times a day (BID) | ORAL | Status: DC
  Administered 2019-02-15 – 2019-02-22 (×14): 1500 mg via ORAL
  Filled 2019-02-15 (×14): qty 2

## 2019-02-15 NOTE — Progress Notes (Signed)
Occupational Therapy Treatment Patient Details Name: Elonzo Sopp. MRN: [id number] DOB: [birth year] Today's Date: 02/15/2019    History of present illness 43 y/o male admitted secondary to seizure like activity at a bus stop. Per notes, pt with hx of TBI s/p craniotomy.    OT comments  Pt continue to make good progress toward goals, and goals remain appropriate. He was pleasant and eager to get up out of bed.  Pt requiring min guard/min assist with +2 for safety during functional mobility. Completed grooming tasks with min guard while standing at sink. Verbal cues for task completion but independent with sequencing tasks.   Follow Up Recommendations  SNF    Equipment Recommendations       Recommendations for Other Services      Precautions / Restrictions Precautions Precautions: Fall Precaution Comments: seizure Required Braces or Orthoses: Other Brace(helmet) Restrictions Weight Bearing Restrictions: No       Mobility Bed Mobility Overal bed mobility: Needs Assistance Bed Mobility: Supine to Sit     Supine to sit: Supervision     General bed mobility comments: for safety  Transfers Overall transfer level: Needs assistance Equipment used: 1 person hand held assist Transfers: Sit to/from Stand Sit to Stand: Min assist         General transfer comment: assist to steady    Balance Overall balance assessment: Needs assistance Sitting-balance support: Feet supported;No upper extremity supported Sitting balance-Leahy Scale: Good     Standing balance support: During functional activity;Single extremity supported;No upper extremity supported Standing balance-Leahy Scale: Poor                             ADL either performed or assessed with clinical judgement   ADL Overall ADL's : Needs assistance/impaired     Grooming: Wash/dry hands;Wash/dry face;Oral care;Min guard;Cueing for sequencing;Standing           Upper Body Dressing :  Minimal assistance;Sitting       Toilet Transfer: Min guard;Minimal assistance;Ambulation           Functional mobility during ADLs: Min guard;Minimal assistance;+2 for safety/equipment General ADL Comments: Pt performed grooming tasks while standing at sink.  Demonstrating ability to sequence but requires cues to continue/complete task.     Vision       Perception     Praxis      Cognition Arousal/Alertness: Awake/alert Behavior During Therapy: WFL for tasks assessed/performed Overall Cognitive Status: History of cognitive impairments - at baseline Area of Impairment: Orientation;Memory;Following commands;Safety/judgement;Problem solving;Awareness                 Orientation Level: Place;Time;Situation Current Attention Level: Focused Memory: Decreased short-term memory Following Commands: Follows one step commands consistently;Follows one step commands with increased time Safety/Judgement: Decreased awareness of safety;Decreased awareness of deficits Awareness: Intellectual Problem Solving: Difficulty sequencing;Requires verbal cues;Requires tactile cues;Slow processing;Decreased initiation General Comments: pt with hx of TBI; pt initiates tasks but needs cues to sustain task        Exercises     Shoulder Instructions       General Comments      Pertinent Vitals/ Pain       Pain Assessment: Faces Faces Pain Scale: No hurt  Home Living  Prior Functioning/Environment              Frequency  Min 3X/week        Progress Toward Goals  OT Goals(current goals can now be found in the care plan section)  Progress towards OT goals: Progressing toward goals(goals continue to be appropriate)  Acute Rehab OT Goals Patient Stated Goal: to eat lunch OT Goal Formulation: Patient unable to participate in goal setting Time For Goal Achievement: 03/01/19 ADL Goals Pt Will Perform Eating: with  modified independence Pt Will Perform Grooming: with modified independence Pt Will Perform Upper Body Bathing: with supervision Pt Will Perform Lower Body Bathing: with supervision Pt Will Perform Upper Body Dressing: with supervision Pt Will Perform Lower Body Dressing: with min assist Pt Will Transfer to Toilet: with supervision  Plan Discharge plan remains appropriate    Co-evaluation    PT/OT/SLP Co-Evaluation/Treatment: Yes Reason for Co-Treatment: Necessary to address cognition/behavior during functional activity;For patient/therapist safety PT goals addressed during session: Mobility/safety with mobility;Balance OT goals addressed during session: ADL's and self-care      AM-PAC OT "6 Clicks" Daily Activity     Outcome Measure   Help from another person eating meals?: A Little Help from another person taking care of personal grooming?: A Little Help from another person toileting, which includes using toliet, bedpan, or urinal?: A Little Help from another person bathing (including washing, rinsing, drying)?: A Little Help from another person to put on and taking off regular upper body clothing?: A Little Help from another person to put on and taking off regular lower body clothing?: A Little 6 Click Score: 18    End of Session Equipment Utilized During Treatment: Gait belt(helmet)  OT Visit Diagnosis: Unsteadiness on feet (R26.81);Muscle weakness (generalized) (M62.81);Apraxia (R48.2);Other symptoms and signs involving the nervous system (R29.898);Other symptoms and signs involving cognitive function;Hemiplegia and hemiparesis   Activity Tolerance Patient tolerated treatment well   Patient Left in bed;with call bell/phone within reach;with nursing/sitter in room(sitting EOB to eat breakfast with sitter present in room)   Nurse Communication Mobility status;Precautions        Time: 0093-8182 OT Time Calculation (min): 33 min  Charges: OT General Charges $OT Visit:  1 Visit OT Treatments $Self Care/Home Management : 8-22 mins     Darrol Jump OTR/L [place] [phone] 02/15/2019, 12:57 PM

## 2019-02-15 NOTE — Progress Notes (Addendum)
Patient ID: Travis Briggs., male   DOB: 28-Mar-1976, 43 y.o.   MRN: [id number]  PROGRESS NOTE    Travis Briggs Travis Briggs.  MEQ:[id number] DOB: Dec 12, 1975 DOA: 02/03/2019 PCP: System, Pcp Not In   Brief Narrative:  43 year old male with history of traumatic brain injury status post craniotomy/cranioplasty presented with seizures.  He was found to have status epilepticus.  Neurology was consulted.  He was started on antiepileptics.  Assessment & Plan:   Principal Problem:   Status epilepticus ([facility]) Active Problems:   Dental cyst   Stroke (cerebrum) (HCC)   Thrombocytopenia ([facility])  Status epilepticus in a patient with history of traumatic brain injury status post craniotomy/cranioplasty: Improving Todd's paresis: Improving -Improving.  Neurology evaluation and follow-up appreciated.  Neurology has signed off.  Neurology recommends to continue current four antiepileptic agents with a goal to eventually taper to 2 agents over the next few weeks to prevent rebound seizures.  This can be done as an outpatient with close follow-up with outpatient neurologist.  Neurology recommends to first taper off Vimpat and then Topamax, with final regimen of Keppra and Dilantin.  Neurology discontinued Ativan on 02/13/2019.   -Use ativan only for agitation  -Continue seizure precautions. -02/09/19: EEG showed no seizures   Acute encephalopathy -Probably secondary to above. -Patient has poor memory at baseline along with word finding difficulty/aphasia.  Patient is supposed to wear cranial helmet.  Dental cyst -CT showing cystic lesion involving the right mandible which is associated with right lower premolar dental root.  This is thought to be most likely a dental cyst which could be due to radicular cyst or chronic infection. -We will need outpatient dentistry evaluation and follow-up -Currently on clindamycin. Will switch to oral -afebrile currently  Leukocytosis -Resolved  Gram-positive rod  bacteremia: 1 out of 4 bottles positive -Probably contaminant.  Probable UTI -Urine culture is growing Pseudomonas.  Currently on Rocephin and not spiking temperatures.  Pseudomonas might be colonizer.  Will DC Rocephin and monitor.   DVT prophylaxis: SCDs Code Status: Full Family Communication: None at bedside Disposition Plan: SNF once bed is available  Consultants: Neurology  Procedures: EEG  Antimicrobials:  Anti-infectives (From admission, onward)   Start     Dose/Rate Route Frequency Ordered Stop   02/12/19 2030  cefTRIAXone (ROCEPHIN) 2 g in sodium chloride 0.9 % 100 mL IVPB     2 g 200 mL/hr over 30 Minutes Intravenous Every 24 hours 02/12/19 1947     02/11/19 2200  clindamycin (CLEOCIN) IVPB 600 mg     600 mg 100 mL/hr over 30 Minutes Intravenous Every 8 hours 02/11/19 2051         Subjective: Patient seen and examined at bedside.  He is awake but a poor historian and answers only a few questions.  No overnight seizures, no fever reported.  Objective: Vitals:   02/14/19 2006 02/14/19 2340 02/15/19 0100 02/15/19 0317  BP: 113/85 116/83  124/88  Pulse: 65 68  75  Resp: 18 18  18   Temp: 98 F (36.7 C) 98.2 F (36.8 C)  97.6 F (36.4 C)  TempSrc:  Oral  Oral  SpO2: 100% 100%  100%  Weight:   73.2 kg   Height:        Intake/Output Summary (Last 24 hours) at 02/15/2019 0715 Last data filed at 02/15/2019 0645 Gross per 24 hour  Intake 600 ml  Output 1350 ml  Net -750 ml   Filed Weights   02/03/19 1642 02/09/19  0305 02/15/19 0100  Weight: 72.6 kg 73.1 kg 73.2 kg    Examination:  General exam: Appears calm and comfortable.  Looks older than stated age.  Awake but answers only a few questions.  No distress Respiratory system: Bilateral decreased breath sounds at bases Cardiovascular system: Rate controlled, S1 & S2 heard Gastrointestinal system: Abdomen is nondistended, soft and nontender. Normal bowel sounds heard. Extremities: No cyanosis, edema      Data Reviewed: I have personally reviewed following labs and imaging studies  CBC: Recent Labs  Lab 02/11/19 0441 02/11/19 1213 02/12/19 0535 02/13/19 0354 02/14/19 0609  WBC 16.1* 19.1* 17.4* 13.0* 7.7  NEUTROABS  --   --   --  10.4* 5.1  HGB 13.7 12.9* 12.3* 12.0* 11.5*  HCT 41.5 38.8* 36.5* 36.2* 35.2*  MCV 91.4 91.9 92.4 92.6 93.4  PLT 213 202 196 219 449   Basic Metabolic Panel: Recent Labs  Lab 02/10/19 0444 02/11/19 0441 02/12/19 0535 02/13/19 0354 02/14/19 0609  NA 139 137 138 138 138  K 4.0 4.0 3.8 4.1 3.9  CL 110 109 112* 107 107  CO2 21* 18* 18* 16* 19*  GLUCOSE 108* 115* 121* 120* 118*  BUN 13 10 8 7 6   CREATININE 1.01 1.08 0.95 0.82 0.86  CALCIUM 9.2 9.2 8.7* 8.9 8.9  MG 2.1 2.0 2.0 2.1 2.1  PHOS  --   --   --  2.8  --    GFR: Estimated Creatinine Clearance: 109.6 mL/min (by C-G formula based on SCr of 0.86 mg/dL). Liver Function Tests: Recent Labs  Lab 02/12/19 0535 02/13/19 0354  ALBUMIN 3.2* 3.3*   No results for input(s): LIPASE, AMYLASE in the last 168 hours. No results for input(s): AMMONIA in the last 168 hours. Coagulation Profile: No results for input(s): INR, PROTIME in the last 168 hours. Cardiac Enzymes: No results for input(s): CKTOTAL, CKMB, CKMBINDEX, TROPONINI in the last 168 hours. BNP (last 3 results) No results for input(s): PROBNP in the last 8760 hours. HbA1C: No results for input(s): HGBA1C in the last 72 hours. CBG: Recent Labs  Lab 02/14/19 0432 02/14/19 0739 02/14/19 1653 02/14/19 2002 02/14/19 2339  GLUCAP 117* 98 96 102* 124*   Lipid Profile: No results for input(s): CHOL, HDL, LDLCALC, TRIG, CHOLHDL, LDLDIRECT in the last 72 hours. Thyroid Function Tests: No results for input(s): TSH, T4TOTAL, FREET4, T3FREE, THYROIDAB in the last 72 hours. Anemia Panel: No results for input(s): VITAMINB12, FOLATE, FERRITIN, TIBC, IRON, RETICCTPCT in the last 72 hours. Sepsis Labs: Recent Labs  Lab 02/11/19 0857  02/11/19 1213  LATICACIDVEN 0.8 1.1    Recent Results (from the past 240 hour(s))  Culture, blood (routine x 2)     Status: None (Preliminary result)   Collection Time: 02/11/19 11:23 PM  Result Value Ref Range Status   Specimen Description BLOOD RIGHT ANTECUBITAL  Final   Special Requests   Final    BOTTLES DRAWN AEROBIC ONLY Blood Culture adequate volume   Culture   Final    NO GROWTH 3 DAYS Performed at [facility], [address], [place], [place] [address]    Report Status PENDING  Incomplete  Culture, blood (routine x 2)     Status: None (Preliminary result)   Collection Time: 02/11/19 11:29 PM  Result Value Ref Range Status   Specimen Description BLOOD RIGHT ANTECUBITAL  Final   Special Requests   Final    BOTTLES DRAWN AEROBIC ONLY Blood Culture adequate volume  Culture  Setup Time   Final    GRAM POSITIVE RODS AEROBIC BOTTLE ONLY CRITICAL RESULT CALLED TO, READ BACK BY AND VERIFIED WITH: PHRMD J ORIET @0544  02/13/19 BY S GEZAHEGN    Culture   Final    GRAM POSITIVE RODS CULTURE REINCUBATED FOR BETTER GROWTH Performed at [facility], [place] [address] [place], [place] [address]    Report Status PENDING  Incomplete  Culture, Urine     Status: Abnormal (Preliminary result)   Collection Time: 02/13/19  1:16 AM  Result Value Ref Range Status   Specimen Description URINE, CLEAN CATCH  Final   Special Requests NONE  Final   Culture (A)  Final    40,000 COLONIES/mL GRAM NEGATIVE RODS IDENTIFICATION AND SUSCEPTIBILITIES TO FOLLOW Performed at [facility], [address] [place], [place] [address]    Report Status PENDING  Incomplete         Radiology Studies: No results found.      Scheduled Meds: . pantoprazole  40 mg Oral Daily  . phenytoin (DILANTIN) IV  100 mg Intravenous Q8H  . sodium chloride flush  10-40 mL Intracatheter Q12H  . topiramate  200 mg Oral BID   Continuous Infusions: . cefTRIAXone (ROCEPHIN)  IV 2 g (02/14/19  2003)  . clindamycin (CLEOCIN) IV 600 mg (02/15/19 3716)  . lacosamide (VIMPAT) IV 200 mg (02/14/19 2221)  . levETIRAcetam 1,500 mg (02/15/19 0525)     LOS: 11 days        Aline August, MD [facility] 02/15/2019, 7:15 AM

## 2019-02-15 NOTE — Progress Notes (Signed)
  Speech Language Pathology Treatment: Cognitive-Linquistic(Aphasia )  Patient Details Name: Travis Briggs. MRN: [id number] DOB: August 28, 1976 Today's Date: 02/15/2019 Time: 9244-6286 SLP Time Calculation (min) (ACUTE ONLY): 23 min  Assessment / Plan / Recommendation Clinical Impression  Pt was approached for treatment and demonstrated notably increased difficulty with attention compared to that which was noted when he was last seen by this SLP on 02/08/19. Pt now has a 1:1 sitter and she reported that the pt has been demonstrating this behavior throughout the day but he was able to attend to some functional tasks this morning. He was unable to participate in very structured language treatment despite cueing. He demonstrated perseveration of phrases and sentences and was not easily re-directed. He continues to demonstrate difficulty with word retrieval but was inconsistently responsive to phonemic cues during conversation. He demonstrated perseveration on "yes" when responding to all yes/no questions. SLP will continue to follow pt.    HPI HPI: Pt is a 43 y.o. 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 EMS was called after bystanders observed patient having a seizure at a local bus stop. When EMS arrived they noted him having tonic-clonic seizure activity. Patient was given 5 mg of Versed IM. In ED he was noted to have right hemiparesis, right gaze preference, and a right dense aphasia with inability to speak.  MRI of the brain revealed bifrontal, left temporal and parietal encephalomalacia with no acute intracranial abnormality identified. EEG was performed and showed evidence of cerebral dysfunction in theleft frontal region with epileptogenicity.Frequent seizures were recorded from the left frontal region lasting on average 1-2 minutes with no apparent clinical signs  on video.       SLP Plan  Continue with current plan of care       Recommendations                   Oral Care Recommendations: Oral care BID Follow up Recommendations: Skilled Nursing facility SLP Visit Diagnosis: Aphasia (R47.01) Plan: Continue with current plan of care       Rondo Spittler I. Hardin Negus, [place], [place] Office number [phone] Pager [place] 02/15/2019, 4:38 PM

## 2019-02-15 NOTE — Progress Notes (Signed)
Physical Therapy Treatment Patient Details Name: Travis Briggs. MRN: [id number] DOB: 1976/04/27 Today's Date: 02/15/2019    History of Present Illness 43 y/o male admitted secondary to seizure like activity at a bus stop. Per notes, pt with hx of TBI s/p craniotomy.     PT Comments    Patient seen for mobility progression. Pt is pleasant and participatory today. Pt requires min guard/min A (+2 for safety) for gait training. Pt continues to demonstrate cognitive deficits and impaired balance increasing risk for falls. Continue to progress as tolerated.    Follow Up Recommendations  SNF     Equipment Recommendations  Other (comment)(TBD next venue)    Recommendations for Other Services       Precautions / Restrictions Precautions Precautions: Fall Precaution Comments: seizure Required Braces or Orthoses: Other Brace(helmet) Restrictions Weight Bearing Restrictions: No    Mobility  Bed Mobility Overal bed mobility: Needs Assistance Bed Mobility: Supine to Sit     Supine to sit: Supervision     General bed mobility comments: for safety  Transfers Overall transfer level: Needs assistance Equipment used: 1 person hand held assist Transfers: Sit to/from Stand Sit to Stand: Min assist         General transfer comment: assist to steady  Ambulation/Gait Ambulation/Gait assistance: Min assist;+2 safety/equipment;Min guard Gait Distance (Feet): 240 Feet Assistive device: (and assist at trunk with gait belt) Gait Pattern/deviations: Step-through pattern;Decreased stride length Gait velocity: Decreased    General Gait Details: grossly min guard for safety especially with increased cadence; pt is unsteady but without LOB; pt is able to change gait speed with cues    Stairs             Wheelchair Mobility    Modified Rankin (Stroke Patients Only)       Balance Overall balance assessment: Needs assistance Sitting-balance support: Feet  supported;No upper extremity supported Sitting balance-Leahy Scale: Good     Standing balance support: During functional activity;Single extremity supported;No upper extremity supported Standing balance-Leahy Scale: Poor                              Cognition Arousal/Alertness: Awake/alert Behavior During Therapy: WFL for tasks assessed/performed Overall Cognitive Status: History of cognitive impairments - at baseline(not sure if this is pt's baseline) Area of Impairment: Orientation;Memory;Following commands;Safety/judgement;Problem solving;Awareness                 Orientation Level: Place;Time;Situation(appears pt attempts to state we are at a facility)   Memory: Decreased short-term memory Following Commands: Follows one step commands consistently;Follows one step commands with increased time Safety/Judgement: Decreased awareness of safety;Decreased awareness of deficits Awareness: Intellectual Problem Solving: Difficulty sequencing;Requires verbal cues;Requires tactile cues;Slow processing;Decreased initiation General Comments: pt with hx of TBI; pt initiates tasks but needs cues to sustain task      Exercises      General Comments        Pertinent Vitals/Pain Pain Assessment: Faces Faces Pain Scale: No hurt    Home Living                      Prior Function            PT Goals (current goals can now be found in the care plan section) Progress towards PT goals: Progressing toward goals    Frequency    Min 3X/week      PT Plan Current plan  remains appropriate    Co-evaluation PT/OT/SLP Co-Evaluation/Treatment: Yes Reason for Co-Treatment: Necessary to address cognition/behavior during functional activity;For patient/therapist safety;To address functional/ADL transfers PT goals addressed during session: Mobility/safety with mobility;Balance        AM-PAC PT "6 Clicks" Mobility   Outcome Measure  Help needed turning from  your back to your side while in a flat bed without using bedrails?: None Help needed moving from lying on your back to sitting on the side of a flat bed without using bedrails?: None Help needed moving to and from a bed to a chair (including a wheelchair)?: A Little Help needed standing up from a chair using your arms (e.g., wheelchair or bedside chair)?: A Little Help needed to walk in hospital room?: A Little Help needed climbing 3-5 steps with a railing? : A Lot 6 Click Score: 19    End of Session Equipment Utilized During Treatment: Gait belt Activity Tolerance: Patient tolerated treatment well Patient left: with call bell/phone within reach;with nursing/sitter in room;Other (comment)(pt sitting EOB with sitter present) Nurse Communication: Mobility status PT Visit Diagnosis: Unsteadiness on feet (R26.81);Other abnormalities of gait and mobility (R26.89);Muscle weakness (generalized) (M62.81);History of falling (Z91.81);Difficulty in walking, not elsewhere classified (R26.2)     Time: 4765-4650 PT Time Calculation (min) (ACUTE ONLY): 32 min  Charges:  $Gait Training: 8-22 mins                     Earney Navy, PTA Acute Rehabilitation Services Pager: [phone] Office: [phone]     Darliss Cheney 02/15/2019, 10:11 AM

## 2019-02-16 ENCOUNTER — Encounter (HOSPITAL_COMMUNITY): Payer: Self-pay | Admitting: *Deleted

## 2019-02-16 LAB — GLUCOSE, CAPILLARY
Glucose-Capillary: 105 mg/dL — ABNORMAL HIGH (ref 70–99)
Glucose-Capillary: 109 mg/dL — ABNORMAL HIGH (ref 70–99)
Glucose-Capillary: 113 mg/dL — ABNORMAL HIGH (ref 70–99)
Glucose-Capillary: 124 mg/dL — ABNORMAL HIGH (ref 70–99)
Glucose-Capillary: 156 mg/dL — ABNORMAL HIGH (ref 70–99)
Glucose-Capillary: 95 mg/dL (ref 70–99)
Glucose-Capillary: 98 mg/dL (ref 70–99)

## 2019-02-16 NOTE — TOC Initial Note (Signed)
Transition of Care (TOC) - Initial/Assessment Note    Patient Details  Name: Travis Briggs. MRN: [id number] Date of Birth: 01-05-1976  Transition of Care [facility]) CM/SW Contact:    Gelene Mink, [place] Phone Number: 02/16/2019, 11:39 AM  Clinical Narrative:                  CSW is continuing to follow for SNF disposition. Currently the patient does not have any active bed offers.    Expected Discharge Plan: Assisted Living Barriers to Discharge: Continued Medical Work up, Other (comment)(pt initially doesnt know identity--old TBI)   Patient Goals and CMS Choice        Expected Discharge Plan and Services Expected Discharge Plan: Assisted Living       Living arrangements for the past 2 months: [place]                                      Prior Living Arrangements/Services Living arrangements for the past 2 months: [place]   Patient language and need for interpreter reviewed:: Yes(no needs)              Criminal Activity/Legal Involvement Pertinent to Current Situation/Hospitalization: No - Comment as needed  Activities of Daily Living Home Assistive Devices/Equipment: None    Permission Sought/Granted                  Emotional Assessment           Psych Involvement: No (comment)  Admission diagnosis:  Aphasia [R47.01] Seizure ([place]) [R56.9] Trauma [T14.90XA] Right sided weakness [R53.1] Patient Active Problem List   Diagnosis Date Noted  . Thrombocytopenia ([place]) 02/08/2019  . Status epilepticus ([place]) 02/04/2019  . Dental cyst 02/04/2019  . 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

## 2019-02-16 NOTE — Progress Notes (Signed)
Patient ID: Travis Briggs., male   DOB: [birth year], 43 y.o.   MRN: [id number]  PROGRESS NOTE    Travis Briggs.  ZYS:[id number] DOB: 11-13-1975 DOA: 02/03/2019 PCP: System, Pcp Not In   Brief Narrative:  43 year old male with history of traumatic brain injury status post craniotomy/cranioplasty presented with seizures.  He was found to have status epilepticus.  Neurology was consulted.  He was started on antiepileptics.  Assessment & Plan:   Principal Problem:   Status epilepticus ([place]) Active Problems:   Dental cyst   Stroke (cerebrum) (H[facility])   Thrombocytopenia ([facility])  Status epilepticus in a patient with history of traumatic brain injury status post craniotomy/cranioplasty: Improving Todd's paresis: Improving -Improving.  Neurology evaluation and follow-up appreciated.  Neurology has signed off.  Currently on oral Vimpat, Topamax, Keppra and Dilantin.  Neurology recommends to continue current four antiepileptic agents with a goal to eventually taper to 2 agents over the next few weeks to prevent rebound seizures.  This can be done as an outpatient with close follow-up with outpatient neurologist.  Neurology recommends to first taper off Vimpat and then Topamax, with final regimen of Keppra and Dilantin.  Neurology discontinued Ativan on 02/13/2019.   -Use ativan only for agitation  -Continue seizure precautions. -02/09/19: EEG showed no seizures   Acute encephalopathy -Probably secondary to above. -Patient has poor memory at baseline along with word finding difficulty/aphasia.  Patient is supposed to wear cranial helmet.  Dental cyst -CT showing cystic lesion involving the right mandible which is associated with right lower premolar dental root.  This is thought to be most likely a dental cyst which could be due to radicular cyst or chronic infection. -We will need outpatient dentistry evaluation and follow-up -Clindamycin and has been switched to oral.  Finish 7-day  course of therapy. -afebrile currently  Leukocytosis -Resolved  Gram-positive rod bacteremia: 1 out of 4 bottles positive -Probably contaminant.  Probable UTI -Urine culture grew Pseudomonas.  Patient was on Rocephin and not spiking temperatures.  Pseudomonas might be colonizer.  Rocephin discontinued on 02/15/2019.  No temperature spikes since then.   DVT prophylaxis: SCDs Code Status: Full Family Communication: None at bedside Disposition Plan: SNF once bed is available  Consultants: Neurology  Procedures: EEG  Antimicrobials:  Anti-infectives (From admission, onward)   Start     Dose/Rate Route Frequency Ordered Stop   02/15/19 1400  clindamycin (CLEOCIN) capsule 300 mg     300 mg Oral Every 8 hours 02/15/19 0856     02/12/19 2030  cefTRIAXone (ROCEPHIN) 2 g in sodium chloride 0.9 % 100 mL IVPB  Status:  Discontinued     2 g 200 mL/hr over 30 Minutes Intravenous Every 24 hours 02/12/19 1947 02/15/19 0859   02/11/19 2200  clindamycin (CLEOCIN) IVPB 600 mg  Status:  Discontinued     600 mg 100 mL/hr over 30 Minutes Intravenous Every 8 hours 02/11/19 2051 02/15/19 0856        Subjective: Patient seen and examined at bedside.  No overnight seizures or fever reported.  He is awake but a poor historian and answers only a few questions.   Objective: Vitals:   02/15/19 2029 02/16/19 0013 02/16/19 0345 02/16/19 0810  BP: 110/64 112/68 115/80 116/90  Pulse: 66 64 70 62  Resp: 16 16 18 20   Temp: 97.6 F (36.4 C) 97.8 F (36.6 C) 98.6 F (37 C) 98.3 F (36.8 C)  TempSrc: Oral Oral Oral Oral  SpO2: 100%  98%   Weight:      Height:        Intake/Output Summary (Last 24 hours) at 02/16/2019 1025 Last data filed at 02/15/2019 2200 Gross per 24 hour  Intake 120 ml  Output -  Net 120 ml   Filed Weights   02/03/19 1642 02/09/19 0305 02/15/19 0100  Weight: 72.6 kg 73.1 kg 73.2 kg    Examination:  General exam: Appears calm and comfortable.  Looks older than stated  age.  Awake but answers only a few questions.  No acute distress.  No seizure-like activities currently. Respiratory system: Bilateral decreased breath sounds at bases, no wheezing Cardiovascular system:  S1 & S2 heard, rate controlled Gastrointestinal system: Abdomen is nondistended, soft and nontender. Normal bowel sounds heard. Extremities: No cyanosis, edema     Data Reviewed: I have personally reviewed following labs and imaging studies  CBC: Recent Labs  Lab 02/11/19 0441 02/11/19 1213 02/12/19 0535 02/13/19 0354 02/14/19 0609  WBC 16.1* 19.1* 17.4* 13.0* 7.7  NEUTROABS  --   --   --  10.4* 5.1  HGB 13.7 12.9* 12.3* 12.0* 11.5*  HCT 41.5 38.8* 36.5* 36.2* 35.2*  MCV 91.4 91.9 92.4 92.6 93.4  PLT 213 202 196 219 503   Basic Metabolic Panel: Recent Labs  Lab 02/10/19 0444 02/11/19 0441 02/12/19 0535 02/13/19 0354 02/14/19 0609  NA 139 137 138 138 138  K 4.0 4.0 3.8 4.1 3.9  CL 110 109 112* 107 107  CO2 21* 18* 18* 16* 19*  GLUCOSE 108* 115* 121* 120* 118*  BUN 13 10 8 7 6   CREATININE 1.01 1.08 0.95 0.82 0.86  CALCIUM 9.2 9.2 8.7* 8.9 8.9  MG 2.1 2.0 2.0 2.1 2.1  PHOS  --   --   --  2.8  --    GFR: Estimated Creatinine Clearance: 109.6 mL/min (by C-G formula based on SCr of 0.86 mg/dL). Liver Function Tests: Recent Labs  Lab 02/12/19 0535 02/13/19 0354  ALBUMIN 3.2* 3.3*   No results for input(s): LIPASE, AMYLASE in the last 168 hours. No results for input(s): AMMONIA in the last 168 hours. Coagulation Profile: No results for input(s): INR, PROTIME in the last 168 hours. Cardiac Enzymes: No results for input(s): CKTOTAL, CKMB, CKMBINDEX, TROPONINI in the last 168 hours. BNP (last 3 results) No results for input(s): PROBNP in the last 8760 hours. HbA1C: No results for input(s): HGBA1C in the last 72 hours. CBG: Recent Labs  Lab 02/15/19 1657 02/15/19 2025 02/16/19 0028 02/16/19 0347 02/16/19 0730  GLUCAP 119* 118* 156* 124* 109*   Lipid  Profile: No results for input(s): CHOL, HDL, LDLCALC, TRIG, CHOLHDL, LDLDIRECT in the last 72 hours. Thyroid Function Tests: No results for input(s): TSH, T4TOTAL, FREET4, T3FREE, THYROIDAB in the last 72 hours. Anemia Panel: No results for input(s): VITAMINB12, FOLATE, FERRITIN, TIBC, IRON, RETICCTPCT in the last 72 hours. Sepsis Labs: Recent Labs  Lab 02/11/19 0857 02/11/19 1213  LATICACIDVEN 0.8 1.1    Recent Results (from the past 240 hour(s))  Culture, blood (routine x 2)     Status: None (Preliminary result)   Collection Time: 02/11/19 11:23 PM  Result Value Ref Range Status   Specimen Description BLOOD RIGHT ANTECUBITAL  Final   Special Requests   Final    BOTTLES DRAWN AEROBIC ONLY Blood Culture adequate volume   Culture   Final    NO GROWTH 3 DAYS Performed at [facility], [address], [place],  [address]  Report Status PENDING  Incomplete  Culture, blood (routine x 2)     Status: Abnormal   Collection Time: 02/11/19 11:29 PM  Result Value Ref Range Status   Specimen Description BLOOD RIGHT ANTECUBITAL  Final   Special Requests   Final    BOTTLES DRAWN AEROBIC ONLY Blood Culture adequate volume   Culture  Setup Time   Final    GRAM POSITIVE RODS AEROBIC BOTTLE ONLY CRITICAL RESULT CALLED TO, READ BACK BY AND VERIFIED WITH: PHRMD J ORIET @0544  02/13/19 BY S GEZAHEGN    Culture (A)  Final    DIPHTHEROIDS(CORYNEBACTERIUM SPECIES) Standardized susceptibility testing for this organism is not available. Performed at [facility], [place] [address], [place], [place] [address]    Report Status 02/15/2019 FINAL  Final  Culture, Urine     Status: Abnormal   Collection Time: 02/13/19  1:16 AM  Result Value Ref Range Status   Specimen Description URINE, CLEAN CATCH  Final   Special Requests   Final    NONE Performed at [facility], [place] [address] [place], [place] [address]    Culture 40,000 COLONIES/mL PSEUDOMONAS AERUGINOSA (A)  Final    Report Status 02/15/2019 FINAL  Final   Organism ID, Bacteria PSEUDOMONAS AERUGINOSA (A)  Final      Susceptibility   Pseudomonas aeruginosa - MIC*    CEFTAZIDIME 4 SENSITIVE Sensitive     CIPROFLOXACIN <=0.25 SENSITIVE Sensitive     GENTAMICIN 2 SENSITIVE Sensitive     IMIPENEM 1 SENSITIVE Sensitive     PIP/TAZO 8 SENSITIVE Sensitive     CEFEPIME 2 SENSITIVE Sensitive     * 40,000 COLONIES/mL PSEUDOMONAS AERUGINOSA         Radiology Studies: No results found.      Scheduled Meds: . clindamycin  300 mg Oral Q8H  . lacosamide  200 mg Oral BID  . levETIRAcetam  1,500 mg Oral BID  . pantoprazole  40 mg Oral Daily  . phenytoin  100 mg Oral TID  . sodium chloride flush  10-40 mL Intracatheter Q12H  . topiramate  200 mg Oral BID   Continuous Infusions:    LOS: 12 days        Aline August, MD [facility] 02/16/2019, 10:25 AM

## 2019-02-17 LAB — CULTURE, BLOOD (ROUTINE X 2)
Culture: NO GROWTH
Special Requests: ADEQUATE

## 2019-02-17 LAB — GLUCOSE, CAPILLARY
Glucose-Capillary: 129 mg/dL — ABNORMAL HIGH (ref 70–99)
Glucose-Capillary: 107 mg/dL — ABNORMAL HIGH (ref 70–99)
Glucose-Capillary: 106 mg/dL — ABNORMAL HIGH (ref 70–99)
Glucose-Capillary: 101 mg/dL — ABNORMAL HIGH (ref 70–99)
Glucose-Capillary: 110 mg/dL — ABNORMAL HIGH (ref 70–99)

## 2019-02-17 LAB — SARS CORONAVIRUS 2 BY RT PCR (HOSPITAL ORDER, PERFORMED IN ~~LOC~~ HOSPITAL LAB): SARS Coronavirus 2: NEGATIVE

## 2019-02-17 NOTE — Progress Notes (Signed)
Patient ID: Travis Briggs., male   DOB: [birth year], 43 y.o.   MRN: [id number]  PROGRESS NOTE    [name]  DQQ:[id number] DOB: Jun 01, 1976 DOA: 02/03/2019 PCP: System, Pcp Not In   Brief Narrative:  43 year old male with history of traumatic brain injury status post craniotomy/cranioplasty presented with seizures.  He was found to have status epilepticus.  Neurology was consulted.  He was started on antiepileptics.  Assessment & Plan:   Principal Problem:   Status epilepticus ([place]) Active Problems:   Dental cyst   Stroke (cerebrum) (HCC)   Thrombocytopenia (HCC)  Status epilepticus in a patient with history of traumatic brain injury status post craniotomy/cranioplasty: Improving Todd's paresis: Improving -Improving.  Neurology evaluation and follow-up appreciated.  Neurology has signed off.  Currently on oral Vimpat, Topamax, Keppra and Dilantin.  Neurology recommends to continue current four antiepileptic agents with a goal to eventually taper to 2 agents over the next few weeks to prevent rebound seizures.  This can be done as an outpatient with close follow-up with outpatient neurologist.  Neurology recommends to first taper off Vimpat and then Topamax, with final regimen of Keppra and Dilantin.  Neurology discontinued Ativan on 02/13/2019.   -Use ativan only for agitation  -Continue seizure precautions. -02/09/19: EEG showed no seizures -No current seizures   Acute encephalopathy -Probably secondary to above. -Patient has poor memory at baseline along with word finding difficulty/aphasia.  Patient is supposed to wear cranial helmet.  Dental cyst -CT showing cystic lesion involving the right mandible which is associated with right lower premolar dental root.  This is thought to be most likely a dental cyst which could be due to radicular cyst or chronic infection. -We will need outpatient dentistry evaluation and follow-up -Clindamycin and has been switched to  oral.  DC clindamycin today. -afebrile currently  Leukocytosis -Resolved  Gram-positive rod bacteremia: 1 out of 4 bottles positive -Probably contaminant.  Probable UTI -Urine culture grew Pseudomonas.  Patient was on Rocephin and not spiking temperatures.  Pseudomonas might be colonizer.  Rocephin discontinued on 02/15/2019.  No temperature spikes since then.   DVT prophylaxis: SCDs.  Start Lovenox Code Status: Full Family Communication: None at bedside Disposition Plan: SNF once bed is available.  Patient is medically stable for discharge  Consultants: Neurology  Procedures: EEG  Antimicrobials:  Anti-infectives (From admission, onward)   Start     Dose/Rate Route Frequency Ordered Stop   02/15/19 1400  clindamycin (CLEOCIN) capsule 300 mg     300 mg Oral Every 8 hours 02/15/19 0856     02/12/19 2030  cefTRIAXone (ROCEPHIN) 2 g in sodium chloride 0.9 % 100 mL IVPB  Status:  Discontinued     2 g 200 mL/hr over 30 Minutes Intravenous Every 24 hours 02/12/19 1947 02/15/19 0859   02/11/19 2200  clindamycin (CLEOCIN) IVPB 600 mg  Status:  Discontinued     600 mg 100 mL/hr over 30 Minutes Intravenous Every 8 hours 02/11/19 2051 02/15/19 0856         Subjective: Patient seen and examined at bedside. He is awake but a poor historian and does not answer questions appropriately..  No overnight fever or seizures reported.  Objective: Vitals:   02/16/19 1955 02/16/19 2352 02/17/19 0343 02/17/19 0400  BP: 122/78 111/84 100/72   Pulse: (!) 56 66 68   Resp: 18 18 16    Temp: 98.4 F (36.9 C) 98.5 F (36.9 C) (!) 97.4 F (36.3 C) 97.6 F (  36.4 C)  TempSrc: Oral Oral Oral Oral  SpO2: 100% 99% 99%   Weight:      Height:        Intake/Output Summary (Last 24 hours) at 02/17/2019 0747 Last data filed at 02/17/2019 0612 Gross per 24 hour  Intake 480 ml  Output 550 ml  Net -70 ml   Filed Weights   02/03/19 1642 02/09/19 0305 02/15/19 0100  Weight: 72.6 kg 73.1 kg 73.2 kg     Examination:  General exam: Appears calm and comfortable.  Looks older than stated age.  Awake but does not answer questions appropriately.  No distress.  No seizure-like activities currently. Respiratory system: Bilateral decreased breath sounds at bases Cardiovascular system: Rate controlled, S1-S2 heard Gastrointestinal system: Abdomen is nondistended, soft and nontender. Normal bowel sounds heard. Extremities: No cyanosis, edema     Data Reviewed: I have personally reviewed following labs and imaging studies  CBC: Recent Labs  Lab 02/11/19 0441 02/11/19 1213 02/12/19 0535 02/13/19 0354 02/14/19 0609  WBC 16.1* 19.1* 17.4* 13.0* 7.7  NEUTROABS  --   --   --  10.4* 5.1  HGB 13.7 12.9* 12.3* 12.0* 11.5*  HCT 41.5 38.8* 36.5* 36.2* 35.2*  MCV 91.4 91.9 92.4 92.6 93.4  PLT 213 202 196 219 092   Basic Metabolic Panel: Recent Labs  Lab 02/11/19 0441 02/12/19 0535 02/13/19 0354 02/14/19 0609  NA 137 138 138 138  K 4.0 3.8 4.1 3.9  CL 109 112* 107 107  CO2 18* 18* 16* 19*  GLUCOSE 115* 121* 120* 118*  BUN 10 8 7 6   CREATININE 1.08 0.95 0.82 0.86  CALCIUM 9.2 8.7* 8.9 8.9  MG 2.0 2.0 2.1 2.1  PHOS  --   --  2.8  --    GFR: Estimated Creatinine Clearance: 109.6 mL/min (by C-G formula based on SCr of 0.86 mg/dL). Liver Function Tests: Recent Labs  Lab 02/12/19 0535 02/13/19 0354  ALBUMIN 3.2* 3.3*   No results for input(s): LIPASE, AMYLASE in the last 168 hours. No results for input(s): AMMONIA in the last 168 hours. Coagulation Profile: No results for input(s): INR, PROTIME in the last 168 hours. Cardiac Enzymes: No results for input(s): CKTOTAL, CKMB, CKMBINDEX, TROPONINI in the last 168 hours. BNP (last 3 results) No results for input(s): PROBNP in the last 8760 hours. HbA1C: No results for input(s): HGBA1C in the last 72 hours. CBG: Recent Labs  Lab 02/16/19 1616 02/16/19 2004 02/16/19 2349 02/17/19 0339 02/17/19 0744  GLUCAP 98 95 113* 129* 107*    Lipid Profile: No results for input(s): CHOL, HDL, LDLCALC, TRIG, CHOLHDL, LDLDIRECT in the last 72 hours. Thyroid Function Tests: No results for input(s): TSH, T4TOTAL, FREET4, T3FREE, THYROIDAB in the last 72 hours. Anemia Panel: No results for input(s): VITAMINB12, FOLATE, FERRITIN, TIBC, IRON, RETICCTPCT in the last 72 hours. Sepsis Labs: Recent Labs  Lab 02/11/19 0857 02/11/19 1213  LATICACIDVEN 0.8 1.1    Recent Results (from the past 240 hour(s))  Culture, blood (routine x 2)     Status: None (Preliminary result)   Collection Time: 02/11/19 11:23 PM  Result Value Ref Range Status   Specimen Description BLOOD RIGHT ANTECUBITAL  Final   Special Requests   Final    BOTTLES DRAWN AEROBIC ONLY Blood Culture adequate volume   Culture   Final    NO GROWTH 3 DAYS Performed at [facility], [address], [place], [place] [address]    Report Status PENDING  Incomplete  Culture, blood (routine x 2)     Status: Abnormal   Collection Time: 02/11/19 11:29 PM  Result Value Ref Range Status   Specimen Description BLOOD RIGHT ANTECUBITAL  Final   Special Requests   Final    BOTTLES DRAWN AEROBIC ONLY Blood Culture adequate volume   Culture  Setup Time   Final    GRAM POSITIVE RODS AEROBIC BOTTLE ONLY CRITICAL RESULT CALLED TO, READ BACK BY AND VERIFIED WITH: PHRMD J ORIET @0544  02/13/19 BY S GEZAHEGN    Culture (A)  Final    DIPHTHEROIDS(CORYNEBACTERIUM SPECIES) Standardized susceptibility testing for this organism is not available. Performed at [facility], [place] [address] [place], [place] [address]    Report Status 02/15/2019 FINAL  Final  Culture, Urine     Status: Abnormal   Collection Time: 02/13/19  1:16 AM  Result Value Ref Range Status   Specimen Description URINE, CLEAN CATCH  Final   Special Requests   Final    NONE Performed at [facility], [place] [address], [place], [place] [address]    Culture 40,000 COLONIES/mL PSEUDOMONAS AERUGINOSA (A)   Final   Report Status 02/15/2019 FINAL  Final   Organism ID, Bacteria PSEUDOMONAS AERUGINOSA (A)  Final      Susceptibility   Pseudomonas aeruginosa - MIC*    CEFTAZIDIME 4 SENSITIVE Sensitive     CIPROFLOXACIN <=0.25 SENSITIVE Sensitive     GENTAMICIN 2 SENSITIVE Sensitive     IMIPENEM 1 SENSITIVE Sensitive     PIP/TAZO 8 SENSITIVE Sensitive     CEFEPIME 2 SENSITIVE Sensitive     * 40,000 COLONIES/mL PSEUDOMONAS AERUGINOSA         Radiology Studies: No results found.      Scheduled Meds: . clindamycin  300 mg Oral Q8H  . lacosamide  200 mg Oral BID  . levETIRAcetam  1,500 mg Oral BID  . pantoprazole  40 mg Oral Daily  . phenytoin  100 mg Oral TID  . sodium chloride flush  10-40 mL Intracatheter Q12H  . topiramate  200 mg Oral BID   Continuous Infusions:    LOS: 13 days        Aline August, MD [facility] 02/17/2019, 7:47 AM

## 2019-02-18 LAB — GLUCOSE, CAPILLARY
Glucose-Capillary: 105 mg/dL — ABNORMAL HIGH (ref 70–99)
Glucose-Capillary: 107 mg/dL — ABNORMAL HIGH (ref 70–99)
Glucose-Capillary: 107 mg/dL — ABNORMAL HIGH (ref 70–99)
Glucose-Capillary: 111 mg/dL — ABNORMAL HIGH (ref 70–99)
Glucose-Capillary: 94 mg/dL (ref 70–99)

## 2019-02-18 MED ORDER — ENOXAPARIN SODIUM 40 MG/0.4ML ~~LOC~~ SOLN
40.0000 mg | Freq: Every day | SUBCUTANEOUS | Status: DC
  Administered 2019-02-19 – 2019-02-22 (×4): 40 mg via SUBCUTANEOUS
  Filled 2019-02-18 (×6): qty 0.4

## 2019-02-18 NOTE — Progress Notes (Signed)
PT Cancellation Note  Patient Details Name: Travis Briggs. MRN: [id number] DOB: 1975/12/14   Cancelled Treatment:     pt napping and wanted to continue to do so.  Pt has been evaluated with rec for SNF.  Last session amb 240' with much assist.  Will attempt another day as schedule permits.   Rica Koyanagi  PTA Acute  Rehabilitation Services Pager      [phone] Office      [phone]

## 2019-02-18 NOTE — Progress Notes (Signed)
  Speech Language Pathology Treatment: Cognitive-Linquistic  Patient Details Name: Travis Briggs. MRN: [id number] DOB: 11-Sep-1976 Today's Date: 02/18/2019 Time: 6712-4580 SLP Time Calculation (min) (ACUTE ONLY): 15 min  Assessment / Plan / Recommendation Clinical Impression  Pt participatory, eager to communicate.  Output c/b significant paraphasias and perseverations.  During initial session, he showed four fingers on each hand to indicate age; he stated he was from [facility]; and he conveyed the notion that he wanted to go home.  He stated his DOB, then began perseverating on the word "[age]" from 1976-05-20 and had great difficulty ceasing and shifting that response, despite max multimodal cues from the clinician.  At end of session, he was able to break perseverations and followed simple commands within predictable contexts with 90% accuracy and demonstrated yes/no accuracy for biographical info with 80% accuracy.  Pt will continue to benefit from SLP intervention to address aphasia.    HPI HPI: Pt is a 43 y.o. 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 EMS was called after bystanders observed patient having a seizure at a local bus stop. When EMS arrived they noted him having tonic-clonic seizure activity. Patient was given 5 mg of Versed IM. In ED he was noted to have right hemiparesis, right gaze preference, and a right dense aphasia with inability to speak.  MRI of the brain revealed bifrontal, left temporal and parietal encephalomalacia with no acute intracranial abnormality identified. EEG was performed and showed evidence of cerebral dysfunction in theleft frontal region with epileptogenicity.Frequent seizures were recorded from the left frontal region lasting on average 1-2 minutes with no apparent clinical signs on video.       SLP Plan  Continue with  current plan of care       Recommendations                   Oral Care Recommendations: Oral care BID Follow up Recommendations: Skilled Nursing facility SLP Visit Diagnosis: Aphasia (R47.01) Plan: Continue with current plan of care       GO                Juan Quam Laurice 02/18/2019, 12:11 PM  Shantice Menger L. Tivis Ringer, [place] Office number [phone] Pager [phone]

## 2019-02-18 NOTE — Progress Notes (Signed)
Occupational Therapy Treatment Patient Details Name: Travis Briggs. MRN: [id number] DOB: 1976-07-24 Today's Date: 02/18/2019    History of present illness 43 y/o male admitted secondary to seizure like activity at a bus stop. Per notes, pt with hx of TBI s/p craniotomy.    OT comments  Patient seated on commode upon entry.  Completed toileting and toilet transfers with min guard assist for safety/balance.  Engaged in grooming tasks standing at sink with min guard assist for standing balance and min-mod cues for problem solving/sequencing tasks (to use soap, and to brush teeth with toothbrush).  Patient requires min guard for safety throughout session, cueing to keep helmet on when OOB (pt hesitant and pulls it off standing at the sink at one point). Will follow, dc plan remains appropriate.    Follow Up Recommendations  SNF;Supervision/Assistance - 24 hour    Equipment Recommendations       Recommendations for Other Services      Precautions / Restrictions Precautions Precautions: Fall Precaution Comments: seizure Required Braces or Orthoses: Other Brace Other Brace: Helmet when OOB Restrictions Weight Bearing Restrictions: No       Mobility Bed Mobility Overal bed mobility: Needs Assistance Bed Mobility: Sit to Supine       Sit to supine: Supervision   General bed mobility comments: for safety  Transfers Overall transfer level: Needs assistance Equipment used: 1 person hand held assist Transfers: Sit to/from Stand Sit to Stand: Min guard         General transfer comment: min guard for safety and balance    Balance Overall balance assessment: Needs assistance Sitting-balance support: Feet supported;No upper extremity supported Sitting balance-Leahy Scale: Good     Standing balance support: During functional activity;Single extremity supported;No upper extremity supported Standing balance-Leahy Scale: Fair Standing balance comment: min guard  assist for safety                            ADL either performed or assessed with clinical judgement   ADL Overall ADL's : Needs assistance/impaired     Grooming: Wash/dry hands;Oral care;Wash/dry face;Min guard;Standing;Cueing for sequencing Grooming Details (indicate cue type and reason): min guard for standing, mod cueing for sequencing hand washing and oral care; pt initally attempting to use toothbrush to wash hands, redirected to brush teeth with cueing                  Toilet Transfer: Min guard;Ambulation   Toileting- Clothing Manipulation and Hygiene: Min guard;Sit to/from stand       Functional mobility during ADLs: Min guard;Minimal assistance;Cueing for safety;Cueing for sequencing General ADL Comments: pt perseverating on needing to "clean up" underneath IV pole, min guard assist to dry floor with towel using foot and then retrieved towel from floor with min guard assist; cueing to wear helmet when OOB, pt resistive but agreeable      Vision       Perception     Praxis      Cognition Arousal/Alertness: Awake/alert Behavior During Therapy: WFL for tasks assessed/performed Overall Cognitive Status: History of cognitive impairments - at baseline Area of Impairment: Attention;Memory;Following commands;Safety/judgement;Awareness;Problem solving                   Current Attention Level: Focused Memory: Decreased short-term memory Following Commands: Follows one step commands consistently;Follows one step commands with increased time Safety/Judgement: Decreased awareness of safety;Decreased awareness of deficits Awareness: Intellectual Problem Solving: Difficulty  sequencing;Requires verbal cues;Requires tactile cues;Slow processing;Decreased initiation General Comments: pt with hx of TBI, able to follow 1 step commands, requires cueing for sequencing ADLs        Exercises     Shoulder Instructions       General Comments       Pertinent Vitals/ Pain       Pain Assessment: Faces Faces Pain Scale: No hurt  Home Living                                          Prior Functioning/Environment              Frequency  Min 3X/week        Progress Toward Goals  OT Goals(current goals can now be found in the care plan section)  Progress towards OT goals: Progressing toward goals     Plan Discharge plan remains appropriate;Frequency remains appropriate    Co-evaluation                 AM-PAC OT "6 Clicks" Daily Activity     Outcome Measure   Help from another person eating meals?: A Little Help from another person taking care of personal grooming?: A Little Help from another person toileting, which includes using toliet, bedpan, or urinal?: A Little Help from another person bathing (including washing, rinsing, drying)?: A Little Help from another person to put on and taking off regular upper body clothing?: A Little Help from another person to put on and taking off regular lower body clothing?: A Little 6 Click Score: 18    End of Session    OT Visit Diagnosis: Unsteadiness on feet (R26.81);Muscle weakness (generalized) (M62.81);Apraxia (R48.2);Other symptoms and signs involving the nervous system (R29.898);Other symptoms and signs involving cognitive function;Hemiplegia and hemiparesis Hemiplegia - Right/Left: Right   Activity Tolerance Patient tolerated treatment well   Patient Left in bed;with call bell/phone within reach;with bed alarm set   Nurse Communication Mobility status;Precautions        Time: 1610-9604 OT Time Calculation (min): 25 min  Charges: OT General Charges $OT Visit: 1 Visit OT Treatments $Self Care/Home Management : 23-37 mins  Delight Stare, [place] Pager [phone] Office [phone]    Delight Stare 02/18/2019, 1:46 PM

## 2019-02-18 NOTE — Progress Notes (Signed)
Patient ID: Travis Briggs., male   DOB: Dec 12, 1975, 43 y.o.   MRN: [id number]  PROGRESS NOTE    Travis Blanks Levora Dredge.  ZYY:[id number] DOB: 10-24-1975 DOA: 02/03/2019 PCP: System, Pcp Not In   Brief Narrative:  43 year old male with history of traumatic brain injury status post craniotomy/cranioplasty presented with seizures.  He was found to have status epilepticus.  Neurology was consulted.  He was started on antiepileptics.  Assessment & Plan:   Principal Problem:   Status epilepticus ([place]) Active Problems:   Dental cyst   Stroke (cerebrum) ([facility])   Thrombocytopenia ([facility])  Status epilepticus in a patient with history of traumatic brain injury status post craniotomy/cranioplasty: Improving Todd's paresis: Improving -Improving.  Neurology evaluation and follow-up appreciated.  Neurology has signed off.  Currently on oral Vimpat, Topamax, Keppra and Dilantin.  Neurology recommends to continue current four antiepileptic agents with a goal to eventually taper to 2 agents over the next few weeks to prevent rebound seizures.  This can be done as an outpatient with close follow-up with outpatient neurologist.  Neurology recommends to first taper off Vimpat and then Topamax, with final regimen of Keppra and Dilantin.  Neurology discontinued Ativan on 02/13/2019.   -Use ativan only for agitation  -Continue seizure precautions. -02/09/19: EEG showed no seizures -No recent seizures   Acute encephalopathy -Probably secondary to above. -Patient has poor memory at baseline along with word finding difficulty/aphasia.  Patient is supposed to wear cranial helmet.  Dental cyst -CT showing cystic lesion involving the right mandible which is associated with right lower premolar dental root.  This is thought to be most likely a dental cyst which could be due to radicular cyst or chronic infection. -We will need outpatient dentistry evaluation and follow-up -Treated with clindamycin  which was  discontinued on 02/17/2019. -afebrile currently  Leukocytosis -Resolved  Gram-positive rod bacteremia: 1 out of 4 bottles positive -Probably contaminant.  Probable UTI -Urine culture grew Pseudomonas.  Patient was on Rocephin and not spiking temperatures.  Pseudomonas might be colonizer.  Rocephin discontinued on 02/15/2019.  No temperature spikes since then.   DVT prophylaxis: SCDs.  Start Lovenox Code Status: Full Family Communication: None at bedside Disposition Plan: SNF once bed is available.  Patient is medically stable for discharge  Consultants: Neurology  Procedures: EEG  Antimicrobials:  Anti-infectives (From admission, onward)   Start     Dose/Rate Route Frequency Ordered Stop   02/15/19 1400  clindamycin (CLEOCIN) capsule 300 mg  Status:  Discontinued     300 mg Oral Every 8 hours 02/15/19 0856 02/17/19 0748   02/12/19 2030  cefTRIAXone (ROCEPHIN) 2 g in sodium chloride 0.9 % 100 mL IVPB  Status:  Discontinued     2 g 200 mL/hr over 30 Minutes Intravenous Every 24 hours 02/12/19 1947 02/15/19 0859   02/11/19 2200  clindamycin (CLEOCIN) IVPB 600 mg  Status:  Discontinued     600 mg 100 mL/hr over 30 Minutes Intravenous Every 8 hours 02/11/19 2051 02/15/19 0856         Subjective: Patient seen and examined at bedside.  He is awake, hardly answers any questions appropriately.  Poor historian.  No overnight fever or seizures reported.   Objective: Vitals:   02/17/19 1600 02/17/19 1954 02/18/19 0054 02/18/19 0311  BP: 119/81 119/86 96/73 112/80  Pulse: (!) 58 (!) 53 62 (!) 59  Resp: 16 18 18 18   Temp: 98.9 F (37.2 C) 98.4 F (36.9 C) 98.4 F (36.9  C) 97.6 F (36.4 C)  TempSrc: Oral Oral Oral Oral  SpO2: 100% 100% 97% 100%  Weight:      Height:        Intake/Output Summary (Last 24 hours) at 02/18/2019 0726 Last data filed at 02/18/2019 7494 Gross per 24 hour  Intake -  Output 1000 ml  Net -1000 ml   Filed Weights   02/03/19 1642 02/09/19 0305  02/15/19 0100  Weight: 72.6 kg 73.1 kg 73.2 kg    Examination:  General exam: No acute distress, looks older than stated age.  Awake but does not answer questions appropriately.  No seizure-like activities currently. Respiratory system: Bilateral decreased breath sounds at bases, no wheezing Cardiovascular system: S1-S2 heard, rate controlled Gastrointestinal system: Abdomen is nondistended, soft and nontender. Normal bowel sounds heard. Extremities: No cyanosis, edema     Data Reviewed: I have personally reviewed following labs and imaging studies  CBC: Recent Labs  Lab 02/11/19 1213 02/12/19 0535 02/13/19 0354 02/14/19 0609  WBC 19.1* 17.4* 13.0* 7.7  NEUTROABS  --   --  10.4* 5.1  HGB 12.9* 12.3* 12.0* 11.5*  HCT 38.8* 36.5* 36.2* 35.2*  MCV 91.9 92.4 92.6 93.4  PLT 202 196 219 496   Basic Metabolic Panel: Recent Labs  Lab 02/12/19 0535 02/13/19 0354 02/14/19 0609  NA 138 138 138  K 3.8 4.1 3.9  CL 112* 107 107  CO2 18* 16* 19*  GLUCOSE 121* 120* 118*  BUN 8 7 6   CREATININE 0.95 0.82 0.86  CALCIUM 8.7* 8.9 8.9  MG 2.0 2.1 2.1  PHOS  --  2.8  --    GFR: Estimated Creatinine Clearance: 109.6 mL/min (by C-G formula based on SCr of 0.86 mg/dL). Liver Function Tests: Recent Labs  Lab 02/12/19 0535 02/13/19 0354  ALBUMIN 3.2* 3.3*   No results for input(s): LIPASE, AMYLASE in the last 168 hours. No results for input(s): AMMONIA in the last 168 hours. Coagulation Profile: No results for input(s): INR, PROTIME in the last 168 hours. Cardiac Enzymes: No results for input(s): CKTOTAL, CKMB, CKMBINDEX, TROPONINI in the last 168 hours. BNP (last 3 results) No results for input(s): PROBNP in the last 8760 hours. HbA1C: No results for input(s): HGBA1C in the last 72 hours. CBG: Recent Labs  Lab 02/17/19 1139 02/17/19 1557 02/17/19 1957 02/18/19 0051 02/18/19 0348  GLUCAP 106* 101* 110* 107* 105*   Lipid Profile: No results for input(s): CHOL, HDL,  LDLCALC, TRIG, CHOLHDL, LDLDIRECT in the last 72 hours. Thyroid Function Tests: No results for input(s): TSH, T4TOTAL, FREET4, T3FREE, THYROIDAB in the last 72 hours. Anemia Panel: No results for input(s): VITAMINB12, FOLATE, FERRITIN, TIBC, IRON, RETICCTPCT in the last 72 hours. Sepsis Labs: Recent Labs  Lab 02/11/19 0857 02/11/19 1213  LATICACIDVEN 0.8 1.1    Recent Results (from the past 240 hour(s))  Culture, blood (routine x 2)     Status: None   Collection Time: 02/11/19 11:23 PM  Result Value Ref Range Status   Specimen Description BLOOD RIGHT ANTECUBITAL  Final   Special Requests   Final    BOTTLES DRAWN AEROBIC ONLY Blood Culture adequate volume   Culture   Final    NO GROWTH 5 DAYS Performed at [facility], [address] [place], [place] [address]    Report Status 02/17/2019 FINAL  Final  Culture, blood (routine x 2)     Status: Abnormal   Collection Time: 02/11/19 11:29 PM  Result Value Ref Range Status  Specimen Description BLOOD RIGHT ANTECUBITAL  Final   Special Requests   Final    BOTTLES DRAWN AEROBIC ONLY Blood Culture adequate volume   Culture  Setup Time   Final    GRAM POSITIVE RODS AEROBIC BOTTLE ONLY CRITICAL RESULT CALLED TO, READ BACK BY AND VERIFIED WITH: PHRMD J ORIET @0544  02/13/19 BY S GEZAHEGN    Culture (A)  Final    DIPHTHEROIDS(CORYNEBACTERIUM SPECIES) Standardized susceptibility testing for this organism is not available. Performed at [facility], [place] [address], [place], [place] [address]    Report Status 02/15/2019 FINAL  Final  Culture, Urine     Status: Abnormal   Collection Time: 02/13/19  1:16 AM  Result Value Ref Range Status   Specimen Description URINE, CLEAN CATCH  Final   Special Requests   Final    NONE Performed at [facility], [place] [address], [place], [place] [address]    Culture 40,000 COLONIES/mL PSEUDOMONAS AERUGINOSA (A)  Final   Report Status 02/15/2019 FINAL  Final   Organism ID, Bacteria  PSEUDOMONAS AERUGINOSA (A)  Final      Susceptibility   Pseudomonas aeruginosa - MIC*    CEFTAZIDIME 4 SENSITIVE Sensitive     CIPROFLOXACIN <=0.25 SENSITIVE Sensitive     GENTAMICIN 2 SENSITIVE Sensitive     IMIPENEM 1 SENSITIVE Sensitive     PIP/TAZO 8 SENSITIVE Sensitive     CEFEPIME 2 SENSITIVE Sensitive     * 40,000 COLONIES/mL PSEUDOMONAS AERUGINOSA  SARS Coronavirus 2 (CEPHEID- Performed in [place] [facility] lab), Hosp Order     Status: None   Collection Time: 02/17/19 12:15 PM  Result Value Ref Range Status   SARS Coronavirus 2 NEGATIVE NEGATIVE Final    Comment: (NOTE) If result is NEGATIVE SARS-CoV-2 target nucleic acids are NOT DETECTED. The SARS-CoV-2 RNA is generally detectable in upper and lower  respiratory specimens during the acute phase of infection. The lowest  concentration of SARS-CoV-2 viral copies this assay can detect is 250  copies / mL. A negative result does not preclude SARS-CoV-2 infection  and should not be used as the sole basis for treatment or other  patient management decisions.  A negative result may occur with  improper specimen collection / handling, submission of specimen other  than nasopharyngeal swab, presence of viral mutation(s) within the  areas targeted by this assay, and inadequate number of viral copies  (<250 copies / mL). A negative result must be combined with clinical  observations, patient history, and epidemiological information. If result is POSITIVE SARS-CoV-2 target nucleic acids are DETECTED. The SARS-CoV-2 RNA is generally detectable in upper and lower  respiratory specimens dur ing the acute phase of infection.  Positive  results are indicative of active infection with SARS-CoV-2.  Clinical  correlation with patient history and other diagnostic information is  necessary to determine patient infection status.  Positive results do  not rule out bacterial infection or co-infection with other viruses. If result is  PRESUMPTIVE POSTIVE SARS-CoV-2 nucleic acids MAY BE PRESENT.   A presumptive positive result was obtained on the submitted specimen  and confirmed on repeat testing.  While 2019 novel coronavirus  (SARS-CoV-2) nucleic acids may be present in the submitted sample  additional confirmatory testing may be necessary for epidemiological  and / or clinical management purposes  to differentiate between  SARS-CoV-2 and other Sarbecovirus currently known to infect humans.  If clinically indicated additional testing with an alternate test  methodology [phone])  is advised. The SARS-CoV-2 RNA is generally  detectable in upper and lower respiratory sp ecimens during the acute  phase of infection. The expected result is Negative. Fact Sheet for Patients:  StrictlyIdeas.no Fact Sheet for Healthcare Providers: BankingDealers.co.za This test is not yet approved or cleared by the [place] FDA and has been authorized for detection and/or diagnosis of SARS-CoV-2 by FDA under an Emergency Use Authorization (EUA).  This EUA will remain in effect (meaning this test can be used) for the duration of the COVID-19 declaration under Section 564(b)(1) of the Act, 21 U.S.C. section 360bbb-3(b)(1), unless the authorization is terminated or revoked sooner. Performed at [facility], [place] [address] [place], [place] [address]          Radiology Studies: No results found.      Scheduled Meds: . lacosamide  200 mg Oral BID  . levETIRAcetam  1,500 mg Oral BID  . pantoprazole  40 mg Oral Daily  . phenytoin  100 mg Oral TID  . sodium chloride flush  10-40 mL Intracatheter Q12H  . topiramate  200 mg Oral BID   Continuous Infusions:    LOS: 14 days        Aline August, MD [facility] 02/18/2019, 7:26 AM

## 2019-02-19 LAB — GLUCOSE, CAPILLARY
Glucose-Capillary: 109 mg/dL — ABNORMAL HIGH (ref 70–99)
Glucose-Capillary: 83 mg/dL (ref 70–99)
Glucose-Capillary: 88 mg/dL (ref 70–99)
Glucose-Capillary: 99 mg/dL (ref 70–99)

## 2019-02-19 MED ORDER — ENSURE ENLIVE PO LIQD
237.0000 mL | Freq: Three times a day (TID) | ORAL | Status: DC
  Administered 2019-02-19 – 2019-02-22 (×8): 237 mL via ORAL

## 2019-02-19 MED ORDER — ADULT MULTIVITAMIN W/MINERALS CH
1.0000 | ORAL_TABLET | Freq: Every day | ORAL | Status: DC
  Administered 2019-02-19 – 2019-02-22 (×4): 1 via ORAL
  Filled 2019-02-19 (×4): qty 1

## 2019-02-19 NOTE — Progress Notes (Signed)
Initial Nutrition Assessment  RD working remotely.  DOCUMENTATION CODES:   Not applicable  INTERVENTION:   - Ensure Enlive po TID, each supplement provides 350 kcal and 20 grams of protein  - MVI with minerals daily  NUTRITION DIAGNOSIS:   Inadequate oral intake related to lethargy/confusion as evidenced by meal completion < 50%.  GOAL:   Patient will meet greater than or equal to 90% of their needs  MONITOR:   PO intake, Supplement acceptance, Labs, Weight trends  REASON FOR ASSESSMENT:   LOS    ASSESSMENT:   43 year old male who presented to the ED on 4/26 after a witnessed seizure. 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 PMH of TBI and prior craniectomy and cranioplasty.  Noted pt is medically stable for discharge but has no SNF bed offers at this time.  No weight history available in pt's chart.  Attempted to speak with pt via phone call to room but pt did not answer.  Given poor meal completion, RD to order oral nutrition supplement to aid pt in meeting kcal and protein needs. Will also order daily MVI with minerals.  Meal Completion: 25-70% x last 8 recorded meals (averaging 35%)  Medications reviewed and include: Protonix  Labs reviewed. CBG's: 88, 109, 99, 94, 83 x 24 hours  NUTRITION - FOCUSED PHYSICAL EXAM:  Unable to complete at this time. RD working remotely.  Diet Order:   Diet Order            DIET DYS 3 Room service appropriate? No; Fluid consistency: Thin  Diet effective now              EDUCATION NEEDS:   Not appropriate for education at this time  Skin:  Skin Assessment: Reviewed RN Assessment  Last BM:  02/15/19  Height:   Ht Readings from Last 1 Encounters:  02/03/19 5' 9"  (1.753 m)    Weight:   Wt Readings from Last 1 Encounters:  02/15/19 73.2 kg    Ideal Body Weight:  72.7 kg  BMI:  Body mass index is 23.83 kg/m.  Estimated Nutritional Needs:   Kcal:   2000-2200  Protein:  100-115 grams  Fluid:  >/= 2.0 L    Gaynell Face, MS, RD, LDN Inpatient Clinical Dietitian Pager: [phone] Weekend/After Hours: [phone]

## 2019-02-19 NOTE — Plan of Care (Signed)
progressing 

## 2019-02-19 NOTE — Progress Notes (Signed)
Not able to flush midline, to hard to push

## 2019-02-19 NOTE — Progress Notes (Signed)
Patient ID: Travis Briggs., male   DOB: 09-20-1976, 43 y.o.   MRN: [id number]  PROGRESS NOTE    [name]  EHM:[id number] DOB: [birth year] DOA: 02/03/2019 PCP: System, Pcp Not In   Brief Narrative:  43 year old male with history of traumatic brain injury status post craniotomy/cranioplasty presented with seizures.  He was found to have status epilepticus.  Neurology was consulted.  He was started on antiepileptics.  Assessment & Plan:   Principal Problem:   Status epilepticus ([place]) Active Problems:   Dental cyst   Stroke (cerebrum) (HCC)   Thrombocytopenia ([facility])  Status epilepticus in a patient with history of traumatic brain injury status post craniotomy/cranioplasty: Improving Todd's paresis: Improving -Improving.  Neurology evaluation and follow-up appreciated.  Neurology has signed off.  Currently on oral Vimpat, Topamax, Keppra and Dilantin.  Neurology recommends to continue current four antiepileptic agents with a goal to eventually taper to 2 agents over the next few weeks to prevent rebound seizures.  This can be done as an outpatient with close follow-up with outpatient neurologist.  Neurology recommends to first taper off Vimpat and then Topamax, with final regimen of Keppra and Dilantin.  Neurology discontinued Ativan on 02/13/2019.   -Use ativan only for agitation  -Continue seizure precautions. -02/09/19: EEG showed no seizures -No recent seizures reported   Acute encephalopathy -Probably secondary to above. -Patient has poor memory at baseline along with word finding difficulty/aphasia.  Patient is supposed to wear cranial helmet.  Dental cyst -CT showing cystic lesion involving the right mandible which is associated with right lower premolar dental root.  This is thought to be most likely a dental cyst which could be due to radicular cyst or chronic infection. -We will need outpatient dentistry evaluation and follow-up -Treated with clindamycin   which was discontinued on 02/17/2019. -afebrile currently  Leukocytosis -Resolved  Gram-positive rod bacteremia: 1 out of 4 bottles positive -Probably contaminant.  Probable UTI -Urine culture grew Pseudomonas.  Patient was on Rocephin and not spiking temperatures.  Pseudomonas might be colonizer.  Rocephin discontinued on 02/15/2019.  No temperature spikes since then.   DVT prophylaxis: Lovenox Code Status: Full Family Communication: None at bedside Disposition Plan: SNF once bed is available.  Patient is medically stable for discharge  Consultants: Neurology  Procedures: EEG  Antimicrobials:  Anti-infectives (From admission, onward)   Start     Dose/Rate Route Frequency Ordered Stop   02/15/19 1400  clindamycin (CLEOCIN) capsule 300 mg  Status:  Discontinued     300 mg Oral Every 8 hours 02/15/19 0856 02/17/19 0748   02/12/19 2030  cefTRIAXone (ROCEPHIN) 2 g in sodium chloride 0.9 % 100 mL IVPB  Status:  Discontinued     2 g 200 mL/hr over 30 Minutes Intravenous Every 24 hours 02/12/19 1947 02/15/19 0859   02/11/19 2200  clindamycin (CLEOCIN) IVPB 600 mg  Status:  Discontinued     600 mg 100 mL/hr over 30 Minutes Intravenous Every 8 hours 02/11/19 2051 02/15/19 0856         Subjective: Patient seen and examined at bedside.  Poor historian.  He is awake, hardly answers questions appropriately.  No seizures reported overnight.  Objective: Vitals:   02/18/19 2014 02/19/19 0022 02/19/19 0430 02/19/19 0829  BP: 123/86 (!) 119/93 (!) 123/93 117/87  Pulse: (!) 50 (!) 55 60 (!) 47  Resp: 16 17 16 17   Temp: 98.2 F (36.8 C) 97.9 F (36.6 C) 98.1 F (36.7 C) 98.3 F (36.8  C)  TempSrc: Oral Oral Oral Oral  SpO2: 100% 100% 100% 100%  Weight:      Height:        Intake/Output Summary (Last 24 hours) at 02/19/2019 0946 Last data filed at 02/19/2019 0831 Gross per 24 hour  Intake 1080 ml  Output 625 ml  Net 455 ml   Filed Weights   02/03/19 1642 02/09/19 0305 02/15/19  0100  Weight: 72.6 kg 73.1 kg 73.2 kg    Examination:  General exam: No distress.  Looks older than stated age.  Awake but does not answer questions appropriately.  No seizure-like activities currently. Respiratory system: Bilateral decreased breath sounds at bases, no wheezing Cardiovascular system: S1-S2 heard, intermittent bradycardia  gastrointestinal system: Abdomen is nondistended, soft and nontender. Normal bowel sounds heard. Extremities: No cyanosis, edema     Data Reviewed: I have personally reviewed following labs and imaging studies  CBC: Recent Labs  Lab 02/13/19 0354 02/14/19 0609  WBC 13.0* 7.7  NEUTROABS 10.4* 5.1  HGB 12.0* 11.5*  HCT 36.2* 35.2*  MCV 92.6 93.4  PLT 219 856   Basic Metabolic Panel: Recent Labs  Lab 02/13/19 0354 02/14/19 0609  NA 138 138  K 4.1 3.9  CL 107 107  CO2 16* 19*  GLUCOSE 120* 118*  BUN 7 6  CREATININE 0.82 0.86  CALCIUM 8.9 8.9  MG 2.1 2.1  PHOS 2.8  --    GFR: Estimated Creatinine Clearance: 109.6 mL/min (by C-G formula based on SCr of 0.86 mg/dL). Liver Function Tests: Recent Labs  Lab 02/13/19 0354  ALBUMIN 3.3*   No results for input(s): LIPASE, AMYLASE in the last 168 hours. No results for input(s): AMMONIA in the last 168 hours. Coagulation Profile: No results for input(s): INR, PROTIME in the last 168 hours. Cardiac Enzymes: No results for input(s): CKTOTAL, CKMB, CKMBINDEX, TROPONINI in the last 168 hours. BNP (last 3 results) No results for input(s): PROBNP in the last 8760 hours. HbA1C: No results for input(s): HGBA1C in the last 72 hours. CBG: Recent Labs  Lab 02/18/19 1640 02/18/19 2011 02/19/19 0020 02/19/19 0427 02/19/19 0825  GLUCAP 83 94 99 109* 88   Lipid Profile: No results for input(s): CHOL, HDL, LDLCALC, TRIG, CHOLHDL, LDLDIRECT in the last 72 hours. Thyroid Function Tests: No results for input(s): TSH, T4TOTAL, FREET4, T3FREE, THYROIDAB in the last 72 hours. Anemia Panel: No  results for input(s): VITAMINB12, FOLATE, FERRITIN, TIBC, IRON, RETICCTPCT in the last 72 hours. Sepsis Labs: No results for input(s): PROCALCITON, LATICACIDVEN in the last 168 hours.  Recent Results (from the past 240 hour(s))  Culture, blood (routine x 2)     Status: None   Collection Time: 02/11/19 11:23 PM  Result Value Ref Range Status   Specimen Description BLOOD RIGHT ANTECUBITAL  Final   Special Requests   Final    BOTTLES DRAWN AEROBIC ONLY Blood Culture adequate volume   Culture   Final    NO GROWTH 5 DAYS Performed at [facility], [address], [place], [place] [address]    Report Status 02/17/2019 FINAL  Final  Culture, blood (routine x 2)     Status: Abnormal   Collection Time: 02/11/19 11:29 PM  Result Value Ref Range Status   Specimen Description BLOOD RIGHT ANTECUBITAL  Final   Special Requests   Final    BOTTLES DRAWN AEROBIC ONLY Blood Culture adequate volume   Culture  Setup Time   Final    GRAM POSITIVE RODS  AEROBIC BOTTLE ONLY CRITICAL RESULT CALLED TO, READ BACK BY AND VERIFIED WITH: PHRMD J ORIET @0544  02/13/19 BY S GEZAHEGN    Culture (A)  Final    DIPHTHEROIDS(CORYNEBACTERIUM SPECIES) Standardized susceptibility testing for this organism is not available. Performed at [facility], [place] [address] [place], [place] [address]    Report Status 02/15/2019 FINAL  Final  Culture, Urine     Status: Abnormal   Collection Time: 02/13/19  1:16 AM  Result Value Ref Range Status   Specimen Description URINE, CLEAN CATCH  Final   Special Requests   Final    NONE Performed at [facility], [place] [address], [place], [place] [address]    Culture 40,000 COLONIES/mL PSEUDOMONAS AERUGINOSA (A)  Final   Report Status 02/15/2019 FINAL  Final   Organism ID, Bacteria PSEUDOMONAS AERUGINOSA (A)  Final      Susceptibility   Pseudomonas aeruginosa - MIC*    CEFTAZIDIME 4 SENSITIVE Sensitive     CIPROFLOXACIN <=0.25 SENSITIVE Sensitive     GENTAMICIN 2  SENSITIVE Sensitive     IMIPENEM 1 SENSITIVE Sensitive     PIP/TAZO 8 SENSITIVE Sensitive     CEFEPIME 2 SENSITIVE Sensitive     * 40,000 COLONIES/mL PSEUDOMONAS AERUGINOSA  SARS Coronavirus 2 (CEPHEID- Performed in [place] [facility] lab), Hosp Order     Status: None   Collection Time: 02/17/19 12:15 PM  Result Value Ref Range Status   SARS Coronavirus 2 NEGATIVE NEGATIVE Final    Comment: (NOTE) If result is NEGATIVE SARS-CoV-2 target nucleic acids are NOT DETECTED. The SARS-CoV-2 RNA is generally detectable in upper and lower  respiratory specimens during the acute phase of infection. The lowest  concentration of SARS-CoV-2 viral copies this assay can detect is 250  copies / mL. A negative result does not preclude SARS-CoV-2 infection  and should not be used as the sole basis for treatment or other  patient management decisions.  A negative result may occur with  improper specimen collection / handling, submission of specimen other  than nasopharyngeal swab, presence of viral mutation(s) within the  areas targeted by this assay, and inadequate number of viral copies  (<250 copies / mL). A negative result must be combined with clinical  observations, patient history, and epidemiological information. If result is POSITIVE SARS-CoV-2 target nucleic acids are DETECTED. The SARS-CoV-2 RNA is generally detectable in upper and lower  respiratory specimens dur ing the acute phase of infection.  Positive  results are indicative of active infection with SARS-CoV-2.  Clinical  correlation with patient history and other diagnostic information is  necessary to determine patient infection status.  Positive results do  not rule out bacterial infection or co-infection with other viruses. If result is PRESUMPTIVE POSTIVE SARS-CoV-2 nucleic acids MAY BE PRESENT.   A presumptive positive result was obtained on the submitted specimen  and confirmed on repeat testing.  While 2019 novel coronavirus   (SARS-CoV-2) nucleic acids may be present in the submitted sample  additional confirmatory testing may be necessary for epidemiological  and / or clinical management purposes  to differentiate between  SARS-CoV-2 and other Sarbecovirus currently known to infect humans.  If clinically indicated additional testing with an alternate test  methodology [phone]) is advised. The SARS-CoV-2 RNA is generally  detectable in upper and lower respiratory sp ecimens during the acute  phase of infection. The expected result is Negative. Fact Sheet for Patients:  StrictlyIdeas.no Fact Sheet for Healthcare Providers: BankingDealers.co.za  This test is not yet approved or cleared by the [place] and has been authorized for detection and/or diagnosis of SARS-CoV-2 by FDA under an Emergency Use Authorization (EUA).  This EUA will remain in effect (meaning this test can be used) for the duration of the COVID-19 declaration under Section 564(b)(1) of the Act, 21 U.S.C. section 360bbb-3(b)(1), unless the authorization is terminated or revoked sooner. Performed at [facility], [place] [address], [place], [place] [address]          Radiology Studies: No results found.      Scheduled Meds: . enoxaparin (LOVENOX) injection  40 mg Subcutaneous Daily  . lacosamide  200 mg Oral BID  . levETIRAcetam  1,500 mg Oral BID  . pantoprazole  40 mg Oral Daily  . phenytoin  100 mg Oral TID  . sodium chloride flush  10-40 mL Intracatheter Q12H  . topiramate  200 mg Oral BID   Continuous Infusions:    LOS: 15 days        Aline August, MD [facility] 02/19/2019, 9:46 AM

## 2019-02-19 NOTE — Progress Notes (Deleted)
Patient ID: Travis Farmer., male   DOB: 04-09-1976, 43 y.o.   MRN: [id number]  PROGRESS NOTE    Travis Blanks Levora Dredge.  Travis Briggs DOB: 06-Mar-1976 DOA: 02/03/2019 PCP: System, Pcp Not In   Brief Narrative:  43 year old male with history of traumatic brain injury status post craniotomy/cranioplasty presented with seizures.  He was found to have status epilepticus.  Neurology was consulted.  He was started on antiepileptics.  Assessment & Plan:   Principal Problem:   Status epilepticus ([place]) Active Problems:   Dental cyst   Stroke (cerebrum) (HCC)   Thrombocytopenia ([facility])  Status epilepticus in a patient with history of traumatic brain injury status post craniotomy/cranioplasty: Improving Todd's paresis: Improving -Improving.  Neurology evaluation and follow-up appreciated.  Neurology has signed off.  Currently on oral Vimpat, Topamax, Keppra and Dilantin.  Neurology recommends to continue current four antiepileptic agents with a goal to eventually taper to 2 agents over the next few weeks to prevent rebound seizures.  This can be done as an outpatient with close follow-up with outpatient neurologist.  Neurology recommends to first taper off Vimpat and then Topamax, with final regimen of Keppra and Dilantin.  Neurology discontinued Ativan on 02/13/2019.   -Use ativan only for agitation  -Continue seizure precautions. -02/09/19: EEG showed no seizures -No recent seizures reported   Acute encephalopathy -Probably secondary to above. -Patient has poor memory at baseline along with word finding difficulty/aphasia.  Patient is supposed to wear cranial helmet.  Dental cyst -CT showing cystic lesion involving the right mandible which is associated with right lower premolar dental root.  This is thought to be most likely a dental cyst which could be due to radicular cyst or chronic infection. -We will need outpatient dentistry evaluation and follow-up -Treated with clindamycin   which was discontinued on 02/17/2019. -afebrile currently  Leukocytosis -Resolved  Gram-positive rod bacteremia: 1 out of 4 bottles positive -Probably contaminant.  Probable UTI -Urine culture grew Pseudomonas.  Patient was on Rocephin and not spiking temperatures.  Pseudomonas might be colonizer.  Rocephin discontinued on 02/15/2019.  No temperature spikes since then.   DVT prophylaxis: Lovenox Code Status: Full Family Communication: Tried calling sister on phone but she did not pick up. Disposition Plan: SNF once bed is available.  Patient is medically stable for discharge  Consultants: Neurology  Procedures: EEG  Antimicrobials:  Anti-infectives (From admission, onward)   Start     Dose/Rate Route Frequency Ordered Stop   02/15/19 1400  clindamycin (CLEOCIN) capsule 300 mg  Status:  Discontinued     300 mg Oral Every 8 hours 02/15/19 0856 02/17/19 0748   02/12/19 2030  cefTRIAXone (ROCEPHIN) 2 g in sodium chloride 0.9 % 100 mL IVPB  Status:  Discontinued     2 g 200 mL/hr over 30 Minutes Intravenous Every 24 hours 02/12/19 1947 02/15/19 0859   02/11/19 2200  clindamycin (CLEOCIN) IVPB 600 mg  Status:  Discontinued     600 mg 100 mL/hr over 30 Minutes Intravenous Every 8 hours 02/11/19 2051 02/15/19 0856         Subjective: Patient seen and examined at bedside.  Poor historian.  He is awake, hardly answers questions appropriately.  No seizures reported overnight.  Objective: Vitals:   02/18/19 2014 02/19/19 0022 02/19/19 0430 02/19/19 0829  BP: 123/86 (!) 119/93 (!) 123/93 117/87  Pulse: (!) 50 (!) 55 60 (!) 47  Resp: 16 17 16 17   Temp: 98.2 F (36.8 C) 97.9 F (36.6  C) 98.1 F (36.7 C) 98.3 F (36.8 C)  TempSrc: Oral Oral Oral Oral  SpO2: 100% 100% 100% 100%  Weight:      Height:        Intake/Output Summary (Last 24 hours) at 02/19/2019 0948 Last data filed at 02/19/2019 0831 Gross per 24 hour  Intake 1080 ml  Output 625 ml  Net 455 ml   Filed Weights    02/03/19 1642 02/09/19 0305 02/15/19 0100  Weight: 72.6 kg 73.1 kg 73.2 kg    Examination:  General exam: No distress.  Looks older than stated age.  Awake but does not answer questions appropriately.  No seizure-like activities currently. Respiratory system: Bilateral decreased breath sounds at bases, no wheezing Cardiovascular system: S1-S2 heard, intermittent bradycardia  gastrointestinal system: Abdomen is nondistended, soft and nontender. Normal bowel sounds heard. Extremities: No cyanosis, edema     Data Reviewed: I have personally reviewed following labs and imaging studies  CBC: Recent Labs  Lab 02/13/19 0354 02/14/19 0609  WBC 13.0* 7.7  NEUTROABS 10.4* 5.1  HGB 12.0* 11.5*  HCT 36.2* 35.2*  MCV 92.6 93.4  PLT 219 366   Basic Metabolic Panel: Recent Labs  Lab 02/13/19 0354 02/14/19 0609  NA 138 138  K 4.1 3.9  CL 107 107  CO2 16* 19*  GLUCOSE 120* 118*  BUN 7 6  CREATININE 0.82 0.86  CALCIUM 8.9 8.9  MG 2.1 2.1  PHOS 2.8  --    GFR: Estimated Creatinine Clearance: 109.6 mL/min (by C-G formula based on SCr of 0.86 mg/dL). Liver Function Tests: Recent Labs  Lab 02/13/19 0354  ALBUMIN 3.3*   No results for input(s): LIPASE, AMYLASE in the last 168 hours. No results for input(s): AMMONIA in the last 168 hours. Coagulation Profile: No results for input(s): INR, PROTIME in the last 168 hours. Cardiac Enzymes: No results for input(s): CKTOTAL, CKMB, CKMBINDEX, TROPONINI in the last 168 hours. BNP (last 3 results) No results for input(s): PROBNP in the last 8760 hours. HbA1C: No results for input(s): HGBA1C in the last 72 hours. CBG: Recent Labs  Lab 02/18/19 1640 02/18/19 2011 02/19/19 0020 02/19/19 0427 02/19/19 0825  GLUCAP 83 94 99 109* 88   Lipid Profile: No results for input(s): CHOL, HDL, LDLCALC, TRIG, CHOLHDL, LDLDIRECT in the last 72 hours. Thyroid Function Tests: No results for input(s): TSH, T4TOTAL, FREET4, T3FREE, THYROIDAB  in the last 72 hours. Anemia Panel: No results for input(s): VITAMINB12, FOLATE, FERRITIN, TIBC, IRON, RETICCTPCT in the last 72 hours. Sepsis Labs: No results for input(s): PROCALCITON, LATICACIDVEN in the last 168 hours.  Recent Results (from the past 240 hour(s))  Culture, blood (routine x 2)     Status: None   Collection Time: 02/11/19 11:23 PM  Result Value Ref Range Status   Specimen Description BLOOD RIGHT ANTECUBITAL  Final   Special Requests   Final    BOTTLES DRAWN AEROBIC ONLY Blood Culture adequate volume   Culture   Final    NO GROWTH 5 DAYS Performed at [facility], [address], [place], [place] [address]    Report Status 02/17/2019 FINAL  Final  Culture, blood (routine x 2)     Status: Abnormal   Collection Time: 02/11/19 11:29 PM  Result Value Ref Range Status   Specimen Description BLOOD RIGHT ANTECUBITAL  Final   Special Requests   Final    BOTTLES DRAWN AEROBIC ONLY Blood Culture adequate volume   Culture  Setup Time  Final    GRAM POSITIVE RODS AEROBIC BOTTLE ONLY CRITICAL RESULT CALLED TO, READ BACK BY AND VERIFIED WITH: PHRMD J ORIET @0544  02/13/19 BY S GEZAHEGN    Culture (A)  Final    DIPHTHEROIDS(CORYNEBACTERIUM SPECIES) Standardized susceptibility testing for this organism is not available. Performed at [facility], [place] [address], [place], [place] [address]    Report Status 02/15/2019 FINAL  Final  Culture, Urine     Status: Abnormal   Collection Time: 02/13/19  1:16 AM  Result Value Ref Range Status   Specimen Description URINE, CLEAN CATCH  Final   Special Requests   Final    NONE Performed at [facility], [place] [address], [place], [place] [address]    Culture 40,000 COLONIES/mL PSEUDOMONAS AERUGINOSA (A)  Final   Report Status 02/15/2019 FINAL  Final   Organism ID, Bacteria PSEUDOMONAS AERUGINOSA (A)  Final      Susceptibility   Pseudomonas aeruginosa - MIC*    CEFTAZIDIME 4 SENSITIVE Sensitive     CIPROFLOXACIN  <=0.25 SENSITIVE Sensitive     GENTAMICIN 2 SENSITIVE Sensitive     IMIPENEM 1 SENSITIVE Sensitive     PIP/TAZO 8 SENSITIVE Sensitive     CEFEPIME 2 SENSITIVE Sensitive     * 40,000 COLONIES/mL PSEUDOMONAS AERUGINOSA  SARS Coronavirus 2 (CEPHEID- Performed in [place] hospital lab), Hosp Order     Status: None   Collection Time: 02/17/19 12:15 PM  Result Value Ref Range Status   SARS Coronavirus 2 NEGATIVE NEGATIVE Final    Comment: (NOTE) If result is NEGATIVE SARS-CoV-2 target nucleic acids are NOT DETECTED. The SARS-CoV-2 RNA is generally detectable in upper and lower  respiratory specimens during the acute phase of infection. The lowest  concentration of SARS-CoV-2 viral copies this assay can detect is 250  copies / mL. A negative result does not preclude SARS-CoV-2 infection  and should not be used as the sole basis for treatment or other  patient management decisions.  A negative result may occur with  improper specimen collection / handling, submission of specimen other  than nasopharyngeal swab, presence of viral mutation(s) within the  areas targeted by this assay, and inadequate number of viral copies  (<250 copies / mL). A negative result must be combined with clinical  observations, patient history, and epidemiological information. If result is POSITIVE SARS-CoV-2 target nucleic acids are DETECTED. The SARS-CoV-2 RNA is generally detectable in upper and lower  respiratory specimens dur ing the acute phase of infection.  Positive  results are indicative of active infection with SARS-CoV-2.  Clinical  correlation with patient history and other diagnostic information is  necessary to determine patient infection status.  Positive results do  not rule out bacterial infection or co-infection with other viruses. If result is PRESUMPTIVE POSTIVE SARS-CoV-2 nucleic acids MAY BE PRESENT.   A presumptive positive result was obtained on the submitted specimen  and confirmed on  repeat testing.  While 2019 novel coronavirus  (SARS-CoV-2) nucleic acids may be present in the submitted sample  additional confirmatory testing may be necessary for epidemiological  and / or clinical management purposes  to differentiate between  SARS-CoV-2 and other Sarbecovirus currently known to infect humans.  If clinically indicated additional testing with an alternate test  methodology [phone] is advised. The SARS-CoV-2 RNA is generally  detectable in upper and lower respiratory sp ecimens during the acute  phase of infection. The expected result is Negative. Fact Sheet for Patients:  StrictlyIdeas.no Fact Sheet for Healthcare Providers: BankingDealers.co.za This test is not yet approved or cleared by the [place] FDA and has been authorized for detection and/or diagnosis of SARS-CoV-2 by FDA under an Emergency Use Authorization (EUA).  This EUA will remain in effect (meaning this test can be used) for the duration of the COVID-19 declaration under Section 564(b)(1) of the Act, 21 U.S.C. section 360bbb-3(b)(1), unless the authorization is terminated or revoked sooner. Performed at [facility], [place] [address], [place], [place] [address]          Radiology Studies: No results found.      Scheduled Meds: . enoxaparin (LOVENOX) injection  40 mg Subcutaneous Daily  . lacosamide  200 mg Oral BID  . levETIRAcetam  1,500 mg Oral BID  . pantoprazole  40 mg Oral Daily  . phenytoin  100 mg Oral TID  . sodium chloride flush  10-40 mL Intracatheter Q12H  . topiramate  200 mg Oral BID   Continuous Infusions:    LOS: 15 days        Aline August, MD [facility] 02/19/2019, 9:48 AM

## 2019-02-19 NOTE — Progress Notes (Signed)
Physical Therapy Treatment Patient Details Name: Travis Briggs. MRN: [id number] DOB: [birth year] Today's Date: 02/19/2019    History of Present Illness 43 y/o male admitted secondary to seizure like activity at a bus stop. Per notes, pt with hx of TBI s/p craniotomy.     PT Comments    Pt provided with increased time and performs with min/guard assist for safety.  Pt perseverating on condom catheter today however able to redirect to tasks.  Pt appears happy to ambulate outside of his room and reports his desire to d/c home.   Follow Up Recommendations  SNF     Equipment Recommendations  Other (comment)(next venue)    Recommendations for Other Services       Precautions / Restrictions Precautions Precautions: Fall Precaution Comments: seizure Required Braces or Orthoses: Other Brace Other Brace: Helmet when OOB Restrictions Weight Bearing Restrictions: No    Mobility  Bed Mobility Overal bed mobility: Needs Assistance Bed Mobility: Supine to Sit;Sit to Supine     Supine to sit: Supervision Sit to supine: Supervision   General bed mobility comments: for safety  Transfers Overall transfer level: Needs assistance Equipment used: None Transfers: Sit to/from Stand Sit to Stand: Min guard         General transfer comment: min guard for safety, pt placed helmet on however unable to button strap without assist  Ambulation/Gait Ambulation/Gait assistance: Min assist;Min guard Gait Distance (Feet): 300 Feet Assistive device: None Gait Pattern/deviations: Step-through pattern;Decreased stride length;Wide base of support Gait velocity: Decreased    General Gait Details: min/guard for safety, pt held his condom catheter bag in one hand, slightly unsteady however no overt LOB observed   Stairs             Wheelchair Mobility    Modified Rankin (Stroke Patients Only)       Balance Overall balance assessment: Needs assistance Sitting-balance  support: Feet supported;No upper extremity supported Sitting balance-Leahy Scale: Good     Standing balance support: During functional activity;No upper extremity supported Standing balance-Leahy Scale: Fair Standing balance comment: static standing is fair however pt with decreased ability to tolerate moderate challenges                            Cognition Arousal/Alertness: Awake/alert Behavior During Therapy: WFL for tasks assessed/performed Overall Cognitive Status: History of cognitive impairments - at baseline                         Following Commands: Follows one step commands consistently;Follows one step commands with increased time Safety/Judgement: Decreased awareness of safety;Decreased awareness of deficits   Problem Solving: Difficulty sequencing;Requires verbal cues;Slow processing        Exercises      General Comments        Pertinent Vitals/Pain Pain Assessment: Faces Faces Pain Scale: No hurt    Home Living                      Prior Function            PT Goals (current goals can now be found in the care plan section) Acute Rehab PT Goals Patient Stated Goal: "I want to get out of here and go home" PT Goal Formulation: With patient Time For Goal Achievement: 03/05/19 Potential to Achieve Goals: Fair Progress towards PT goals: Progressing toward goals    Frequency  PT Plan Current plan remains appropriate    Co-evaluation              AM-PAC PT "6 Clicks" Mobility   Outcome Measure  Help needed turning from your back to your side while in a flat bed without using bedrails?: None Help needed moving from lying on your back to sitting on the side of a flat bed without using bedrails?: None Help needed moving to and from a bed to a chair (including a wheelchair)?: A Little Help needed standing up from a chair using your arms (e.g., wheelchair or bedside chair)?: A Little Help needed to walk  in hospital room?: A Little Help needed climbing 3-5 steps with a railing? : A Lot 6 Click Score: 19    End of Session Equipment Utilized During Treatment: Gait belt Activity Tolerance: Patient tolerated treatment well Patient left: in bed;with call bell/phone within reach;with bed alarm set Nurse Communication: Mobility status PT Visit Diagnosis: Unsteadiness on feet (R26.81);Other abnormalities of gait and mobility (R26.89)     Time: 1000-1026 PT Time Calculation (min) (ACUTE ONLY): 26 min  Charges:  $Gait Training: 23-37 mins                    Carmelia Bake, PT, DPT Acute Rehabilitation Services Office: [phone] Pager: [phone]  Trena Platt 02/19/2019, 1:19 PM

## 2019-02-20 LAB — CBC WITH DIFFERENTIAL/PLATELET
Abs Immature Granulocytes: 0.06 10*3/uL (ref 0.00–0.07)
Basophils Absolute: 0 10*3/uL (ref 0.0–0.1)
Basophils Relative: 1 %
Eosinophils Absolute: 0.2 10*3/uL (ref 0.0–0.5)
Eosinophils Relative: 3 %
HCT: 38.3 % — ABNORMAL LOW (ref 39.0–52.0)
Hemoglobin: 12.7 g/dL — ABNORMAL LOW (ref 13.0–17.0)
Immature Granulocytes: 1 %
Lymphocytes Relative: 38 %
Lymphs Abs: 2.4 10*3/uL (ref 0.7–4.0)
MCH: 30.5 pg (ref 26.0–34.0)
MCHC: 33.2 g/dL (ref 30.0–36.0)
MCV: 92.1 fL (ref 80.0–100.0)
Monocytes Absolute: 0.6 10*3/uL (ref 0.1–1.0)
Monocytes Relative: 9 %
Neutro Abs: 3.1 10*3/uL (ref 1.7–7.7)
Neutrophils Relative %: 48 %
Platelets: 411 10*3/uL — ABNORMAL HIGH (ref 150–400)
RBC: 4.16 MIL/uL — ABNORMAL LOW (ref 4.22–5.81)
RDW: 12.2 % (ref 11.5–15.5)
WBC: 6.4 10*3/uL (ref 4.0–10.5)
nRBC: 0 % (ref 0.0–0.2)

## 2019-02-20 LAB — COMPREHENSIVE METABOLIC PANEL
ALT: 211 U/L — ABNORMAL HIGH (ref 0–44)
AST: 71 U/L — ABNORMAL HIGH (ref 15–41)
Albumin: 3.5 g/dL (ref 3.5–5.0)
Alkaline Phosphatase: 100 U/L (ref 38–126)
Anion gap: 9 (ref 5–15)
BUN: 15 mg/dL (ref 6–20)
CO2: 23 mmol/L (ref 22–32)
Calcium: 9.5 mg/dL (ref 8.9–10.3)
Chloride: 107 mmol/L (ref 98–111)
Creatinine, Ser: 1.16 mg/dL (ref 0.61–1.24)
GFR calc Af Amer: >60 mL/min (ref >60)
GFR calc non Af Amer: >60 mL/min (ref >60)
Glucose, Bld: 112 mg/dL — ABNORMAL HIGH (ref 70–99)
Potassium: 4.4 mmol/L (ref 3.5–5.1)
Sodium: 139 mmol/L (ref 135–145)
Total Bilirubin: 0.3 mg/dL (ref 0.3–1.2)
Total Protein: 7.1 g/dL (ref 6.5–8.1)

## 2019-02-20 LAB — MAGNESIUM: Magnesium: 2.5 mg/dL — ABNORMAL HIGH (ref 1.7–2.4)

## 2019-02-20 NOTE — Progress Notes (Signed)
  Speech Language Pathology Treatment: Cognitive-Linquistic  Patient Details Name: Travis Briggs. MRN: [id number] DOB: 07-11-1976 Today's Date: 02/20/2019 Time: 1545-1600 SLP Time Calculation (min) (ACUTE ONLY): 15 min  Assessment / Plan / Recommendation Clinical Impression  Patient seen to address aphasia goals however patient was agitated by condom catheter and after significant cues from SLP, was able to communciate to SLP that it was leaking. Patient was verbally agitated and frustrated and appeared to be trying to communicate to SLP that he didn't like how some people wont help him. SLP tried to explain that nursing would help with his condom catheter, but SLP was not able to do so and was not able to help him to toilet. Patient continues to have a significant cognitive and linguistic impairment and requires moderate amount of cues to redirect, semantic and question cues to determine what he is trying to communicate.    HPI HPI: Pt is a 43 y.o. 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 EMS was called after bystanders observed patient having a seizure at a local bus stop. When EMS arrived they noted him having tonic-clonic seizure activity. Patient was given 5 mg of Versed IM. In ED he was noted to have right hemiparesis, right gaze preference, and a right dense aphasia with inability to speak.  MRI of the brain revealed bifrontal, left temporal and parietal encephalomalacia with no acute intracranial abnormality identified. EEG was performed and showed evidence of cerebral dysfunction in theleft frontal region with epileptogenicity.Frequent seizures were recorded from the left frontal region lasting on average 1-2 minutes with no apparent clinical signs on video.       SLP Plan  Continue with current plan of care       Recommendations   continue  with POC                Follow up Recommendations: Skilled Nursing facility SLP Visit Diagnosis: Aphasia (R47.01) Plan: Continue with current plan of care       GO                Travis Briggs 02/20/2019, 5:55 PM    Travis Baller, MA, [place] [facility] Pager: [phone]

## 2019-02-20 NOTE — Progress Notes (Signed)
Patient ID: Travis Briggs., male   DOB: 09/04/1976, 43 y.o.   MRN: [id number]  PROGRESS NOTE    [name]  JSH:[id number] DOB: July 19, 1976 DOA: 02/03/2019 PCP: System, Pcp Not In   Brief Narrative:  43 year old male with history of traumatic brain injury status post craniotomy/cranioplasty presented with seizures.  He was found to have status epilepticus.  Neurology was consulted.  He was started on antiepileptics.  Subjective:   Patient in bed, appears comfortable, denies any headache, no fever, no chest pain or pressure, no shortness of breath , no abdominal pain. No focal weakness.    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  He is currently on Vimpat, Topamax, Keppra and Dilantin combination.  Should be discharged on all 4 medications per neurology thereafter outpatient neurology follow-up in 1 to 2 weeks post discharge for gradual tapering off of seizure medications.  Ativan to be continued for agitation only and for breakthrough seizures.  Last EEG on 02/27/2019 was stable and seizure-free.  Advance activity, PT, SNF placement once bed available.   Acute encephalopathy  - Probably secondary to above.  Gradually improving.  Note patient is supposed to wear a cranial helmet when out of the bed.   Dental cyst -CT showing cystic lesion involving the right mandible which is associated with right lower premolar dental root.  This is thought to be most likely a dental cyst which could be due to radicular cyst or chronic infection. -We will need outpatient dentistry evaluation and follow-up -Treated with clindamycin  which was discontinued on 02/17/2019. -afebrile currently, post discharge requires dental surgery follow-up.    Gram-positive rod bacteremia: 1 out of 4 bottles positive  -Probably contaminant.  Probable UTI -Urine culture grew Pseudomonas.  Patient was on Rocephin and not spiking temperatures.  Pseudomonas might be colonizer.  Rocephin discontinued on 02/15/2019.  No temperature spikes since then.   DVT prophylaxis: Lovenox Code Status: Full Family Communication: None at bedside Disposition Plan: SNF once bed is available.  Patient is medically stable for discharge  Consultants: Neurology  Procedures: EEG  Antimicrobials:  Anti-infectives (From admission, onward)   Start     Dose/Rate Route Frequency Ordered Stop   02/15/19 1400  clindamycin (CLEOCIN) capsule 300 mg  Status:  Discontinued     300 mg Oral Every 8 hours 02/15/19 0856 02/17/19 0748   02/12/19 2030  cefTRIAXone (ROCEPHIN) 2 g in sodium chloride 0.9 % 100 mL IVPB  Status:  Discontinued     2 g 200 mL/hr over 30 Minutes Intravenous Every 24 hours 02/12/19 1947 02/15/19 0859   02/11/19 2200  clindamycin (CLEOCIN) IVPB 600 mg  Status:  Discontinued     600 mg 100 mL/hr over 30 Minutes Intravenous Every 8 hours 02/11/19 2051 02/15/19 0856       Objective: Vitals:   02/19/19 2022 02/20/19 0005 02/20/19 0437 02/20/19 1230  BP: 123/88 132/72 134/70 118/87  Pulse: (!) 53 62 77 (!) 58  Resp: 16 15 17 18   Temp: 98.3 F (36.8 C) 98.2 F (36.8 C) 98.4 F (36.9 C) 98 F (36.7 C)  TempSrc: Oral Oral Oral Oral  SpO2: 100% 98% 99% 100%  Weight:      Height:        Intake/Output Summary (Last 24 hours) at 02/20/2019 1354 Last data  filed at 02/20/2019 0611 Gross per 24 hour  Intake 360 ml  Output 870 ml  Net -510 ml   Filed Weights   02/03/19 1642 02/09/19 0305 02/15/19 0100  Weight: 72.6 kg 73.1 kg 73.2 kg    Examination:  General exam: No distress.  Looks older than stated age.  Awake but does not answer questions appropriately.  No seizure-like activities currently. Respiratory system: Bilateral decreased breath sounds at bases, no wheezing Cardiovascular system: S1-S2 heard,  intermittent bradycardia  gastrointestinal system: Abdomen is nondistended, soft and nontender. Normal bowel sounds heard. Extremities: No cyanosis, edema     Data Reviewed: I have personally reviewed following labs and imaging studies  CBC: Recent Labs  Lab 02/14/19 0609 02/20/19 0404  WBC 7.7 6.4  NEUTROABS 5.1 3.1  HGB 11.5* 12.7*  HCT 35.2* 38.3*  MCV 93.4 92.1  PLT 255 450*   Basic Metabolic Panel: Recent Labs  Lab 02/14/19 0609 02/20/19 0404  NA 138 139  K 3.9 4.4  CL 107 107  CO2 19* 23  GLUCOSE 118* 112*  BUN 6 15  CREATININE 0.86 1.16  CALCIUM 8.9 9.5  MG 2.1 2.5*   GFR: Estimated Creatinine Clearance: 81.3 mL/min (by C-G formula based on SCr of 1.16 mg/dL). Liver Function Tests: Recent Labs  Lab 02/20/19 0404  AST 71*  ALT 211*  ALKPHOS 100  BILITOT 0.3  PROT 7.1  ALBUMIN 3.5   No results for input(s): LIPASE, AMYLASE in the last 168 hours. No results for input(s): AMMONIA in the last 168 hours. Coagulation Profile: No results for input(s): INR, PROTIME in the last 168 hours. Cardiac Enzymes: No results for input(s): CKTOTAL, CKMB, CKMBINDEX, TROPONINI in the last 168 hours. BNP (last 3 results) No results for input(s): PROBNP in the last 8760 hours. HbA1C: No results for input(s): HGBA1C in the last 72 hours. CBG: Recent Labs  Lab 02/18/19 1640 02/18/19 2011 02/19/19 0020 02/19/19 0427 02/19/19 0825  GLUCAP 83 94 99 109* 88   Lipid Profile: No results for input(s): CHOL, HDL, LDLCALC, TRIG, CHOLHDL, LDLDIRECT in the last 72 hours. Thyroid Function Tests: No results for input(s): TSH, T4TOTAL, FREET4, T3FREE, THYROIDAB in the last 72 hours. Anemia Panel: No results for input(s): VITAMINB12, FOLATE, FERRITIN, TIBC, IRON, RETICCTPCT in the last 72 hours. Sepsis Labs: No results for input(s): PROCALCITON, LATICACIDVEN in the last 168 hours.  Recent Results (from the past 240 hour(s))  Culture, blood (routine x 2)     Status: None    Collection Time: 02/11/19 11:23 PM  Result Value Ref Range Status   Specimen Description BLOOD RIGHT ANTECUBITAL  Final   Special Requests   Final    BOTTLES DRAWN AEROBIC ONLY Blood Culture adequate volume   Culture   Final    NO GROWTH 5 DAYS Performed at [facility], [address], [place], [place] [address]    Report Status 02/17/2019 FINAL  Final  Culture, blood (routine x 2)     Status: Abnormal   Collection Time: 02/11/19 11:29 PM  Result Value Ref Range Status   Specimen Description BLOOD RIGHT ANTECUBITAL  Final   Special Requests   Final    BOTTLES DRAWN AEROBIC ONLY Blood Culture adequate volume   Culture  Setup Time   Final    GRAM POSITIVE RODS AEROBIC BOTTLE ONLY CRITICAL RESULT CALLED TO, READ BACK BY AND VERIFIED WITH: PHRMD J ORIET @0544  02/13/19 BY S GEZAHEGN    Culture (A)  Final  DIPHTHEROIDS(CORYNEBACTERIUM SPECIES) Standardized susceptibility testing for this organism is not available. Performed at [facility], [place] [address] [place], [place] [address]    Report Status 02/15/2019 FINAL  Final  Culture, Urine     Status: Abnormal   Collection Time: 02/13/19  1:16 AM  Result Value Ref Range Status   Specimen Description URINE, CLEAN CATCH  Final   Special Requests   Final    NONE Performed at [facility], [place] [address] [place], [place] [address]    Culture 40,000 COLONIES/mL PSEUDOMONAS AERUGINOSA (A)  Final   Report Status 02/15/2019 FINAL  Final   Organism ID, Bacteria PSEUDOMONAS AERUGINOSA (A)  Final      Susceptibility   Pseudomonas aeruginosa - MIC*    CEFTAZIDIME 4 SENSITIVE Sensitive     CIPROFLOXACIN <=0.25 SENSITIVE Sensitive     GENTAMICIN 2 SENSITIVE Sensitive     IMIPENEM 1 SENSITIVE Sensitive     PIP/TAZO 8 SENSITIVE Sensitive     CEFEPIME 2 SENSITIVE Sensitive     * 40,000 COLONIES/mL PSEUDOMONAS AERUGINOSA  SARS Coronavirus 2 (CEPHEID- Performed in [place] hospital lab), Hosp Order     Status: None    Collection Time: 02/17/19 12:15 PM  Result Value Ref Range Status   SARS Coronavirus 2 NEGATIVE NEGATIVE Final    Comment: (NOTE) If result is NEGATIVE SARS-CoV-2 target nucleic acids are NOT DETECTED. The SARS-CoV-2 RNA is generally detectable in upper and lower  respiratory specimens during the acute phase of infection. The lowest  concentration of SARS-CoV-2 viral copies this assay can detect is 250  copies / mL. A negative result does not preclude SARS-CoV-2 infection  and should not be used as the sole basis for treatment or other  patient management decisions.  A negative result may occur with  improper specimen collection / handling, submission of specimen other  than nasopharyngeal swab, presence of viral mutation(s) within the  areas targeted by this assay, and inadequate number of viral copies  (<250 copies / mL). A negative result must be combined with clinical  observations, patient history, and epidemiological information. If result is POSITIVE SARS-CoV-2 target nucleic acids are DETECTED. The SARS-CoV-2 RNA is generally detectable in upper and lower  respiratory specimens dur ing the acute phase of infection.  Positive  results are indicative of active infection with SARS-CoV-2.  Clinical  correlation with patient history and other diagnostic information is  necessary to determine patient infection status.  Positive results do  not rule out bacterial infection or co-infection with other viruses. If result is PRESUMPTIVE POSTIVE SARS-CoV-2 nucleic acids MAY BE PRESENT.   A presumptive positive result was obtained on the submitted specimen  and confirmed on repeat testing.  While 2019 novel coronavirus  (SARS-CoV-2) nucleic acids may be present in the submitted sample  additional confirmatory testing may be necessary for epidemiological  and / or clinical management purposes  to differentiate between  SARS-CoV-2 and other Sarbecovirus currently known to infect humans.   If clinically indicated additional testing with an alternate test  methodology [id number]) is advised. The SARS-CoV-2 RNA is generally  detectable in upper and lower respiratory sp ecimens during the acute  phase of infection. The expected result is Negative. Fact Sheet for Patients:  StrictlyIdeas.no Fact Sheet for Healthcare Providers: BankingDealers.co.za This test is not yet approved or cleared by the [place] FDA and has been authorized for detection and/or diagnosis of SARS-CoV-2 by FDA under an Emergency Use Authorization (EUA).  This EUA will remain in effect (meaning this test can be used) for the duration of the COVID-19 declaration under Section 564(b)(1) of the Act, 21 U.S.C. section 360bbb-3(b)(1), unless the authorization is terminated or revoked sooner. Performed at [facility], [place] [address] [place], [place] [address]     Radiology Studies: No results found.  Scheduled Meds: . enoxaparin (LOVENOX) injection  40 mg Subcutaneous Daily  . feeding supplement (ENSURE ENLIVE)  237 mL Oral TID BM  . lacosamide  200 mg Oral BID  . levETIRAcetam  1,500 mg Oral BID  . multivitamin with minerals  1 tablet Oral Daily  . pantoprazole  40 mg Oral Daily  . phenytoin  100 mg Oral TID  . sodium chloride flush  10-40 mL Intracatheter Q12H  . topiramate  200 mg Oral BID   Continuous Infusions:    LOS: 16 days   Signature  Lala Lund M.D on 02/20/2019 at 1:55 PM   -  To page go to [email or web address]

## 2019-02-21 LAB — CBC
HCT: 36.4 % — ABNORMAL LOW (ref 39.0–52.0)
Hemoglobin: 12 g/dL — ABNORMAL LOW (ref 13.0–17.0)
MCH: 30.7 pg (ref 26.0–34.0)
MCHC: 33 g/dL (ref 30.0–36.0)
MCV: 93.1 fL (ref 80.0–100.0)
Platelets: 407 10*3/uL — ABNORMAL HIGH (ref 150–400)
RBC: 3.91 MIL/uL — ABNORMAL LOW (ref 4.22–5.81)
RDW: 12.4 % (ref 11.5–15.5)
WBC: 6.3 10*3/uL (ref 4.0–10.5)
nRBC: 0 % (ref 0.0–0.2)

## 2019-02-21 LAB — COMPREHENSIVE METABOLIC PANEL
ALT: 182 U/L — ABNORMAL HIGH (ref 0–44)
AST: 54 U/L — ABNORMAL HIGH (ref 15–41)
Albumin: 3.5 g/dL (ref 3.5–5.0)
Alkaline Phosphatase: 93 U/L (ref 38–126)
Anion gap: 12 (ref 5–15)
BUN: 14 mg/dL (ref 6–20)
CO2: 23 mmol/L (ref 22–32)
Calcium: 9.4 mg/dL (ref 8.9–10.3)
Chloride: 103 mmol/L (ref 98–111)
Creatinine, Ser: 1.14 mg/dL (ref 0.61–1.24)
GFR calc Af Amer: >60 mL/min (ref >60)
GFR calc non Af Amer: >60 mL/min (ref >60)
Glucose, Bld: 112 mg/dL — ABNORMAL HIGH (ref 70–99)
Potassium: 4 mmol/L (ref 3.5–5.1)
Sodium: 138 mmol/L (ref 135–145)
Total Bilirubin: 0.4 mg/dL (ref 0.3–1.2)
Total Protein: 7.2 g/dL (ref 6.5–8.1)

## 2019-02-21 LAB — MAGNESIUM: Magnesium: 2.4 mg/dL (ref 1.7–2.4)

## 2019-02-21 NOTE — Progress Notes (Signed)
PROGRESS NOTE    [name]  TIR:[id number] DOB: [birth year] DOA: 02/03/2019 PCP: System, Pcp Not In   Brief Narrative:  HPI On 02/03/2019 by Dr. Brent Bulla [name] is a male of unknown age (appears to be in his 30s/early 8s) with a past medical history of traumatic brain injury and prior craniectomy and cranioplasty presenting to the hospital via EMS.  It is unknown at this time whether patient has any additional past medical history.  They were called by an observer at a local bus stop informing them that the patient was having a seizure.  Upon arrival, EMS found the patient unattended having tonic-clonic seizure activity.  The individual who called EMS from the bus stop was no longer present on EMS arrival.  He was given 5 mg Versed IM.  In the ED, patient noted to have right hemiparesis, right gaze preference, and dense aphasia with inability to speak.  He had 2 more episodes of witnessed seizure activity in the ED.  Received a total of 4 mg Ativan, 1000 mg IV Keppra, and 1450 mg of IV fosphenytoin. Patient is very somnolent and nonverbal.  No history could be obtained from him.  Interim history Presented with seizures, found to have status epilepticus.  Neurology was consulted and patient was started on antiepileptics.  Currently waiting for SNF placement.  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 -Patient has been seizure free -Continue Vimpat, Topamax, Keppra, Dilantin -We will need to follow-up with neurology once discharged in 1 to 2 weeks -Continue Ativan for agitation only or for breakthrough seizures -Last EEG on 02/09/2019 showed no seizures -PT recommended SNF placement.  Have asked that PT reevaluate patient today  Acute encephalopathy -Probably secondary to the above  -Improving -Of note, patient is supposed to wear a cranial helmet when out of bed  Dental cyst -CT showing cystic lesion involving the right mandible which is associated with right lower premolar dental root.  Thought to be most likely a dental cyst which could be due to radicular cyst or chronic infection. -Patient will need to follow-up with dentistry as an outpatient -He was treated with clindamycin through 02/17/2019 -Currently afebrile  Gram-positive rod bacteremia -1 out of 4 bottles positive-contaminant  Probable UTI -Patient urine culture grew Pseudomonas -He was on Rocephin which was discontinued on 02/15/2019 -Possibly colonizer -Currently afebrile with no complaints of urinary issues  DVT Prophylaxis Lovenox  Code Status: Full  Family Communication: None at bedside  Disposition Plan: Admitted.  Currently pending SNF placement.  He is medically stable for discharge however given his history of TBI and seizure disorder, patient cannot be safely discharged home.  Will have PT reevaluate patient today.  Consultants Neurology  Procedures  EEG  Antibiotics   Anti-infectives (From admission, onward)   Start     Dose/Rate Route Frequency Ordered Stop   02/15/19 1400  clindamycin (CLEOCIN) capsule 300 mg  Status:  Discontinued     300 mg Oral Every 8 hours 02/15/19 0856 02/17/19 0748   02/12/19 2030  cefTRIAXone (ROCEPHIN) 2 g in sodium chloride 0.9 % 100 mL IVPB  Status:  Discontinued     2 g 200 mL/hr over 30 Minutes Intravenous Every 24 hours 02/12/19 1947 02/15/19 0859   02/11/19 2200  clindamycin (CLEOCIN) IVPB 600 mg  Status:  Discontinued     600 mg 100 mL/hr over 30 Minutes Intravenous Every 8 hours 02/11/19 2051 02/15/19 9242      Subjective:   Robina Ade seen and examined today.  With no complaints this morning states he is feeling good.  Denies current chest pain, shortness of breath, abdominal pain, nausea or vomiting, diarrhea or constipation, dizziness  or headache.  Objective:   Vitals:   02/20/19 1630 02/21/19 0130 02/21/19 0443 02/21/19 0907  BP: 119/90 113/77 115/60 108/69  Pulse: (!) 58 (!) 52 (!) 55 60  Resp: 17 18 18 16   Temp: 98.4 F (36.9 C) 98.3 F (36.8 C) 98.7 F (37.1 C) 98.8 F (37.1 C)  TempSrc: Oral Oral Oral Oral  SpO2: 100% 100% 100% 100%  Weight:      Height:        Intake/Output Summary (Last 24 hours) at 02/21/2019 0955 Last data filed at 02/21/2019 0448 Gross per 24 hour  Intake 240 ml  Output 1100 ml  Net -860 ml   Filed Weights   02/03/19 1642 02/09/19 0305 02/15/19 0100  Weight: 72.6 kg 73.1 kg 73.2 kg    Exam  General: Well developed, well nourished, NAD, appears stated age  HEENT: NCAT, mucous membranes moist.   Neck: Supple  Cardiovascular: S1 S2 auscultated, RRR, no murmur  Respiratory: Diminished breath sounds however no wheezing  Abdomen: Soft, nontender, nondistended, + bowel sounds  Extremities: warm dry without cyanosis clubbing or edema  Neuro: AAOx1, nonfocal  Psych: Appropriate mood and affect   Data Reviewed: I have personally reviewed following labs and imaging studies  CBC: Recent Labs  Lab 02/20/19 0404 02/21/19 0553  WBC 6.4 6.3  NEUTROABS 3.1  --   HGB 12.7* 12.0*  HCT 38.3* 36.4*  MCV 92.1 93.1  PLT 411* 683*   Basic Metabolic Panel: Recent Labs  Lab 02/20/19 0404 02/21/19 0553  NA 139 138  K 4.4 4.0  CL 107 103  CO2 23 23  GLUCOSE 112* 112*  BUN 15 14  CREATININE 1.16 1.14  CALCIUM 9.5 9.4  MG 2.5* 2.4   GFR: Estimated Creatinine Clearance: 82.7 mL/min (by C-G formula based on SCr of 1.14 mg/dL). Liver Function Tests: Recent Labs  Lab 02/20/19 0404 02/21/19 0553  AST 71* 54*  ALT 211* 182*  ALKPHOS 100 93  BILITOT 0.3 0.4  PROT 7.1 7.2  ALBUMIN 3.5 3.5   No results for input(s): LIPASE, AMYLASE in the last 168 hours. No results for input(s): AMMONIA in the last 168 hours. Coagulation Profile: No results for input(s): INR,  PROTIME in the last 168 hours. Cardiac Enzymes: No results for input(s): CKTOTAL, CKMB, CKMBINDEX, TROPONINI in the last 168 hours. BNP (last 3 results) No results for input(s): PROBNP in the last 8760 hours. HbA1C: No results for input(s): HGBA1C in the last 72 hours. CBG: Recent Labs  Lab 02/18/19 1640 02/18/19 2011 02/19/19 0020 02/19/19 0427 02/19/19 0825  GLUCAP 83 94 99 109* 88   Lipid Profile: No results for input(s): CHOL, HDL, LDLCALC, TRIG, CHOLHDL, LDLDIRECT in the last 72 hours. Thyroid Function Tests: No results for input(s): TSH, T4TOTAL, FREET4, T3FREE, THYROIDAB in the last 72 hours. Anemia Panel: No results for input(s): VITAMINB12, FOLATE, FERRITIN, TIBC, IRON, RETICCTPCT in the last 72 hours. Urine analysis:    Component Value Date/Time   COLORURINE YELLOW 02/12/2019 0930   APPEARANCEUR HAZY (A) 02/12/2019 0930   LABSPEC 1.017 02/12/2019 0930   PHURINE 8.0 02/12/2019 0930  GLUCOSEU NEGATIVE 02/12/2019 0930   HGBUR NEGATIVE 02/12/2019 0930   BILIRUBINUR NEGATIVE 02/12/2019 0930   KETONESUR 5 (A) 02/12/2019 0930   PROTEINUR NEGATIVE 02/12/2019 0930   NITRITE POSITIVE (A) 02/12/2019 0930   LEUKOCYTESUR MODERATE (A) 02/12/2019 0930   Sepsis Labs: @LABRCNTIP (procalcitonin:4,lacticidven:4)  ) Recent Results (from the past 240 hour(s))  Culture, blood (routine x 2)     Status: None   Collection Time: 02/11/19 11:23 PM  Result Value Ref Range Status   Specimen Description BLOOD RIGHT ANTECUBITAL  Final   Special Requests   Final    BOTTLES DRAWN AEROBIC ONLY Blood Culture adequate volume   Culture   Final    NO GROWTH 5 DAYS Performed at [facility], [place] [address], [place], [place] [address]    Report Status 02/17/2019 FINAL  Final  Culture, blood (routine x 2)     Status: Abnormal   Collection Time: 02/11/19 11:29 PM  Result Value Ref Range Status   Specimen Description BLOOD RIGHT ANTECUBITAL  Final   Special Requests   Final    BOTTLES  DRAWN AEROBIC ONLY Blood Culture adequate volume   Culture  Setup Time   Final    GRAM POSITIVE RODS AEROBIC BOTTLE ONLY CRITICAL RESULT CALLED TO, READ BACK BY AND VERIFIED WITH: PHRMD J ORIET @0544  02/13/19 BY S GEZAHEGN    Culture (A)  Final    DIPHTHEROIDS(CORYNEBACTERIUM SPECIES) Standardized susceptibility testing for this organism is not available. Performed at [facility], [place] [address], [place], [place] [address]    Report Status 02/15/2019 FINAL  Final  Culture, Urine     Status: Abnormal   Collection Time: 02/13/19  1:16 AM  Result Value Ref Range Status   Specimen Description URINE, CLEAN CATCH  Final   Special Requests   Final    NONE Performed at [facility], [place] [address], [place], [place] [address]    Culture 40,000 COLONIES/mL PSEUDOMONAS AERUGINOSA (A)  Final   Report Status 02/15/2019 FINAL  Final   Organism ID, Bacteria PSEUDOMONAS AERUGINOSA (A)  Final      Susceptibility   Pseudomonas aeruginosa - MIC*    CEFTAZIDIME 4 SENSITIVE Sensitive     CIPROFLOXACIN <=0.25 SENSITIVE Sensitive     GENTAMICIN 2 SENSITIVE Sensitive     IMIPENEM 1 SENSITIVE Sensitive     PIP/TAZO 8 SENSITIVE Sensitive     CEFEPIME 2 SENSITIVE Sensitive     * 40,000 COLONIES/mL PSEUDOMONAS AERUGINOSA  SARS Coronavirus 2 (CEPHEID- Performed in [place] [facility] lab), Hosp Order     Status: None   Collection Time: 02/17/19 12:15 PM  Result Value Ref Range Status   SARS Coronavirus 2 NEGATIVE NEGATIVE Final    Comment: (NOTE) If result is NEGATIVE SARS-CoV-2 target nucleic acids are NOT DETECTED. The SARS-CoV-2 RNA is generally detectable in upper and lower  respiratory specimens during the acute phase of infection. The lowest  concentration of SARS-CoV-2 viral copies this assay can detect is 250  copies / mL. A negative result does not preclude SARS-CoV-2 infection  and should not be used as the sole basis for treatment or other  patient management decisions.  A  negative result may occur with  improper specimen collection / handling, submission of specimen other  than nasopharyngeal swab, presence of viral mutation(s) within the  areas targeted by this assay, and inadequate number of viral copies  (<250 copies / mL). A negative result must be combined with  clinical  observations, patient history, and epidemiological information. If result is POSITIVE SARS-CoV-2 target nucleic acids are DETECTED. The SARS-CoV-2 RNA is generally detectable in upper and lower  respiratory specimens dur ing the acute phase of infection.  Positive  results are indicative of active infection with SARS-CoV-2.  Clinical  correlation with patient history and other diagnostic information is  necessary to determine patient infection status.  Positive results do  not rule out bacterial infection or co-infection with other viruses. If result is PRESUMPTIVE POSTIVE SARS-CoV-2 nucleic acids MAY BE PRESENT.   A presumptive positive result was obtained on the submitted specimen  and confirmed on repeat testing.  While 2019 novel coronavirus  (SARS-CoV-2) nucleic acids may be present in the submitted sample  additional confirmatory testing may be necessary for epidemiological  and / or clinical management purposes  to differentiate between  SARS-CoV-2 and other Sarbecovirus currently known to infect humans.  If clinically indicated additional testing with an alternate test  methodology [phone]) is advised. The SARS-CoV-2 RNA is generally  detectable in upper and lower respiratory sp ecimens during the acute  phase of infection. The expected result is Negative. Fact Sheet for Patients:  StrictlyIdeas.no Fact Sheet for Healthcare Providers: BankingDealers.co.za This test is not yet approved or cleared by the [place] FDA and has been authorized for detection and/or diagnosis of SARS-CoV-2 by FDA under an Emergency Use  Authorization (EUA).  This EUA will remain in effect (meaning this test can be used) for the duration of the COVID-19 declaration under Section 564(b)(1) of the Act, 21 U.S.C. section 360bbb-3(b)(1), unless the authorization is terminated or revoked sooner. Performed at [facility], [place] [address] [place], [place] [address]       Radiology Studies: No results found.   Scheduled Meds: . enoxaparin (LOVENOX) injection  40 mg Subcutaneous Daily  . feeding supplement (ENSURE ENLIVE)  237 mL Oral TID BM  . lacosamide  200 mg Oral BID  . levETIRAcetam  1,500 mg Oral BID  . multivitamin with minerals  1 tablet Oral Daily  . pantoprazole  40 mg Oral Daily  . phenytoin  100 mg Oral TID  . sodium chloride flush  10-40 mL Intracatheter Q12H  . topiramate  200 mg Oral BID   Continuous Infusions:   LOS: 17 days   Time Spent in minutes   30 minutes  Karrina Lye D.O. on 02/21/2019 at 9:55 AM  Between 7am to 7pm - Please see pager noted on [email or web address]  After 7pm go to [email or web address]  And look for the night coverage person covering for me after hours  [facility] Office  [phone]

## 2019-02-21 NOTE — Progress Notes (Signed)
OT Cancellation Note  Patient Details Name: Travis Briggs. MRN: [id number] DOB: 01-Jul-1976   Cancelled Treatment:    Reason Eval/Treat Not Completed: Other (comment); pt eating lunch and request to keep eating, will follow up for OT treatment as schedule permits.  Lou Cal, OT Supplemental Rehabilitation Services Pager [phone] Office [place] 02/21/2019, 1:23 PM

## 2019-02-21 NOTE — Progress Notes (Signed)
Physical Therapy Treatment Patient Details Name: Travis Briggs. MRN: [id number] DOB: 27-Feb-1976 Today's Date: 02/21/2019    History of Present Illness 43 y/o male admitted secondary to seizure like activity at a bus stop. Per notes, pt with hx of TBI s/p craniotomy.     PT Comments    Pt performed gait training and functional mobility during sesson.  He is mobilizing well ambulating 200 ft with out assistance.  Pt distracted in halls and required min assist to correct LOB.  Pt presents with nonsensical conversation.  Called ALF and patient appears to be at baseline.  Will inform supervising PT of need for updated recommendations at this time.    Follow Up Recommendations  Other (comment)(ALF with HHPT)     Equipment Recommendations  None recommended by PT    Recommendations for Other Services       Precautions / Restrictions Precautions Precautions: Fall Precaution Comments: seizure Required Braces or Orthoses: Other Brace Other Brace: Helmet when OOB Restrictions Weight Bearing Restrictions: No    Mobility  Bed Mobility Overal bed mobility: Needs Assistance Bed Mobility: Supine to Sit;Sit to Supine     Supine to sit: Supervision Sit to supine: Supervision   General bed mobility comments: for safety  Transfers Overall transfer level: Needs assistance Equipment used: None Transfers: Sit to/from Stand Sit to Stand: Supervision         General transfer comment: supervision for safety.   Ambulation/Gait Ambulation/Gait assistance: Supervision;Min assist(x1 min assistance due to LOB to the R.  ) Gait Distance (Feet): 200 Feet Assistive device: None Gait Pattern/deviations: Step-through pattern;Decreased stride length Gait velocity: Decreased    General Gait Details: min/guard for safety, pt held his condom catheter bag in one hand, slightly unsteady with minor LOB.     Stairs             Wheelchair Mobility    Modified Rankin (Stroke  Patients Only)       Balance Overall balance assessment: Needs assistance   Sitting balance-Leahy Scale: Good       Standing balance-Leahy Scale: Fair                              Cognition Arousal/Alertness: Awake/alert Behavior During Therapy: WFL for tasks assessed/performed Overall Cognitive Status: History of cognitive impairments - at baseline                                 General Comments: pt with hx of TBI, able to follow 1 step commands, requires cueing for sequencing ADLs.  At time perseverates on conversations with different staff members in halls and is difficult to redirect.        Exercises      General Comments        Pertinent Vitals/Pain Pain Assessment: No/denies pain    Home Living                      Prior Function            PT Goals (current goals can now be found in the care plan section) Acute Rehab PT Goals Patient Stated Goal: "I want to get out of here and go home" Potential to Achieve Goals: Fair Progress towards PT goals: Progressing toward goals    Frequency    Min 3X/week      PT  Plan Discharge plan needs to be updated    Co-evaluation              AM-PAC PT "6 Clicks" Mobility   Outcome Measure  Help needed turning from your back to your side while in a flat bed without using bedrails?: None Help needed moving from lying on your back to sitting on the side of a flat bed without using bedrails?: None Help needed moving to and from a bed to a chair (including a wheelchair)?: A Little Help needed standing up from a chair using your arms (e.g., wheelchair or bedside chair)?: A Little Help needed to walk in hospital room?: A Little Help needed climbing 3-5 steps with a railing? : A Little 6 Click Score: 20    End of Session Equipment Utilized During Treatment: Gait belt Activity Tolerance: Patient tolerated treatment well Patient left: in bed;with call bell/phone within  reach;with bed alarm set Nurse Communication: Mobility status PT Visit Diagnosis: Unsteadiness on feet (R26.81);Other abnormalities of gait and mobility (R26.89)     Time: 4585-9292 PT Time Calculation (min) (ACUTE ONLY): 26 min  Charges:  $Gait Training: 8-22 mins $Therapeutic Activity: 8-22 mins                     Governor Rooks, PTA Acute Rehabilitation Services Pager [phone] Office [phone]     Lanyla Costello Eli Hose 02/21/2019, 3:43 PM

## 2019-02-21 NOTE — TOC Progression Note (Signed)
Transition of Care (TOC) - Progression Note    Patient Details  Name: Travis Briggs. MRN: [id number] Date of Birth: 11/20/1975  Transition of Care [facility]) CM/SW [place], [place] Phone Number: 02/21/2019, 3:44 PM  Clinical Narrative:  CSW confirmed with PT that patient has improved to be able to return to ALF with home health services. CSW reached out to [employer] to discuss patient's return; it is now too late in the day for a discharge today, the administrator has left for the day. CSW reported to Saralyn Pilar that patient is able to return to ALF and we will be reaching out tomorrow about him returning home.      Expected Discharge Plan: Assisted Living Barriers to Discharge: Continued Medical Work up, Other (comment)(pt initially doesnt know identity--old TBI)  Expected Discharge Plan and Services Expected Discharge Plan: Assisted Living       Living arrangements for the past 2 months: Assisted Living Facility                                       Social Determinants of Health (SDOH) Interventions    Readmission Risk Interventions No flowsheet data found.

## 2019-02-22 MED ORDER — SENNOSIDES-DOCUSATE SODIUM 8.6-50 MG PO TABS: 1.0000 | ORAL_TABLET | Freq: Every evening | ORAL | Status: AC | PRN

## 2019-02-22 MED ORDER — ADULT MULTIVITAMIN W/MINERALS CH: 1.0000 | ORAL_TABLET | Freq: Every day | ORAL | 0 refills | Status: AC

## 2019-02-22 MED ORDER — LACOSAMIDE 200 MG PO TABS: 200.0000 mg | ORAL_TABLET | Freq: Two times a day (BID) | ORAL | 0 refills | Status: AC

## 2019-02-22 MED ORDER — LEVETIRACETAM 750 MG PO TABS: 1500.0000 mg | ORAL_TABLET | Freq: Two times a day (BID) | ORAL | 0 refills | Status: AC

## 2019-02-22 MED ORDER — ENSURE ENLIVE PO LIQD: 1.0000 | Freq: Three times a day (TID) | ORAL | 0 refills | Status: AC

## 2019-02-22 MED ORDER — TOPIRAMATE 200 MG PO TABS: 200.0000 mg | ORAL_TABLET | Freq: Two times a day (BID) | ORAL | 0 refills | Status: DC

## 2019-02-22 NOTE — Discharge Instructions (Signed)
Seizure, Adult When you have a seizure:  Parts of your body may move.  You may have a change in how aware or awake (conscious) you are.  You may shake (convulse). Seizures usually last from 30 seconds to 2 minutes. Usually, they are not harmful unless they last a long time. What are the signs or symptoms? Common symptoms of this condition include:  Shaking (convulsions).  Stiffness in the body.  Passing out (losing consciousness).  Uncontrolled movements in the: ? Arms or legs. ? Eyes. ? Head. ? Mouth. Some people have symptoms right before a seizure happens. These symptoms may include:  Fear.  Worry (anxiety).  Feeling like you are going to throw up (nausea).  Feeling like the room is spinning (vertigo).  Feeling like you saw or heard something before (dj vu).  Odd tastes or smells.  Changes in vision, such as seeing flashing lights or spots. Follow these instructions at home: Medicines   Take over-the-counter and prescription medicines only as told by your doctor.  Do not eat or drink anything that may keep your medicine from working, such as alcohol. Activity  Do not do any activities that would be dangerous if you had another seizure, like driving or swimming. Wait until your doctor says it is safe for you to do them.  If you live in the [place] ask your local DMV (department of motor vehicles) when you can drive.  Get plenty of rest. Teaching others   Teach friends and family what to do when you have a seizure. They should: ? Lay you on the ground. ? Protect your head and body. ? Loosen any tight clothing around your neck. ? Turn you on your side. ? Not hold you down. ? Not put anything into your mouth. ? Know whether or not you need emergency care. ? Stay with you until you are better. General instructions  Contact your doctor each time you have a seizure.  Avoid anything that gives you seizures.  Keep a seizure diary. Write down: ? What  you think caused each seizure. ? What you remember about each seizure.  Keep all follow-up visits as told by your doctor. This is important. Contact a doctor if:  You have another seizure.  You have seizures more often.  There is any change in what happens during your seizures.  You keep having seizures with treatment.  You have symptoms of being sick or having an infection. Get help right away if:  You have a seizure: ? That lasts longer than 5 minutes. ? That is different than seizures you had before. ? That makes it harder to breathe. ? After you hurt your head.  After a seizure, you cannot speak or use a part of your body.  After a seizure, you are confused or have a bad headache.  You have two or more seizures in a row.  You are having seizures more often.  You do not wake up right after a seizure.  You get hurt during a seizure. In an emergency:  These symptoms may be an emergency. Do not wait to see if the symptoms will go away. Get medical help right away. Call your local emergency services (911 in the [place] Do not drive yourself to the hospital. Summary  Seizures usually last from 30 seconds to 2 minutes. Usually, they are not harmful unless they last a long time.  Do not eat or drink anything that may keep your medicine from working, such as alcohol.  Teach friends and family what to do when you have a seizure.  Contact your doctor each time you have a seizure. This information is not intended to replace advice given to you by your health care provider. Make sure you discuss any questions you have with your health care provider. Document Released: 03/14/2008 Document Revised: 06/20/2018 Document Reviewed: 11/02/2017 Elsevier Interactive Patient Education  2019 [employer].

## 2019-02-22 NOTE — Plan of Care (Signed)
Adequate for discharge.

## 2019-02-22 NOTE — Progress Notes (Signed)
Patient discharged.

## 2019-02-22 NOTE — NC FL2 (Addendum)
[place] LEVEL OF CARE SCREENING TOOL     IDENTIFICATION  Patient Name: Travis Briggs. Birthdate: 03-14-1976 Sex: male Admission Date (Current Location): 02/03/2019  [facility] and [place] Number:  Herbalist and Address:  [facility], [place] [address] [place], [place] [address]      Provider Number: [id number]  Attending Physician Name and Address:  Cristal Ford, DO  Relative Name and Phone Number:  Charlann Noss, [phone]    Current Level of Care: Hospital Recommended Level of Care: [place] Prior Approval Number:    Date Approved/Denied:   PASRR Number: [id number]  Discharge Plan: Home(Back to his assisted living facility )    Current Diagnoses: Patient Active Problem List   Diagnosis Date Noted  . Thrombocytopenia ([place]) 02/08/2019  . Status epilepticus ([place]) 02/04/2019  . Dental cyst 02/04/2019  . 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    Orientation RESPIRATION BLADDER Height & Weight     Self  Normal Incontinent, External catheter Weight: 161 lb 6 oz (73.2 kg) Height:  5' 9"  (175.3 cm)  BEHAVIORAL SYMPTOMS/MOOD NEUROLOGICAL BOWEL NUTRITION STATUS  [place] Convulsions/Seizures Continent Diet (Mechanical soft, thin liquids)  AMBULATORY STATUS COMMUNICATION OF NEEDS Skin   Supervision(able to walk 300 feet min 1 assist) Verbally Normal                       Personal Care Assistance Level of Assistance  Bathing, Feeding, Dressing, Total care Bathing Assistance: Limited assistance(Min assist ) Feeding assistance: Independent(Needs help to set up) Dressing Assistance: Limited assistance(Min assist) Total Care Assistance: Limited assistance(Min assist)   Functional Limitations Info  Sight, Hearing, Speech Sight Info: Adequate Hearing Info: Adequate Speech Info: Adequate    SPECIAL CARE FACTORS FREQUENCY  PT (By licensed PT), OT (By licensed OT)     PT Frequency:  3x/wk OT Frequency: 3x/wk     Speech Therapy Frequency: 5x/wk      Contractures Contractures Info: Not present    Additional Factors Info  Code Status, Allergies Code Status Info: Full Code Allergies Info: No Known allergies           Current Medications (02/22/2019):  This is the current hospital active medication list Current Facility-Administered Medications  Medication Dose Route Frequency Provider Last Rate Last Dose  . acetaminophen (TYLENOL) tablet 650 mg  650 mg Oral Q6H PRN Bodenheimer, Charles A, NP   650 mg at 02/15/19 1053   Or  . acetaminophen (TYLENOL) suppository 650 mg  650 mg Rectal Q6H PRN Vertis Kelch, NP   650 mg at 02/11/19 2010  . enoxaparin (LOVENOX) injection 40 mg  40 mg Subcutaneous Daily Aline August, MD   40 mg at 02/22/19 0838  . feeding supplement (ENSURE ENLIVE) (ENSURE ENLIVE) liquid 237 mL  237 mL Oral TID BM Starla Link, Kshitiz, MD   237 mL at 02/22/19 0839  . lacosamide (VIMPAT) tablet 200 mg  200 mg Oral BID Aline August, MD   200 mg at 02/22/19 0839  . levETIRAcetam (KEPPRA) tablet 1,500 mg  1,500 mg Oral BID Aline August, MD   1,500 mg at 02/22/19 0838  . LORazepam (ATIVAN) injection 0.5 mg  0.5 mg Intravenous Q4H PRN Aline August, MD   0.5 mg at 02/21/19 0245  . metoCLOPramide (REGLAN) injection 5 mg  5 mg Intravenous Once PRN Elodia Florence., MD      . multivitamin with minerals tablet 1 tablet  1 tablet Oral Daily Aline August, MD   1 tablet at 02/22/19 [phone]  . ondansetron (ZOFRAN) injection 4 mg  4 mg Intravenous Q6H PRN Shela Leff, MD   4 mg at 02/04/19 1232  . pantoprazole (PROTONIX) EC tablet 40 mg  40 mg Oral Daily Elodia Florence., MD   40 mg at 02/22/19 0837  . phenytoin (DILANTIN) chewable tablet 100 mg  100 mg Oral TID Aline August, MD   100 mg at 02/22/19 0839  . senna-docusate (Senokot-S) tablet 1 tablet  1 tablet Oral QHS PRN Kerney Elbe, MD   1 tablet at 02/20/19 2028  . sodium chloride  flush (NS) 0.9 % injection 10-40 mL  10-40 mL Intracatheter Q12H Starla Link, Kshitiz, MD   10 mL at 02/22/19 0840  . sodium chloride flush (NS) 0.9 % injection 10-40 mL  10-40 mL Intracatheter PRN Aline August, MD      . topiramate (TOPAMAX) tablet 200 mg  200 mg Oral BID Aroor, Lanice Schwab, MD   200 mg at 02/22/19 5188     Discharge Medications: Please see discharge summary for a list of discharge medications.  Relevant Imaging Results:  Relevant Lab Results:   Additional Information SS#: [id number]  Philippa Chester Tamsin Nader, LCSWA

## 2019-02-22 NOTE — Progress Notes (Signed)
Gave report to [facility], PTAR present and will transport to room 303

## 2019-02-22 NOTE — TOC Transition Note (Signed)
Transition of Care [facility]) - CM/SW Discharge Note   Patient Details  Name: Travis Briggs. MRN: [id number] Date of Birth: June 10, 1976  [facility]) CM/SW Contact:  Gelene Mink, [place] Phone Number: 02/22/2019, 12:02 PM   Clinical Narrative:     Patient will DC to: [facility] Anticipated DC date: 02/22/2019 Family notified: Yes Transport by: Corey Harold   Per MD patient ready for DC to . RN, patient, patient's family, and facility notified of DC. Discharge Summary and FL2 sent to facility. RN to call report prior to discharge [phone]), ask for [facility]. Patient will report to room 303. DC packet on chart. Ambulance transport requested for patient.   CSW will sign off for now as social work intervention is no longer needed. Please consult [place] again if new needs arise.  Josephus Harriger, LCSW-A [place]/Clinical Social Work Department Cell: [phone]    Final next level of care: Assisted Living Barriers to Discharge: No Barriers Identified   Patient Goals and CMS Choice Patient states their goals for this hospitalization and ongoing recovery are:: Pt will go back to his ALF CMS Medicare.gov Compare Post Acute Care list provided to:: Other (Comment Required)(NA) Choice offered to / list presented to : NA  Discharge Placement              Patient chooses bed at: Other - please specify in the comment section below:([facility]) Patient to be transferred to facility by: [facility] Name of family member notified: [name] Patient and family notified of of transfer: 02/22/19  Discharge Plan and Services                DME Arranged: N/A DME Agency: NA       HH Arranged: NA HH Agency: NA        Social Determinants of Health (SDOH) Interventions     Readmission Risk Interventions No flowsheet data found.

## 2019-02-22 NOTE — Discharge Summary (Signed)
Physician Discharge Summary  Travis Briggs Travis Briggs. CBJ:[id number] DOB: [birth year] DOA: 02/03/2019  PCP: System, Pcp Not In  Admit date: 02/03/2019 Discharge date: 02/22/2019  Time spent: 45 minutes  Recommendations for Outpatient Follow-up:  Patient will be discharged to assisted living facility with home health physical, occupational, and speech therapy.  Patient will need to follow up with primary care provider within one week of discharge.  Patient will need to follow up with neurology.  Follow up with dentistry.  Patient should continue medications as prescribed.  Patient should follow a dysphagia 3 diet.   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 Brent Bulla D Doeis amaleofunknown age (appears to be in his 30s/early 40s)witha past medical history of traumatic brain injury and prior craniectomy and cranioplastypresenting to the hospital via EMS.It is unknown at this time whether patient has any additional past medical history. They were called by an observer at a local bus stop informing them that the patient was having a seizure. Upon arrival, EMS found the patient unattended having tonic-clonic seizure activity. The individual who called EMS from the bus stop was no longer present on EMS arrival. He was given 5 mg Versed IM. In the ED, patient noted to have right hemiparesis, right gaze preference, and dense aphasia with inability to speak. He had 2 more episodes of witnessed seizure activity in the ED. Received a total of 4 mg Ativan, 1000 mg IV Keppra, and 1450 mg of IV fosphenytoin.Patient is very  somnolent and nonverbal. No history could be obtained from him.  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 -Patient has been seizure free -Continue Vimpat, Topamax, Keppra, Dilantin -We will need to follow-up with neurology once discharged in 1 to 2 weeks -Continue Ativan for agitation only or for breakthrough seizures -Last EEG on 02/09/2019 showed no seizures -PT re-evaluated patient and now recommending HH  Acute encephalopathy -Probably secondary to the above -Improving -Of note, patient is supposed to wear a cranial helmet when out of bed  Dental cyst -CT showing cystic lesion involving the right mandible which is associated with right lower premolar dental root.  Thought to be most likely a dental cyst which could be due to radicular cyst or chronic infection. -Patient will need to follow-up with dentistry as an outpatient -He was treated with clindamycin through 02/17/2019 -Currently afebrile  Gram-positive rod bacteremia -1 out of 4 bottles positive-contaminant  Probable UTI -Patient urine culture grew Pseudomonas -He was on Rocephin which was discontinued on 02/15/2019 -Possibly colonizer -Currently afebrile with no complaints of urinary issues  Consultants Neurology  Procedures  EEG  Discharge Exam: Vitals:   02/21/19 2347 02/22/19 0343  BP: 104/80 113/83  Pulse: 63 (!) 58  Resp: 18 17  Temp: 98.7 F (37.1 C) 98.5 F (36.9 C)  SpO2: 100% 100%     General: Well developed, well nourished, NAD, appears stated age  HEENT: NCAT, mucous membranes moist.  Cardiovascular: S1 S2 auscultated, RRR  Respiratory: Clear to auscultation bilaterally  Abdomen: Soft, nontender, nondistended, + bowel sounds  Extremities: warm dry without cyanosis clubbing or edema  Neuro: AAOx1, able to  move extremities with ease, aphasia  Discharge Instructions Discharge Instructions    Ambulatory referral to Neurology   Complete by:  As directed    An appointment is requested in approximately: 2 weeks, seizure follow up   Discharge instructions   Complete by:  As directed    Patient will be discharged to assisted living facility with home health physical, occupational, and speech therapy.  Patient will need to follow up with primary care provider within one week of discharge.  Patient will need to follow up with neurology. Patient should continue medications as prescribed.  Patient should follow a dysphagia 3 diet.   Per [facility] statutes, patients with seizures are not allowed to drive until they have been seizure-free for six months. Use caution when using heavy equipment or power tools. Avoid working on ladders or at heights. Take showers instead of baths. Ensure the water temperature is not too high on the home water heater. Do not go swimming alone. When caring for infants or small children, sit down when holding, feeding, or changing them to minimize risk of injury to the child in the event you have a seizure.     Allergies as of 02/22/2019   No Known Allergies     Medication List    STOP taking these medications   LORazepam 2 MG/ML injection Commonly known as:  ATIVAN   metoprolol tartrate 25 MG tablet Commonly known as:  LOPRESSOR     TAKE these medications   acamprosate 333 MG tablet Commonly known as:  CAMPRAL Take 666 mg by mouth 3 (three) times daily with meals.   acetaminophen 325 MG tablet Commonly known as:  TYLENOL Take 650 mg by mouth every 4 (four) hours as needed for mild pain.   atorvastatin 40 MG tablet Commonly known as:  LIPITOR Take 40 mg by mouth at bedtime.   feeding supplement (ENSURE ENLIVE) Liqd Take 237 mLs by mouth 3 (three) times daily between meals for 30 days.   FeroSul 325 (65 FE) MG tablet Generic drug:  ferrous  sulfate Take 325 mg by mouth 2 (two) times daily with a meal.   lacosamide 200 MG Tabs tablet Commonly known as:  VIMPAT Take 1 tablet (200 mg total) by mouth 2 (two) times daily for 30 days.   levETIRAcetam 750 MG tablet Commonly known as:  KEPPRA Take 2 tablets (1,500 mg total) by mouth 2 (two) times daily for 30 days. What changed:    medication strength  how much to take   multivitamin with minerals Tabs tablet Take 1 tablet by mouth daily.   naltrexone 50 MG tablet Commonly known as:  DEPADE Take 50 mg by mouth 2 (two) times a day.   phenytoin 100 MG ER capsule Commonly known as:  DILANTIN Take 100 mg by mouth 3 (three) times daily.   polyethylene glycol 17 g packet Commonly known as:  MIRALAX / GLYCOLAX Take 17 g by mouth daily.   QUEtiapine 100 MG tablet Commonly known as:  SEROQUEL Take 100 mg by mouth 2 (two) times daily.   senna-docusate 8.6-50 MG tablet Commonly known as:  Senokot-S Take 1 tablet by mouth at bedtime as needed for mild constipation.   sertraline 100 MG tablet Commonly known as:  ZOLOFT Take 100 mg by mouth daily.   Stool Softener 100 MG capsule Generic drug:  docusate  sodium Take 100 mg by mouth 2 (two) times daily.   topiramate 200 MG tablet Commonly known as:  TOPAMAX Take 1 tablet (200 mg total) by mouth 2 (two) times daily for 30 days.      No Known Allergies Follow-up Information    Primary care [facility]. Schedule an appointment as soon as possible for a visit in 1 week(s).   Why:  Hospital follow up       [facility]. Schedule an appointment as soon as possible for a visit in 2 week(s).   Specialty:  Neurology Why:  Hospital follow up Contact information: [address] [place] [phone]           The results of significant diagnostics from this hospitalization (including imaging, microbiology, ancillary and laboratory) are listed below for reference.     Significant Diagnostic Studies: Ct Angio Head W Or Wo Contrast  Result Date: 02/03/2019 CLINICAL DATA:  Seizure EXAM: CT ANGIOGRAPHY HEAD AND NECK TECHNIQUE: Multidetector CT imaging of the head and neck was performed using the standard protocol during bolus administration of intravenous contrast. Multiplanar CT image reconstructions and MIPs were obtained to evaluate the vascular anatomy. Carotid stenosis measurements (when applicable) are obtained utilizing NASCET criteria, using the distal internal carotid diameter as the denominator. CONTRAST:  12m OMNIPAQUE IOHEXOL 350 MG/ML SOLN COMPARISON:  None. FINDINGS: CT HEAD FINDINGS Brain: Chronic encephalomalacia in the left cerebral hemisphere with areas of hypodensity in the left temporal lobe, anterior frontal lobe on the left, and left parietal lobe. Compensatory dilatation of the left lateral ventricle. Hypodensity also in the right anterior frontal lobe. Findings are most consistent with prior head trauma. Ventricles are mildly dilated diffusely however this may be baseline. Negative for acute hemorrhage.  No mass or acute infarct. Vascular: Negative for hyperdense vessel Skull: Extensive craniectomy and cranioplasty on the left. No acute skeletal abnormality. Sinuses: Mild mucosal edema in the paranasal sinuses. Orbits: No orbital lesion identified. Review of the MIP images confirms the above findings CTA NECK FINDINGS Aortic arch: Standard branching. Imaged portion shows no evidence of aneurysm or dissection. No significant stenosis of the major arch vessel origins. Right carotid system: Normal right carotid system. No stenosis or irregularity Left carotid system: Normal left carotid system. No stenosis or irregularity. Vertebral arteries: Left vertebral artery dominant. Both vertebral arteries are relatively small but patent to the basilar. This is consistent with fetal origin of the posterior cerebral artery bilaterally. Skeleton: Cystic lesion of  the mandible on the right. Well-defined sclerotic margins medially and bone thinning or destruction laterally. Internal cystic components bulge laterally and this is likely palpable. This may be associated with right lower pre molar tooth root. Floating molars in the mandible on the right with extensive caries. No fracture. Other neck: No soft tissue mass or adenopathy. Upper chest: Negative Review of the MIP images confirms the above findings CTA HEAD FINDINGS Anterior circulation: Cavernous carotid widely patent and normal bilaterally. Anterior and middle cerebral arteries widely patent bilaterally without stenosis or vascular malformation. Posterior circulation: Both vertebral arteries patent to the basilar. PICA patent bilaterally. Basilar widely patent. Superior cerebellar arteries widely patent. Fetal origin of the posterior cerebral arteries bilaterally which are widely patent. Venous sinuses: Normal venous enhancement. Anatomic variants: None Delayed phase: No enhancing lesions postcontrast administration Review of the MIP images confirms the above findings IMPRESSION: 1. 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 Encephalomalacia anterior frontal lobes bilaterally. Encephalomalacia left frontal lobe and  left parietal lobe. Ventricular enlargement left greater than right due to chronic volume loss. Prior craniectomy and cranioplasty on the left. No acute intracranial abnormality. 2. Negative CTA head neck. No arterial stenosis or vascular malformation. 3. Cystic lesion involving the right mandible. The lesion is expansile with sclerotic margins medially. This is likely palpable. This is associated with right lower pre molar dental root. This is most likely a dental cyst which could be due to a radicular cyst or chronic infection. Electronically Signed   By: Franchot Gallo M.D.   On: 02/03/2019 18:36   Dg Abd 1 View  Result Date: 02/04/2019 CLINICAL DATA:  Male patient with nausea and  vomiting EXAM: ABDOMEN - 1 VIEW COMPARISON:  02/04/2019 FINDINGS: Gas within stomach, small bowel, colon with no abnormal distention. No unexpected radiopaque foreign body. No unexpected soft tissue density. No unexpected calcification. No displaced fracture. IMPRESSION: Nonobstructive bowel gas pattern. Electronically Signed   By: Corrie Mckusick D.O.   On: 02/04/2019 21:24   Ct Angio Neck W And/or Wo Contrast  Result Date: 02/03/2019 CLINICAL DATA:  Seizure EXAM: CT ANGIOGRAPHY HEAD AND NECK TECHNIQUE: Multidetector CT imaging of the head and neck was performed using the standard protocol during bolus administration of intravenous contrast. Multiplanar CT image reconstructions and MIPs were obtained to evaluate the vascular anatomy. Carotid stenosis measurements (when applicable) are obtained utilizing NASCET criteria, using the distal internal carotid diameter as the denominator. CONTRAST:  36m OMNIPAQUE IOHEXOL 350 MG/ML SOLN COMPARISON:  None. FINDINGS: CT HEAD FINDINGS Brain: Chronic encephalomalacia in the left cerebral hemisphere with areas of hypodensity in the left temporal lobe, anterior frontal lobe on the left, and left parietal lobe. Compensatory dilatation of the left lateral ventricle. Hypodensity also in the right anterior frontal lobe. Findings are most consistent with prior head trauma. Ventricles are mildly dilated diffusely however this may be baseline. Negative for acute hemorrhage.  No mass or acute infarct. Vascular: Negative for hyperdense vessel Skull: Extensive craniectomy and cranioplasty on the left. No acute skeletal abnormality. Sinuses: Mild mucosal edema in the paranasal sinuses. Orbits: No orbital lesion identified. Review of the MIP images confirms the above findings CTA NECK FINDINGS Aortic arch: Standard branching. Imaged portion shows no evidence of aneurysm or dissection. No significant stenosis of the major arch vessel origins. Right carotid system: Normal right carotid  system. No stenosis or irregularity Left carotid system: Normal left carotid system. No stenosis or irregularity. Vertebral arteries: Left vertebral artery dominant. Both vertebral arteries are relatively small but patent to the basilar. This is consistent with fetal origin of the posterior cerebral artery bilaterally. Skeleton: Cystic lesion of the mandible on the right. Well-defined sclerotic margins medially and bone thinning or destruction laterally. Internal cystic components bulge laterally and this is likely palpable. This may be associated with right lower pre molar tooth root. Floating molars in the mandible on the right with extensive caries. No fracture. Other neck: No soft tissue mass or adenopathy. Upper chest: Negative Review of the MIP images confirms the above findings CTA HEAD FINDINGS Anterior circulation: Cavernous carotid widely patent and normal bilaterally. Anterior and middle cerebral arteries widely patent bilaterally without stenosis or vascular malformation. Posterior circulation: Both vertebral arteries patent to the basilar. PICA patent bilaterally. Basilar widely patent. Superior cerebellar arteries widely patent. Fetal origin of the posterior cerebral arteries bilaterally which are widely patent. Venous sinuses: Normal venous enhancement. Anatomic variants: None Delayed phase: No enhancing lesions postcontrast administration Review of the MIP images confirms the  above findings IMPRESSION: 1. 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 Encephalomalacia anterior frontal lobes bilaterally. Encephalomalacia left frontal lobe and left parietal lobe. Ventricular enlargement left greater than right due to chronic volume loss. Prior craniectomy and cranioplasty on the left. No acute intracranial abnormality. 2. Negative CTA head neck. No arterial stenosis or vascular malformation. 3. Cystic lesion involving the right mandible. The lesion is expansile with sclerotic margins  medially. This is likely palpable. This is associated with right lower pre molar dental root. This is most likely a dental cyst which could be due to a radicular cyst or chronic infection. Electronically Signed   By: Franchot Gallo M.D.   On: 02/03/2019 18:36   Mr Brain Wo Contrast  Result Date: 02/04/2019 CLINICAL DATA:  [name] with tonic clonic seizure. History of prior traumatic brain injury, head CT and CTA head and neck earlier tonight at [place]: MRI HEAD WITHOUT CONTRAST TECHNIQUE: Multiplanar, multiecho pulse sequences of the brain and surrounding structures were obtained without intravenous contrast. COMPARISON:  [facility] head CT and CTA earlier tonight. FINDINGS: The examination had to be discontinued prior to completion due to vomiting, agitation. Coronal T2 and axial T1 weighted imaging not obtained. Axial T2, FLAIR and SWI degraded by motion. Brain: Ex vacuo ventricular enlargement. No intracranial mass effect or midline shift. No restricted diffusion or evidence of acute infarction. No diffusion abnormality in the hippocampal formations. Anterior bifrontal encephalomalacia. Fairly extensive left temporal lobe encephalomalacia. Left parietal lobe encephalomalacia. SWI suggests some chronic hemosiderin along the left convexity but is substantially motion degraded. Brainstem volume loss. Cerebellum appears relatively preserved. Punctate nonspecific T2 hyperintensity at the right globus pallidus. Negative pituitary and cervicomedullary junction. Vascular: Major intracranial vascular flow voids appear preserved. Skull and upper cervical spine: Negative visible cervical spine. Prior craniotomy. Visualized bone marrow signal is within normal limits. Sinuses/Orbits: Negative orbits. Paranasal sinus mucosal thickening and left maxillary retention cysts. Other: Mastoids are clear.  Scalp soft tissue scarring. IMPRESSION: 1. Intermittently degraded by motion and  discontinued prior to completion due to vomiting, agitation. 2. Bifrontal, left temporal and parietal encephalomalacia with no acute intracranial abnormality identified. Electronically Signed   By: Genevie Ann M.D.   On: 02/04/2019 02:40   Ct C-spine No Charge  Result Date: 02/03/2019 CLINICAL DATA:  Seizure.  Neck trauma EXAM: CT CERVICAL SPINE WITHOUT CONTRAST TECHNIQUE: Multidetector CT imaging of the cervical spine was performed without intravenous contrast. Multiplanar CT image reconstructions were also generated. COMPARISON:  None. FINDINGS: Alignment: Normal Skull base and vertebrae: Negative for fracture Soft tissues and spinal canal: Negative Disc levels:  No significant degenerative change Upper chest: Negative Other: None IMPRESSION: Negative CT cervical spine. Electronically Signed   By: Franchot Gallo M.D.   On: 02/03/2019 18:41   Dg Chest Port 1 View  Result Date: 02/11/2019 CLINICAL DATA:  [place]. EXAM: PORTABLE CHEST 1 VIEW COMPARISON:  Radiograph of February 04, 2019. FINDINGS: The heart size and mediastinal contours are within normal limits. Both lungs are clear. No pneumothorax or pleural effusion is noted. Old left rib fractures are noted. IMPRESSION: No active disease. Electronically Signed   By: Marijo Conception M.D.   On: 02/11/2019 15:39   Dg Chest Port 1 View  Result Date: 02/04/2019 CLINICAL DATA:  [name] with seizure. History of prior traumatic brain injury, head CT CTA head and neck earlier tonight at [place]: PORTABLE CHEST 1 VIEW COMPARISON:  Abdomen radiographs reported separately. FINDINGS: Portable AP upright view at 0137 hours. Low normal lung volumes. Normal cardiac size and mediastinal contours. Visualized tracheal air column is within normal limits. Chronic lateral left sixth through 8th rib fractures. Allowing for portable technique the lungs are clear. Negative visible bowel gas pattern. No acute osseous abnormality identified. IMPRESSION: 1.   No acute cardiopulmonary abnormality. 2. Chronic left lateral rib fractures. Electronically Signed   By: Genevie Ann M.D.   On: 02/04/2019 02:09   Dg Abd 2 Views  Result Date: 02/04/2019 CLINICAL DATA:  [name] with seizure. History of prior traumatic brain injury, head CT CTA head and neck earlier tonight at [place]: ABDOMEN - 2 VIEW COMPARISON:  None. FINDINGS: Upright and supine views. Excreted IV contrast in the urinary bladder. No pneumoperitoneum. Non obstructed bowel gas pattern. Chronic appearing posterior left 6th through 8th rib fractures. No acute osseous abnormality identified. Normal mediastinal contour. Negative visible lungs. IMPRESSION: 1. Normal bowel gas pattern, no free air. 2. Excreted IV contrast in the urinary bladder from CTA earlier tonight at [facility]. Electronically Signed   By: Genevie Ann M.D.   On: 02/04/2019 02:08    Microbiology: Recent Results (from the past 240 hour(s))  Culture, Urine     Status: Abnormal   Collection Time: 02/13/19  1:16 AM  Result Value Ref Range Status   Specimen Description URINE, CLEAN CATCH  Final   Special Requests   Final    NONE Performed at [facility], [address], [place], [place] [address]    Culture 40,000 COLONIES/mL PSEUDOMONAS AERUGINOSA (A)  Final   Report Status 02/15/2019 FINAL  Final   Organism ID, Bacteria PSEUDOMONAS AERUGINOSA (A)  Final      Susceptibility   Pseudomonas aeruginosa - MIC*    CEFTAZIDIME 4 SENSITIVE Sensitive     CIPROFLOXACIN <=0.25 SENSITIVE Sensitive     GENTAMICIN 2 SENSITIVE Sensitive     IMIPENEM 1 SENSITIVE Sensitive     PIP/TAZO 8 SENSITIVE Sensitive     CEFEPIME 2 SENSITIVE Sensitive     * 40,000 COLONIES/mL PSEUDOMONAS AERUGINOSA  SARS Coronavirus 2 (CEPHEID- Performed in [place] [facility] lab), Hosp Order     Status: None   Collection Time: 02/17/19 12:15 PM  Result Value Ref Range Status   SARS Coronavirus 2 NEGATIVE NEGATIVE Final     Comment: (NOTE) If result is NEGATIVE SARS-CoV-2 target nucleic acids are NOT DETECTED. The SARS-CoV-2 RNA is generally detectable in upper and lower  respiratory specimens during the acute phase of infection. The lowest  concentration of SARS-CoV-2 viral copies this assay can detect is 250  copies / mL. A negative result does not preclude SARS-CoV-2 infection  and should not be used as the sole basis for treatment or other  patient management decisions.  A negative result may occur with  improper specimen collection / handling, submission of specimen other  than nasopharyngeal swab, presence of viral mutation(s) within the  areas targeted by this assay, and inadequate number of viral copies  (<250 copies / mL). A negative result must be combined with clinical  observations, patient history, and epidemiological information. If result is POSITIVE SARS-CoV-2 target nucleic acids are DETECTED. The SARS-CoV-2 RNA is generally detectable in upper and lower  respiratory specimens dur ing the acute phase of infection.  Positive  results are indicative of active infection with SARS-CoV-2.  Clinical  correlation with patient history and other  diagnostic information is  necessary to determine patient infection status.  Positive results do  not rule out bacterial infection or co-infection with other viruses. If result is PRESUMPTIVE POSTIVE SARS-CoV-2 nucleic acids MAY BE PRESENT.   A presumptive positive result was obtained on the submitted specimen  and confirmed on repeat testing.  While 2019 novel coronavirus  (SARS-CoV-2) nucleic acids may be present in the submitted sample  additional confirmatory testing may be necessary for epidemiological  and / or clinical management purposes  to differentiate between  SARS-CoV-2 and other Sarbecovirus currently known to infect humans.  If clinically indicated additional testing with an alternate test  methodology [phone]) is advised. The SARS-CoV-2  RNA is generally  detectable in upper and lower respiratory sp ecimens during the acute  phase of infection. The expected result is Negative. Fact Sheet for Patients:  StrictlyIdeas.no Fact Sheet for Healthcare Providers: BankingDealers.co.za This test is not yet approved or cleared by the [place] FDA and has been authorized for detection and/or diagnosis of SARS-CoV-2 by FDA under an Emergency Use Authorization (EUA).  This EUA will remain in effect (meaning this test can be used) for the duration of the COVID-19 declaration under Section 564(b)(1) of the Act, 21 U.S.C. section 360bbb-3(b)(1), unless the authorization is terminated or revoked sooner. Performed at [facility], [place] [address] [place], [place] [address]      Labs: Basic Metabolic Panel: Recent Labs  Lab 02/20/19 0404 02/21/19 0553  NA 139 138  K 4.4 4.0  CL 107 103  CO2 23 23  GLUCOSE 112* 112*  BUN 15 14  CREATININE 1.16 1.14  CALCIUM 9.5 9.4  MG 2.5* 2.4   Liver Function Tests: Recent Labs  Lab 02/20/19 0404 02/21/19 0553  AST 71* 54*  ALT 211* 182*  ALKPHOS 100 93  BILITOT 0.3 0.4  PROT 7.1 7.2  ALBUMIN 3.5 3.5   No results for input(s): LIPASE, AMYLASE in the last 168 hours. No results for input(s): AMMONIA in the last 168 hours. CBC: Recent Labs  Lab 02/20/19 0404 02/21/19 0553  WBC 6.4 6.3  NEUTROABS 3.1  --   HGB 12.7* 12.0*  HCT 38.3* 36.4*  MCV 92.1 93.1  PLT 411* 407*   Cardiac Enzymes: No results for input(s): CKTOTAL, CKMB, CKMBINDEX, TROPONINI in the last 168 hours. BNP: BNP (last 3 results) No results for input(s): BNP in the last 8760 hours.  ProBNP (last 3 results) No results for input(s): PROBNP in the last 8760 hours.  CBG: Recent Labs  Lab 02/18/19 1640 02/18/19 2011 02/19/19 0020 02/19/19 0427 02/19/19 0825  GLUCAP 83 94 99 109* 88       Signed:  Erron Wengert  [facility] 02/22/2019, 8:23 AM

## 2019-02-25 ENCOUNTER — Encounter (HOSPITAL_COMMUNITY): Payer: Self-pay | Admitting: Emergency Medicine

## 2019-03-28 ENCOUNTER — Ambulatory Visit: Payer: Medicaid Other | Admitting: Neurology

## 2019-03-28 ENCOUNTER — Telehealth: Payer: Self-pay

## 2019-03-28 NOTE — Telephone Encounter (Signed)
I called Travis Briggs at assistant living facility to stated appt for in office will be cancel. I stated when schedule it should of been a video visit due to COVID 19. Travis Briggs verbalized understanding and will call back to reschedule.

## 2019-07-11 ENCOUNTER — Ambulatory Visit (HOSPITAL_COMMUNITY)
Admission: AD | Admit: 2019-07-11 | Discharge: 2019-07-11 | Disposition: A | Discharge status: Home / Self Care | Payer: Medicaid Other | Attending: Psychiatry | Admitting: Psychiatry

## 2019-07-11 ENCOUNTER — Emergency Department (HOSPITAL_COMMUNITY)
Admission: EM | Admit: 2019-07-11 | Discharge: 2019-07-12 | Disposition: A | Discharge status: Skilled Nursing Facility | Payer: Medicaid Other | Attending: Emergency Medicine | Admitting: Emergency Medicine

## 2019-07-11 ENCOUNTER — Other Ambulatory Visit: Payer: Self-pay

## 2019-07-11 DIAGNOSIS — F209 Schizophrenia, unspecified: Secondary | ICD-10-CM | POA: Diagnosis not present

## 2019-07-11 DIAGNOSIS — S069XAA Unspecified intracranial injury with loss of consciousness status unknown, initial encounter: Secondary | ICD-10-CM | POA: Diagnosis present

## 2019-07-11 DIAGNOSIS — Z79899 Other long term (current) drug therapy: Secondary | ICD-10-CM | POA: Insufficient documentation

## 2019-07-11 DIAGNOSIS — Z20828 Contact with and (suspected) exposure to other viral communicable diseases: Secondary | ICD-10-CM | POA: Insufficient documentation

## 2019-07-11 DIAGNOSIS — Z87820 Personal history of traumatic brain injury: Secondary | ICD-10-CM | POA: Diagnosis not present

## 2019-07-11 DIAGNOSIS — F2089 Other schizophrenia: Secondary | ICD-10-CM | POA: Diagnosis not present

## 2019-07-11 DIAGNOSIS — S069X9A Unspecified intracranial injury with loss of consciousness of unspecified duration, initial encounter: Secondary | ICD-10-CM | POA: Diagnosis present

## 2019-07-11 DIAGNOSIS — R451 Restlessness and agitation: Secondary | ICD-10-CM | POA: Diagnosis present

## 2019-07-11 LAB — COMPREHENSIVE METABOLIC PANEL
ALT: 40 U/L (ref 0–44)
AST: 23 U/L (ref 15–41)
Albumin: 5 g/dL (ref 3.5–5.0)
Alkaline Phosphatase: 80 U/L (ref 38–126)
Anion gap: 6 (ref 5–15)
BUN: 14 mg/dL (ref 6–20)
CO2: 21 mmol/L — ABNORMAL LOW (ref 22–32)
Calcium: 9.2 mg/dL (ref 8.9–10.3)
Chloride: 112 mmol/L — ABNORMAL HIGH (ref 98–111)
Creatinine, Ser: 1.01 mg/dL (ref 0.61–1.24)
GFR calc Af Amer: >60 mL/min (ref >60)
GFR calc non Af Amer: >60 mL/min (ref >60)
Glucose, Bld: 89 mg/dL (ref 70–99)
Potassium: 3.8 mmol/L (ref 3.5–5.1)
Sodium: 139 mmol/L (ref 135–145)
Total Bilirubin: 0.4 mg/dL (ref 0.3–1.2)
Total Protein: 7.9 g/dL (ref 6.5–8.1)

## 2019-07-11 LAB — RAPID URINE DRUG SCREEN, HOSP PERFORMED
Amphetamines: NOT DETECTED
Barbiturates: POSITIVE — AB
Benzodiazepines: NOT DETECTED
Cocaine: NOT DETECTED
Opiates: NOT DETECTED
Tetrahydrocannabinol: POSITIVE — AB

## 2019-07-11 LAB — CBC
HCT: 42.4 % (ref 39.0–52.0)
Hemoglobin: 13.3 g/dL (ref 13.0–17.0)
MCH: 30.6 pg (ref 26.0–34.0)
MCHC: 31.4 g/dL (ref 30.0–36.0)
MCV: 97.5 fL (ref 80.0–100.0)
Platelets: 217 10*3/uL (ref 150–400)
RBC: 4.35 MIL/uL (ref 4.22–5.81)
RDW: 13.2 % (ref 11.5–15.5)
WBC: 5.5 10*3/uL (ref 4.0–10.5)
nRBC: 0 % (ref 0.0–0.2)

## 2019-07-11 LAB — ETHANOL: Alcohol, Ethyl (B): <10 mg/dL (ref <10)

## 2019-07-11 LAB — PHENYTOIN LEVEL, TOTAL: Phenytoin Lvl: 15 ug/mL (ref 10.0–20.0)

## 2019-07-11 IMAGING — CR DG HAND COMPLETE 3+V*R*
3 series · 3 of 3 positions shown · non-contrast
Comparison: None.

CLINICAL DATA: 42-year-old male status post blunt trauma 1 week ago
with continued pain.

EXAM:
RIGHT HAND - COMPLETE 3+ VIEW

[x hand pa right]
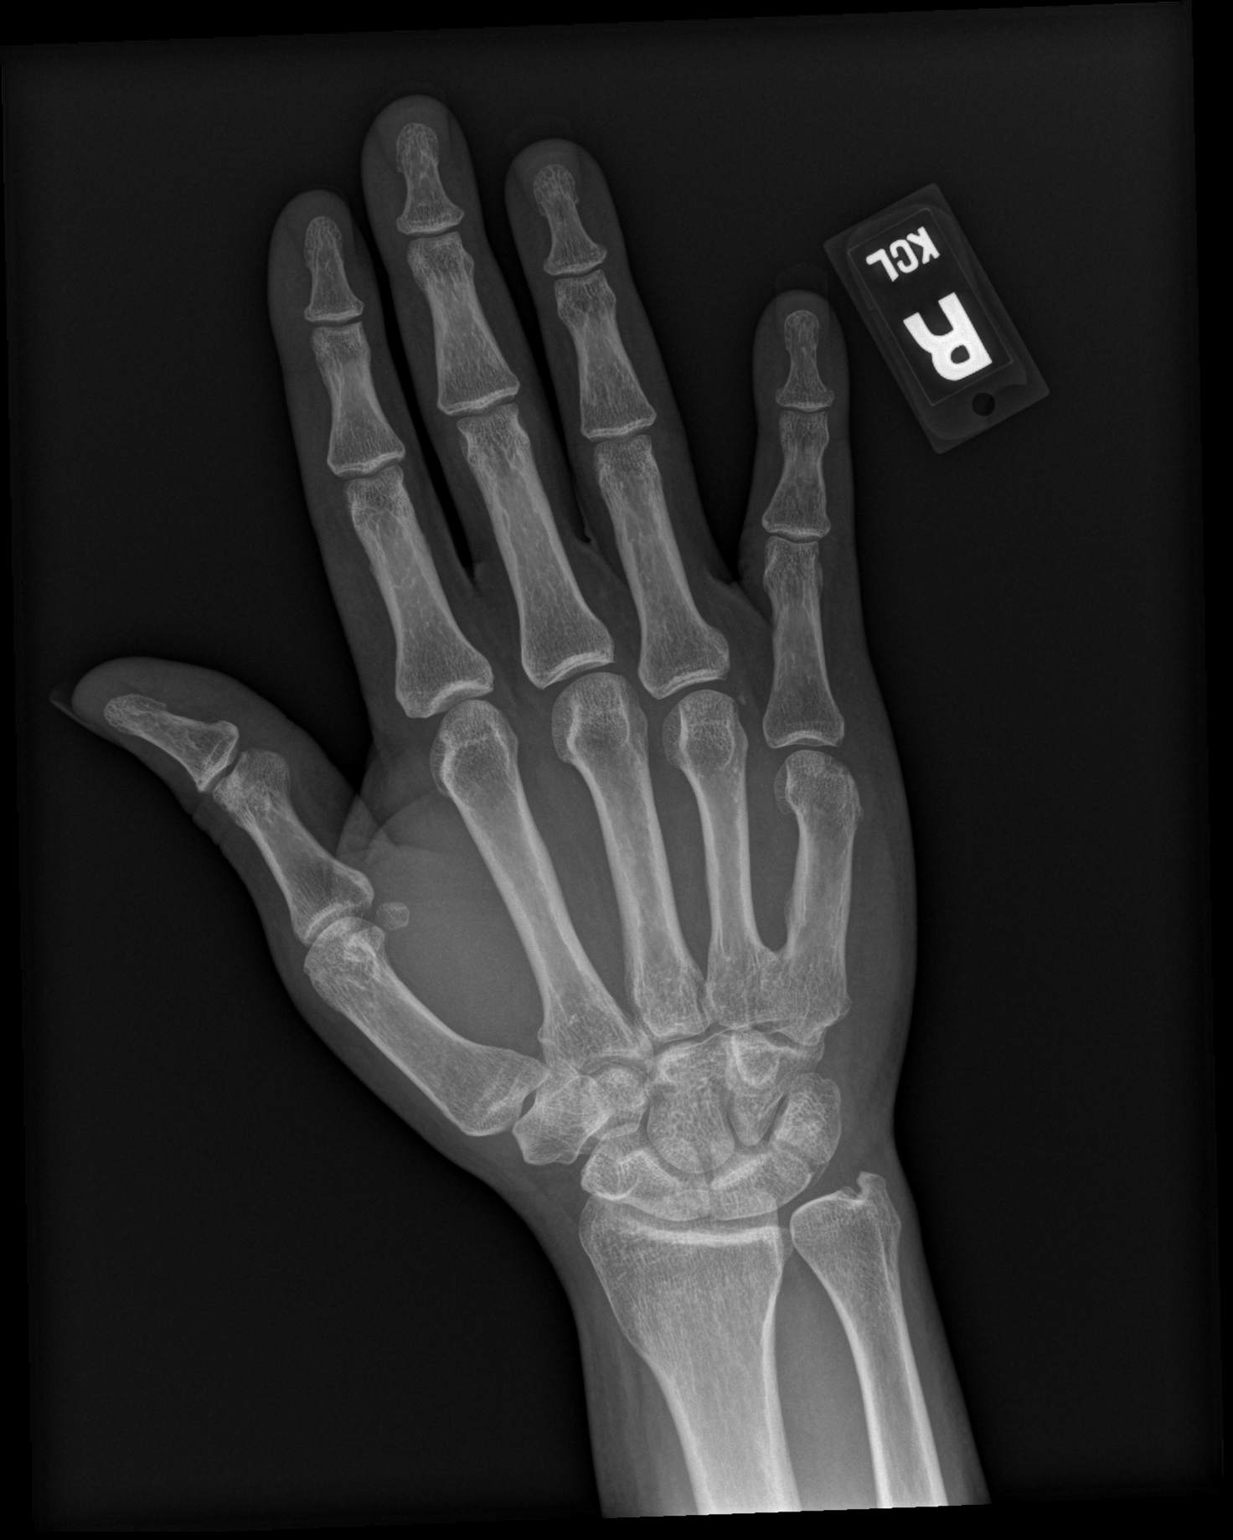

[x hand obl right]
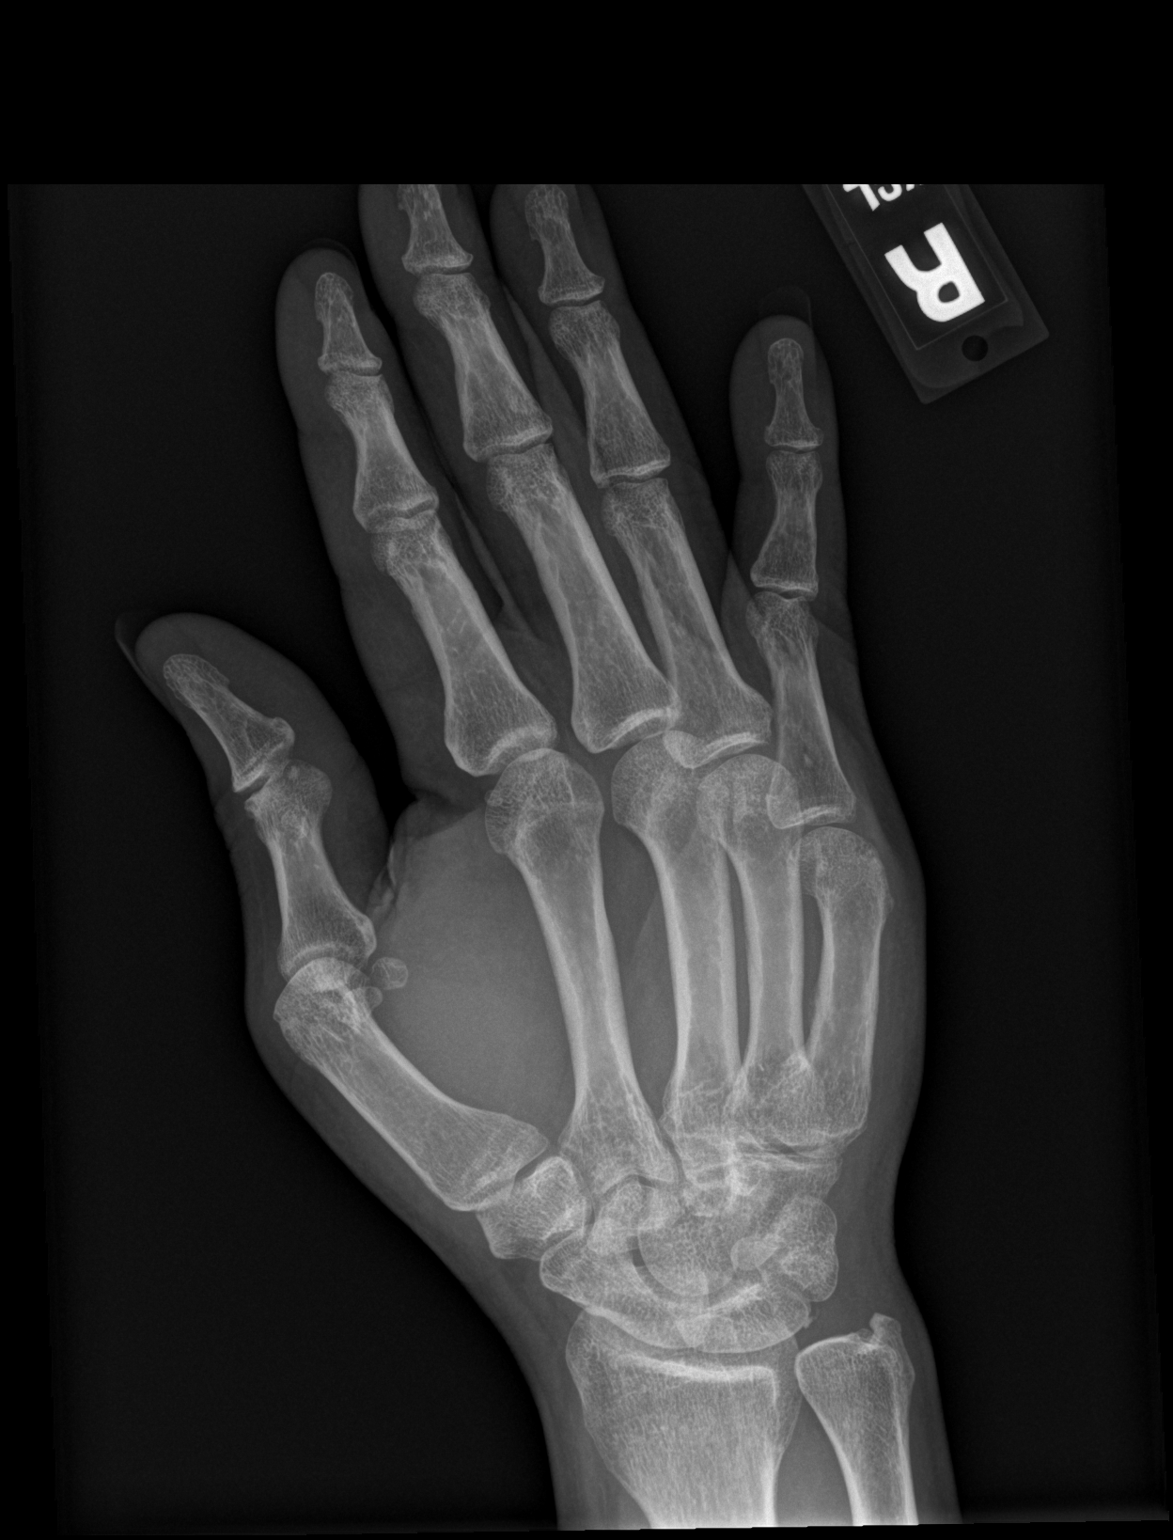

[x hand lat right]
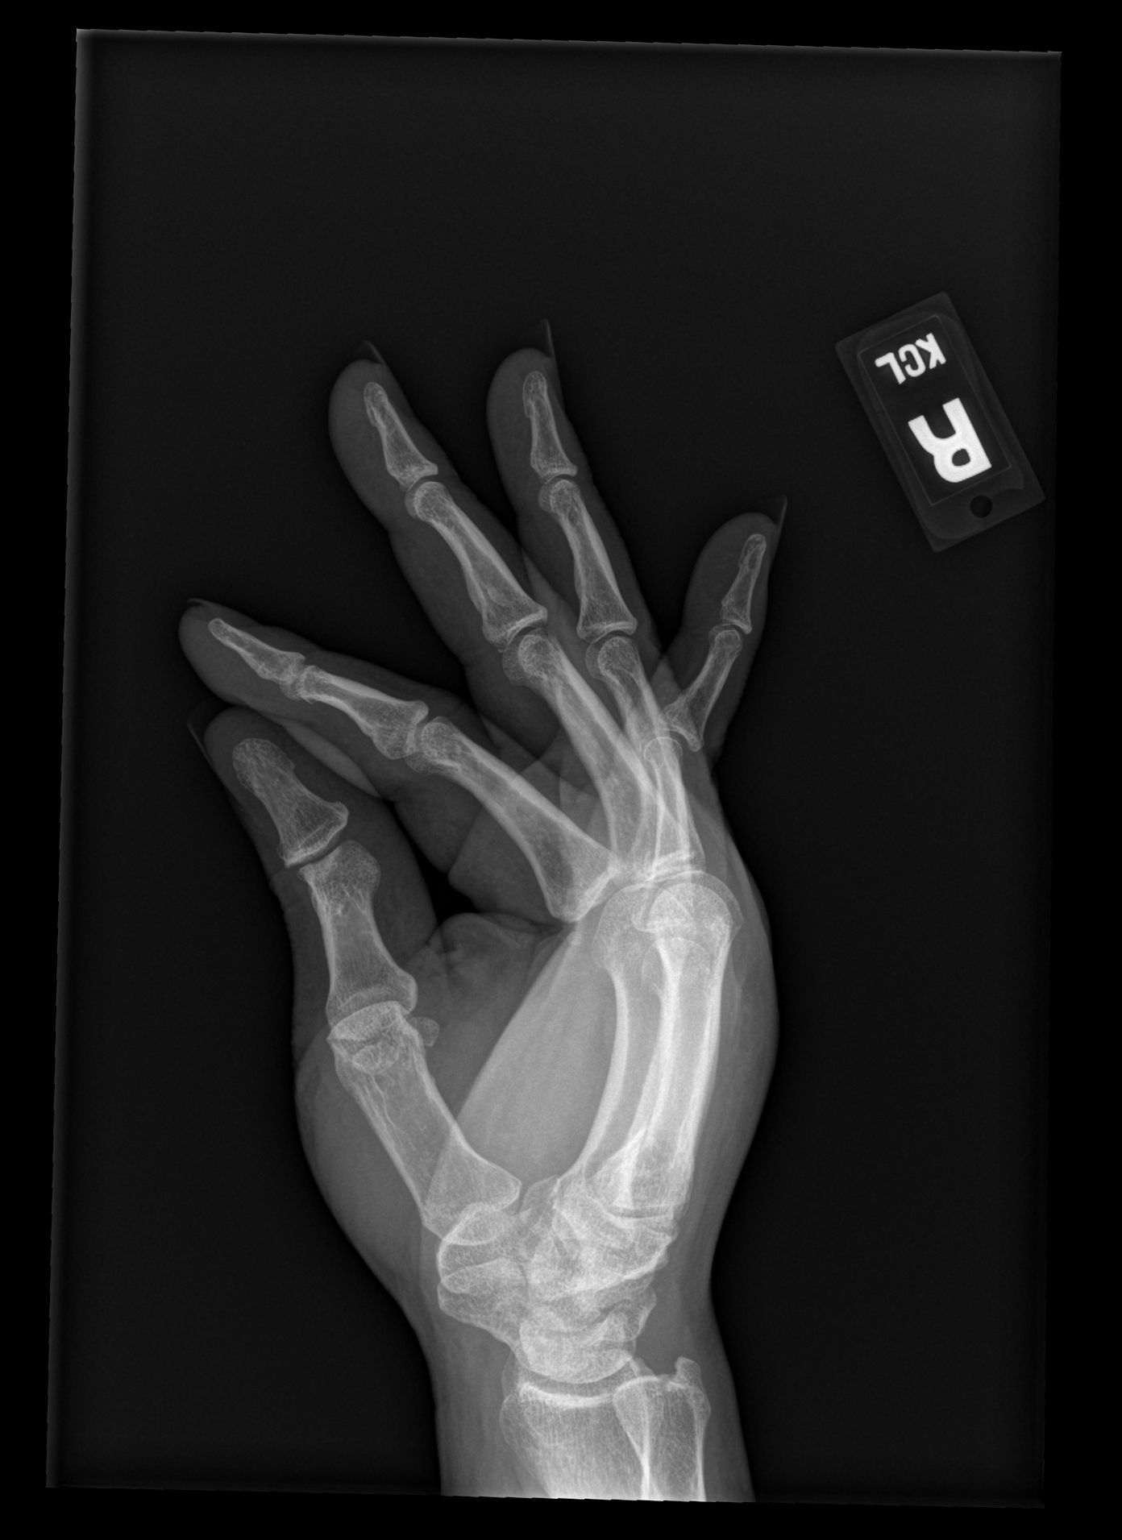

[3 of 3 positions shown; findings below may reference images not displayed]

FINDINGS: Generalized soft tissue swelling at the right hand. Bone
mineralization is within normal limits. Healed chronic fracture of
the right 5th metacarpal. Joint spaces and alignment are preserved.
Distal right radius and ulna appear intact. The no acute osseous
abnormality identified.
IMPRESSION: Generalized soft tissue swelling with no acute fracture or
dislocation identified about the right hand.

## 2019-07-11 IMAGING — CR DG SHOULDER 2+V*R*
2 series · 2 of 2 positions shown · non-contrast
Comparison: None.

CLINICAL DATA: 42-year-old male status post blunt trauma 1 week ago
with continued pain.

EXAM:
RIGHT SHOULDER - 2+ VIEW

[w shoulder external right]
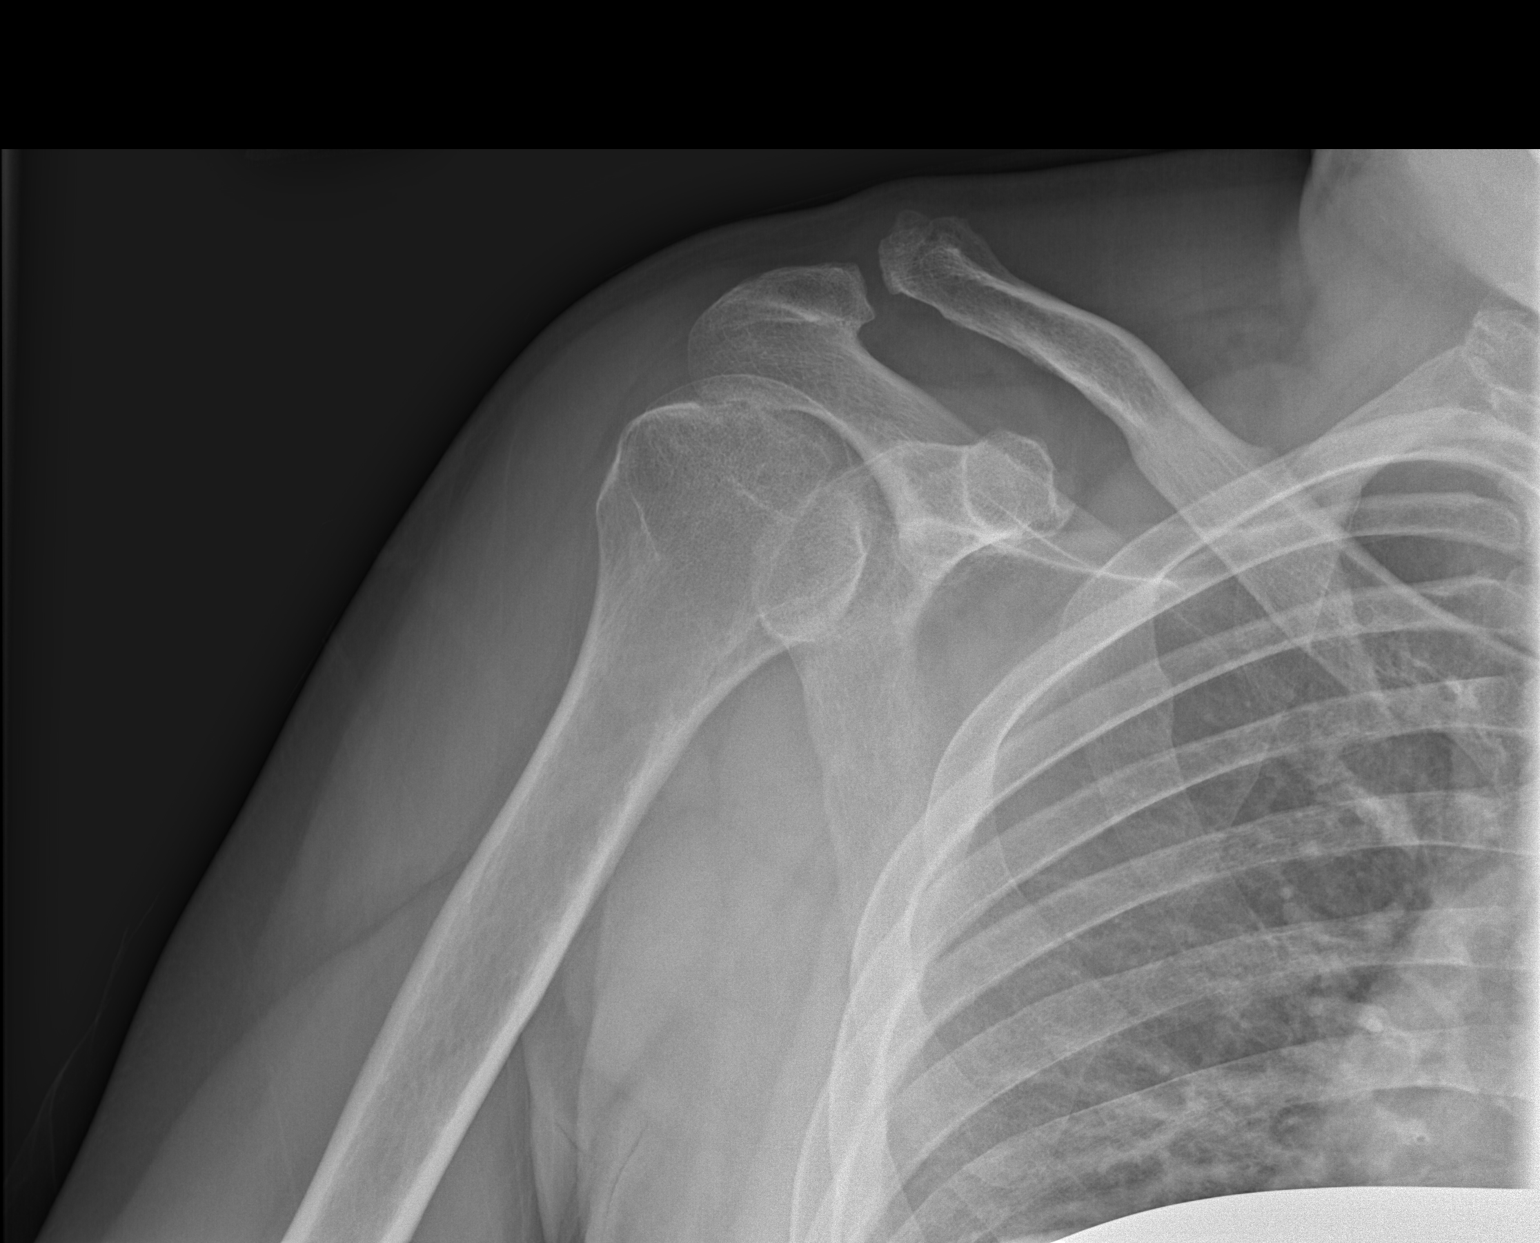

[w shoulder y-view right]
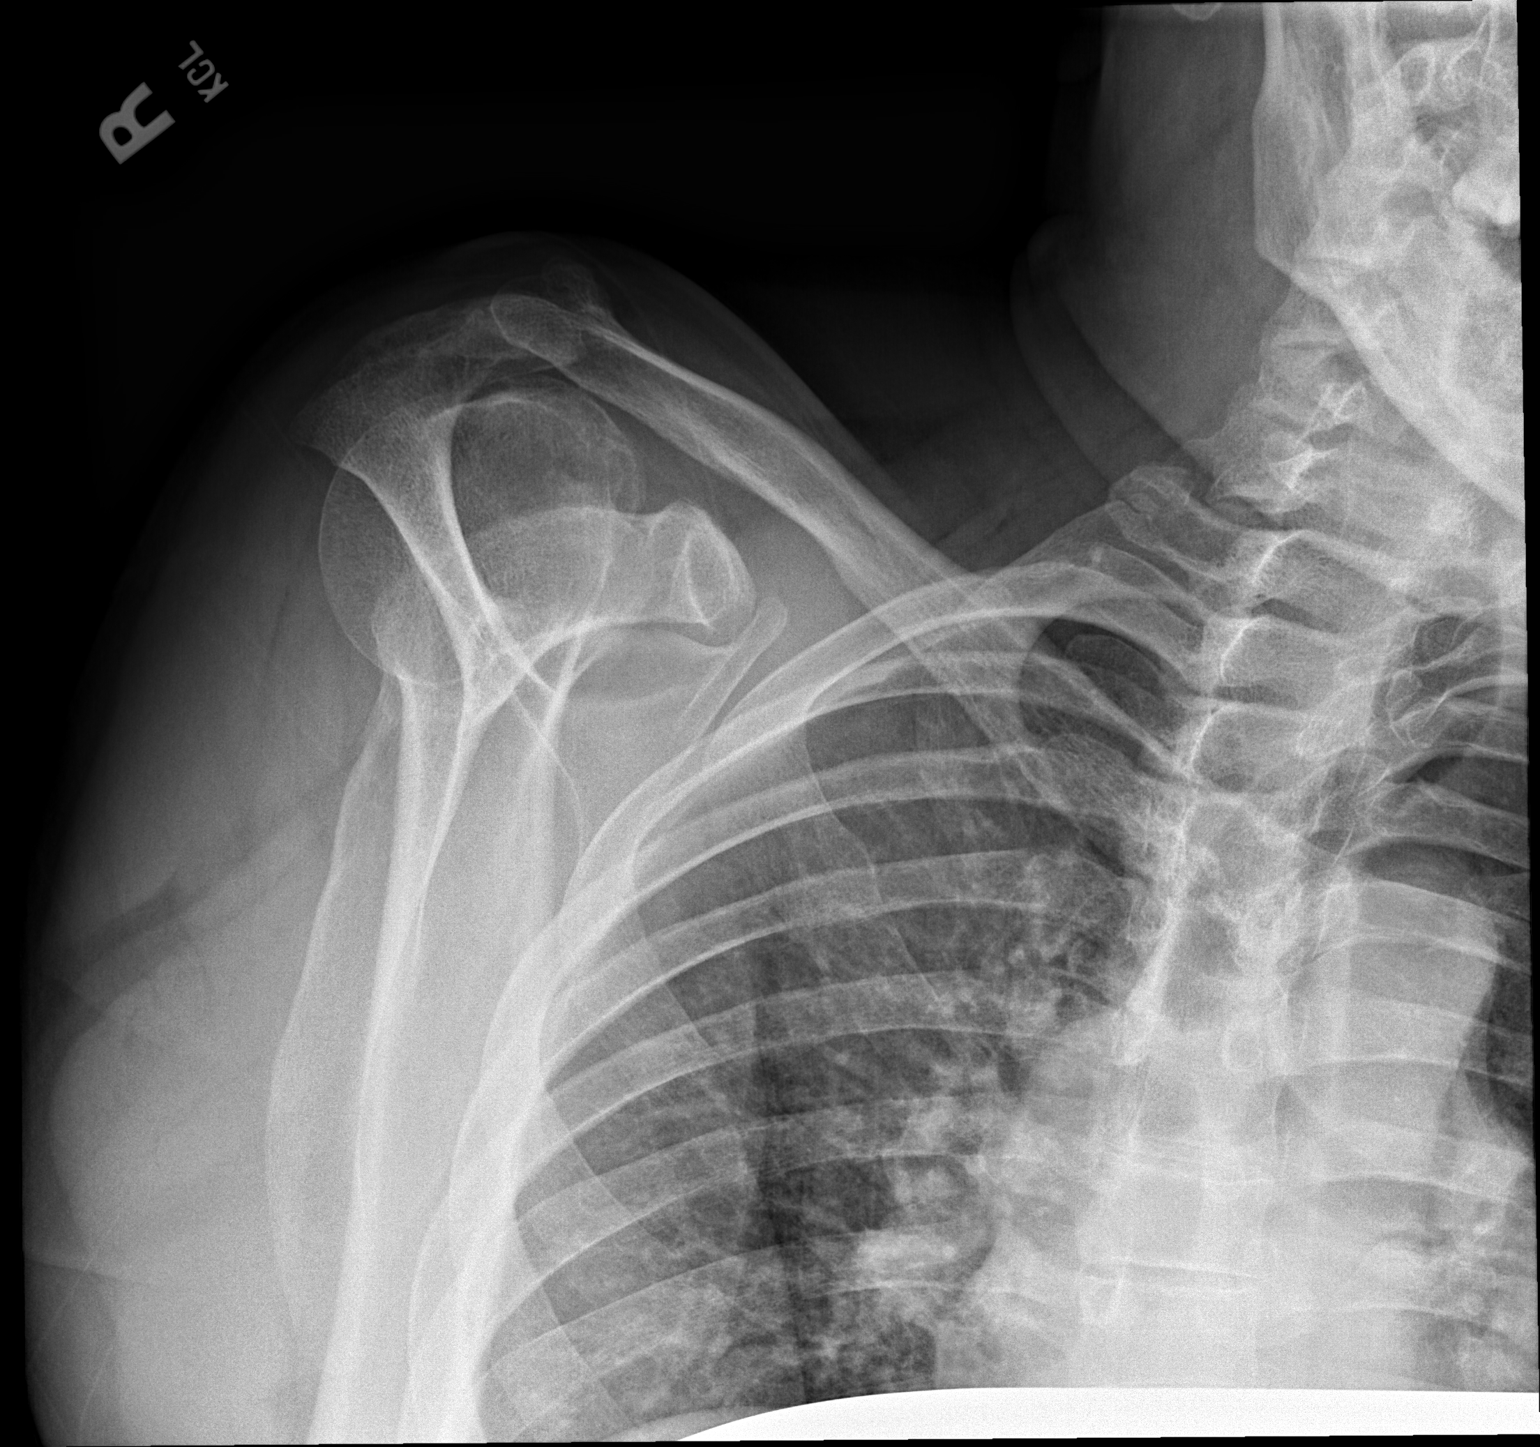

[2 of 2 positions shown; findings below may reference images not displayed]

FINDINGS: Two views of the right shoulder. No glenohumeral joint dislocation.
The proximal right humerus appears intact. The right clavicle in
scapula appear intact with mild degenerative spurring at the right
AC joint. Negative visible right ribs and lung parenchyma.
IMPRESSION: No acute fracture or dislocation identified about the right
shoulder.

## 2019-07-11 MED ORDER — ADULT MULTIVITAMIN W/MINERALS CH
1.0000 | ORAL_TABLET | Freq: Every day | ORAL | Status: DC
  Administered 2019-07-11 – 2019-07-12 (×2): 1 via ORAL
  Filled 2019-07-11 (×2): qty 1

## 2019-07-11 MED ORDER — LEVETIRACETAM 500 MG PO TABS
1000.0000 mg | ORAL_TABLET | Freq: Two times a day (BID) | ORAL | Status: DC
  Administered 2019-07-11 – 2019-07-12 (×2): 1000 mg via ORAL
  Filled 2019-07-11 (×2): qty 2

## 2019-07-11 MED ORDER — METOPROLOL TARTRATE 25 MG PO TABS
12.5000 mg | ORAL_TABLET | Freq: Two times a day (BID) | ORAL | Status: DC
  Administered 2019-07-11 – 2019-07-12 (×2): 12.5 mg via ORAL
  Filled 2019-07-11 (×2): qty 1

## 2019-07-11 MED ORDER — ATORVASTATIN CALCIUM 40 MG PO TABS
40.0000 mg | ORAL_TABLET | Freq: Every day | ORAL | Status: DC
  Filled 2019-07-11 (×2): qty 1

## 2019-07-11 MED ORDER — ZIPRASIDONE MESYLATE 20 MG IM SOLR: 10.0000 mg | Freq: Once | INTRAMUSCULAR | Status: DC

## 2019-07-11 MED ORDER — ACETAMINOPHEN 325 MG PO TABS: 650.0000 mg | ORAL_TABLET | ORAL | Status: DC | PRN

## 2019-07-11 MED ORDER — PHENYTOIN SODIUM EXTENDED 100 MG PO CAPS
100.0000 mg | ORAL_CAPSULE | Freq: Three times a day (TID) | ORAL | Status: DC
  Administered 2019-07-11 – 2019-07-12 (×3): 100 mg via ORAL
  Filled 2019-07-11 (×3): qty 1

## 2019-07-11 MED ORDER — FERROUS SULFATE 325 (65 FE) MG PO TABS
325.0000 mg | ORAL_TABLET | Freq: Two times a day (BID) | ORAL | Status: DC
  Administered 2019-07-12: 325 mg via ORAL
  Filled 2019-07-11 (×3): qty 1

## 2019-07-11 MED ORDER — ACAMPROSATE CALCIUM 333 MG PO TBEC
666.0000 mg | DELAYED_RELEASE_TABLET | Freq: Three times a day (TID) | ORAL | Status: DC
  Administered 2019-07-11 – 2019-07-12 (×3): 666 mg via ORAL
  Filled 2019-07-11 (×4): qty 2

## 2019-07-11 MED ORDER — DOCUSATE SODIUM 100 MG PO CAPS
100.0000 mg | ORAL_CAPSULE | Freq: Two times a day (BID) | ORAL | Status: DC
  Administered 2019-07-11 – 2019-07-12 (×2): 100 mg via ORAL
  Filled 2019-07-11 (×2): qty 1

## 2019-07-11 MED ORDER — QUETIAPINE FUMARATE 100 MG PO TABS
100.0000 mg | ORAL_TABLET | Freq: Two times a day (BID) | ORAL | Status: DC
  Administered 2019-07-11 – 2019-07-12 (×2): 100 mg via ORAL
  Filled 2019-07-11 (×2): qty 1

## 2019-07-11 MED ORDER — SERTRALINE HCL 50 MG PO TABS
100.0000 mg | ORAL_TABLET | Freq: Every day | ORAL | Status: DC
  Administered 2019-07-11 – 2019-07-12 (×2): 100 mg via ORAL
  Filled 2019-07-11 (×2): qty 2

## 2019-07-11 NOTE — BH Assessment (Signed)
Tinnie Gens, NP recommends inpatient. Patient referred to the following hospitals. Pending review:    [facility]    [facility]    [facility] Details      [facility]    [facility]     [facility]      [place]

## 2019-07-11 NOTE — ED Notes (Signed)
Pt belongings bags (x2) placed in locker 29. Belongings include shoes, pants, belt, wallet, and shirt. Wallet included $443; however, Pt stated to keep all his belongings together.

## 2019-07-11 NOTE — ED Provider Notes (Signed)
[facility] DEPT Provider Note   CSN: [id number] Arrival date & time: 07/11/19  1439     History   Chief Complaint Chief Complaint  Patient presents with  . IVC'd  . Agitation    HPI Travis Briggs. is a 43 y.o. male.     43 year old male with history of seizure disorder as well as traumatic brain injury presents under IVC.  Patient is reported a danger to himself and others.  He lives in the facility has not been following the rules.  He has been threatening to harm others with a gun although he does not have any access.  No reported Si.  Patient himself denies the above statements.  He is slightly cooperative for the assessment.  Presents via Event organiser under IVC     Past Medical History:  Diagnosis Date  . Seizures ([place])   . TBI (traumatic brain injury) [facility])     Patient Active Problem List   Diagnosis Date Noted  . Thrombocytopenia ([place]) 02/08/2019  . Status epilepticus ([place]) 02/04/2019  . Dental cyst 02/04/2019  . 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 CRANIOPLASTY    . CRANIOTOMY          Home Medications    Prior to Admission medications   Medication Sig Start Date End Date Taking? Authorizing Provider  acamprosate (CAMPRAL) 333 MG tablet Take 666 mg by mouth 3 (three) times daily with meals.    [provider]  acetaminophen (TYLENOL) 325 MG tablet Take 650 mg by mouth every 4 (four) hours as needed for mild pain.    [provider]  atorvastatin (LIPITOR) 40 MG tablet Take 40 mg by mouth at bedtime.    [provider]  atorvastatin (LIPITOR) 40 MG tablet Take 40 mg by mouth at bedtime.    [provider]  clotrimazole (LOTRIMIN) 1 % cream Apply to affected area 2 times daily Patient not taking: Reported on 06/09/2018 08/04/17   Montine Circle, PA-C  clotrimazole-betamethasone (LOTRISONE) cream Apply 1 application  topically 2 (two) times daily. 07/16/18   Edrick Kins, DPM  docusate sodium (COLACE) 100 MG capsule Take 100 mg by mouth 2 (two) times daily.    [provider]  docusate sodium (STOOL SOFTENER) 100 MG capsule Take 100 mg by mouth 2 (two) times daily.    [provider]  ferrous sulfate (FEROSUL) 325 (65 FE) MG tablet Take 325 mg by mouth 2 (two) times daily with a meal.    [provider]  ferrous sulfate 325 (65 FE) MG tablet Take 325 mg by mouth 2 (two) times daily with a meal.    [provider]  lacosamide (VIMPAT) 200 MG TABS tablet Take 1 tablet (200 mg total) by mouth 2 (two) times daily for 30 days. 02/22/19 03/24/19  Mikhail, Velta Addison, DO  levETIRAcetam (KEPPRA) 1000 MG tablet Take 1,000 mg by mouth 2 (two) times daily.    [provider]  levETIRAcetam (KEPPRA) 750 MG tablet Take 2 tablets (1,500 mg total) by mouth 2 (two) times daily for 30 days. 02/22/19 03/24/19  Cristal Ford, DO  metoprolol tartrate (LOPRESSOR) 25 MG tablet Take 12.5 mg by mouth 2 (two) times daily.    [provider]  Multiple Vitamin (MULTIVITAMIN WITH MINERALS) TABS tablet Take 1 tablet by mouth daily. 02/22/19   Mikhail, Velta Addison, DO  naltrexone (DEPADE) 50 MG tablet Take 50 mg by  mouth 2 (two) times a day.    [provider]  phenytoin (DILANTIN) 100 MG ER capsule Take 1 capsule (100 mg total) by mouth 3 (three) times daily. 06/10/18   Lacretia Leigh, MD  phenytoin (DILANTIN) 100 MG ER capsule Take 100 mg by mouth 3 (three) times daily.    [provider]  polyethylene glycol (MIRALAX / GLYCOLAX) 17 g packet Take 17 g by mouth daily.    [provider]  polyethylene glycol (MIRALAX / GLYCOLAX) packet Take 17 g by mouth daily.    [provider]  QUEtiapine (SEROQUEL) 100 MG tablet Take 100 mg by mouth 2 (two) times daily.    [provider]  QUEtiapine (SEROQUEL) 50 MG tablet Take 50 mg by mouth 2 (two) times daily.     [provider]  senna-docusate (SENOKOT-S) 8.6-50 MG tablet Take 1 tablet by mouth at bedtime as needed for mild constipation. 02/22/19   Mikhail, Velta Addison, DO  sertraline (ZOLOFT) 100 MG tablet Take 100 mg by mouth daily.    [provider]  sertraline (ZOLOFT) 100 MG tablet Take 100 mg by mouth daily.    [provider]  talc powder Apply topically as needed. Patient not taking: Reported on 06/09/2018 08/04/17   Montine Circle, PA-C  topiramate (TOPAMAX) 200 MG tablet Take 1 tablet (200 mg total) by mouth 2 (two) times daily for 30 days. 02/22/19 03/24/19  Cristal Ford, DO    Family History No family history on file.  Social History Social History   Tobacco Use  . Smoking status: Never Smoker  Substance Use Topics  . Alcohol use: Yes  . Drug use: Yes    Types: Cocaine     Allergies   Patient has no known allergies.   Review of Systems Review of Systems  Unable to perform ROS: Psychiatric disorder     Physical Exam Updated Vital Signs BP (!) 134/102 (BP Location: Right Arm)   Pulse 87   Temp 98.4 F (36.9 C) (Oral)   Resp 19   Physical Exam Vitals signs and nursing note reviewed.  Constitutional:      General: He is not in acute distress.    Appearance: Normal appearance. He is well-developed. He is not toxic-appearing.  HENT:     Head: Normocephalic and atraumatic.  Eyes:     General: Lids are normal.     Conjunctiva/sclera: Conjunctivae normal.     Pupils: Pupils are equal, round, and reactive to light.  Neck:     Musculoskeletal: Normal range of motion and neck supple.     Thyroid: No thyroid mass.     Trachea: No tracheal deviation.  Cardiovascular:     Rate and Rhythm: Normal rate and regular rhythm.     Heart sounds: Normal heart sounds. No murmur. No gallop.   Pulmonary:     Effort: Pulmonary effort is normal. No respiratory distress.     Breath sounds: Normal breath sounds. No stridor. No decreased breath sounds,  wheezing, rhonchi or rales.  Abdominal:     General: Bowel sounds are normal. There is no distension.     Palpations: Abdomen is soft.     Tenderness: There is no abdominal tenderness. There is no rebound.  Musculoskeletal: Normal range of motion.        General: No tenderness.  Skin:    General: Skin is warm and dry.     Findings: No abrasion or rash.  Neurological:     Mental  Status: He is alert and oriented to person, place, and time.     GCS: GCS eye subscore is 4. GCS verbal subscore is 5. GCS motor subscore is 6.     Cranial Nerves: No cranial nerve deficit.     Sensory: No sensory deficit.     Gait: Gait normal.  Psychiatric:        Attention and Perception: He is inattentive.        Mood and Affect: Affect is labile.        Speech: Speech is rapid and pressured.        Behavior: Behavior is agitated and aggressive.        Thought Content: Thought content is delusional. Thought content does not include homicidal or suicidal plan.      ED Treatments / Results  Labs (all labs ordered are listed, but only abnormal results are displayed) Labs Reviewed  SARS CORONAVIRUS 2 ([facility])  COMPREHENSIVE METABOLIC PANEL  ETHANOL  CBC  RAPID URINE DRUG SCREEN, HOSP PERFORMED  PHENYTOIN LEVEL, TOTAL    EKG None  Radiology No results found.  Procedures Procedures (including critical care time)  Medications Ordered in ED Medications  ziprasidone (GEODON) injection 10 mg (has no administration in time range)     Initial Impression / Assessment and Plan / ED Course  I have reviewed the triage vital signs and the nursing notes.  Pertinent labs & imaging results that were available during my care of the patient were reviewed by me and considered in my medical decision making (see chart for details).        Patient will be medically cleared for psychiatric disposition  Final Clinical Impressions(s) / ED Diagnoses   Final  diagnoses:  None    ED Discharge Orders    None       Lacretia Leigh, MD 07/11/19 1533

## 2019-07-11 NOTE — BH Assessment (Signed)
Assessment Note  Travis Briggs. is an 43 y.o. male. Pt was brought in by [employer]. The Pt was IVCd by his group home [place]. Per group home they cannot manage the Pt's behaviors at this time. The Pt was a poor historian. The Pt's speech was mumbled. The Pt's speech was tangential and had flight of ideas. The Pt had difficulty answering the assessment questions. The Pt was agitated throughout the assessment.  Dr. Dwyane Dee recommended inpatient treatment.  Diagnosis:   F20.9 Schizophrenia  Past Medical History:  Past Medical History:  Diagnosis Date  . Seizures ([place])   . TBI (traumatic brain injury) [facility]     Past Surgical History:  Procedure Laterality Date  . CRANIOPLASTY    . CRANIOTOMY      Family History: No family history on file.  Social History:  reports that he has never smoked. He does not have any smokeless tobacco history on file. He reports current alcohol use. He reports current drug use. Drug: Cocaine.  Additional Social History:  Alcohol / Drug Use Pain Medications: please see mar Prescriptions: please see mar Over the Counter: please see mar History of alcohol / drug use?: No history of alcohol / drug abuse Longest period of sobriety (when/how long): NA  CIWA:   COWS:    Allergies: No Known Allergies  Home Medications: (Not in a hospital admission)   OB/GYN Status:  No LMP for male patient.  General Assessment Data Location of Assessment: [facility] Assessment Services TTS Assessment: In system Is this a Tele or Face-to-Face Assessment?: Face-to-Face Is this an Initial Assessment or a Re-assessment for this encounter?: Initial Assessment Patient Accompanied by:: Other Language Other than English: No Living Arrangements: Other (Comment) What gender do you identify as?: Male Marital status: Single Maiden name: NA Pregnancy Status: No Living Arrangements: Alone Can pt return to current living arrangement?: Yes Admission Status:  Involuntary Petitioner: Other Is patient capable of signing voluntary admission?: No Referral Source: Self/Family/Friend Insurance type: SP  Medical Screening Exam [facility] Walk-in ONLY) Medical Exam completed: Yes  Crisis Care Plan Living Arrangements: Alone Legal Guardian: Mother Name of Psychiatrist: NA Name of Therapist: NA  Education Status Is patient currently in school?: No Is the patient employed, unemployed or receiving disability?: Unemployed, Receiving disability income  Risk to self with the past 6 months Suicidal Ideation: No Has patient been a risk to self within the past 6 months prior to admission? : No Suicidal Intent: No Has patient had any suicidal intent within the past 6 months prior to admission? : No Is patient at risk for suicide?: No Suicidal Plan?: No Has patient had any suicidal plan within the past 6 months prior to admission? : No Access to Means: No What has been your use of drugs/alcohol within the last 12 months?: NA Previous Attempts/Gestures: No How many times?: 0 Other Self Harm Risks: NA Triggers for Past Attempts: None known Intentional Self Injurious Behavior: None Family Suicide History: No Recent stressful life event(s): Conflict (Comment) Persecutory voices/beliefs?: No Depression Symptoms: (Pt denies) Substance abuse history and/or treatment for substance abuse?: No Suicide prevention information given to non-admitted patients: Not applicable  Risk to Others within the past 6 months Homicidal Ideation: No Does patient have any lifetime risk of violence toward others beyond the six months prior to admission? : No Thoughts of Harm to Others: No Current Homicidal Intent: No Current Homicidal Plan: No Access to Homicidal Means: No Identified Victim: NA History of harm  to others?: No Assessment of Violence: None Noted Violent Behavior Description: NA Does patient have access to weapons?: No Criminal Charges Pending?: No Does  patient have a court date: No Is patient on probation?: No  Psychosis Hallucinations: None noted Delusions: None noted  Mental Status Report Appearance/Hygiene: Unremarkable Eye Contact: Fair Motor Activity: Freedom of movement Speech: Logical/coherent Level of Consciousness: Alert Mood: Depressed Affect: Depressed Anxiety Level: Minimal Thought Processes: Coherent, Relevant Judgement: Unimpaired Orientation: Person, Place, Time, Situation Obsessive Compulsive Thoughts/Behaviors: None  Cognitive Functioning Concentration: Normal Memory: Recent Intact, Remote Intact Is patient IDD: No Insight: Poor Impulse Control: Poor Appetite: Poor Have you had any weight changes? : No Change Sleep: No Change Total Hours of Sleep: 78 Vegetative Symptoms: None  ADLScreening [facility] Services) Patient's cognitive ability adequate to safely complete daily activities?: No Patient able to express need for assistance with ADLs?: Yes Independently performs ADLs?: Yes (appropriate for developmental age)  Prior Inpatient Therapy Prior Inpatient Therapy: Yes Prior Therapy Dates: multiple  Prior Therapy Facilty/Provider(s): unknown Reason for Treatment: schizophrenia  Prior Outpatient Therapy Prior Outpatient Therapy: Yes Prior Therapy Dates: unknown Prior Therapy Facilty/Provider(s): unknown Reason for Treatment: Schiziiphrenia Does patient have an ACCT team?: No Does patient have Intensive In-House Services?  : No Does patient have [facility] services? : No Does patient have P4CC services?: No  ADL Screening (condition at time of admission) Patient's cognitive ability adequate to safely complete daily activities?: No Is the patient deaf or have difficulty hearing?: No Does the patient have difficulty seeing, even when wearing glasses/contacts?: Yes Does the patient have difficulty concentrating, remembering, or making decisions?: Yes Patient able to express need for assistance  with ADLs?: Yes Does the patient have difficulty dressing or bathing?: No Independently performs ADLs?: Yes (appropriate for developmental age)       Abuse/Neglect Assessment (Assessment to be complete while patient is alone) Abuse/Neglect Assessment Can Be Completed: Yes Physical Abuse: Denies Verbal Abuse: Denies Sexual Abuse: Denies Exploitation of patient/patient's resources: Denies     Regulatory affairs officer (For Healthcare) Does Patient Have a Medical Advance Directive?: No Would patient like information on creating a medical advance directive?: No - Patient declined          Disposition:  Disposition Initial Assessment Completed for this Encounter: Yes Disposition of Patient: Admit Type of inpatient treatment program: Adult  On Site Evaluation by:   Reviewed with Physician:    Lorenza Cambridge D 07/11/2019 6:01 PM

## 2019-07-11 NOTE — H&P (Signed)
Behavioral Health Medical Screening Exam  Travis Briggs. is an 43 y.o. male.who presented to [facility], involuntarily, with [facility]. Patient is a very poor historian. His thought process is disorganized, though content illogical. Patient reports he is unsure why he was brought to [facility]. He presents with a history of TBI and schizophrenia. He was IVC'd by his group home. He denies SI, HI and AVH. He denies alcohol and substance abuse although per IVC, he has been smoking, " pot."  He has a documented history of seizures.   Per IVC, respondant  is a danger to himself and others. He continues to think he lives in [facility] and has a wife. He continues to stale he has weapons and threatens to [employer], staff, and residents. He threatens staff, tells, and curse. He will not abide by facility rules. He left the facility and slept on a park bench where he had a seizure.    I did attempt to contact patients group home. There was a number on file; [place] however, I was unable to reach anyone.    Total Time spent with patient: 15 minutes  Psychiatric Specialty Exam: Physical Exam  Vitals reviewed. Constitutional: He is oriented to person, place, and time.  Neurological: He is alert and oriented to person, place, and time.    Review of Systems  Psychiatric/Behavioral: Positive for substance abuse.    There were no vitals taken for this visit.There is no height or weight on file to calculate BMI.  General Appearance: Guarded  Eye Contact:  Fair  Speech:  Clear and Coherent  Volume:  Normal  Mood:  Irritable  Affect:  Labile  Thought Process:  Disorganized and Descriptions of Associations: Tangential  Orientation:  Full (Time, Place, and Person)  Thought Content:  Illogical  Suicidal Thoughts:  No  Homicidal Thoughts:  No  Memory:  Immediate;   Poor Recent;   Poor  Judgement:  Impaired  Insight:  Lacking  Psychomotor Activity:  Restlessness   Concentration: Concentration: Poor and Attention Span: Poor  Recall:  Poor  Fund of Knowledge:Poor  Language: Fair  Akathisia:  Negative  Handed:  Right  AIMS (if indicated):     Assets:  Housing  Sleep:       Musculoskeletal: Strength & Muscle Tone: within normal limits Gait & Station: normal Patient leans: N/A  There were no vitals taken for this visit.  Recommendations:  Discussed case with Dr. Dwyane Dee who has recommended medical clearance due to his history of seizures. Will continue to try to reach group home for additional information. I am recommending that social work get involved to reach out to group homes as well. At this point, it is unclear if he will be able to return back to group home.    Mordecai Maes, NP 07/11/2019, 2:33 PM

## 2019-07-11 NOTE — ED Triage Notes (Signed)
Patient reports to the ED after being IVC'd by the facility he was staying out. It is reported that the patient was behaving erratically and becoming violent with staff and threatening.  Patient is alert but no oriented to place or situation.  Patient is agitated at this time.

## 2019-07-11 NOTE — ED Notes (Signed)
Pt loud, confused.

## 2019-07-11 NOTE — ED Notes (Signed)
Pt refused Covid test

## 2019-07-11 NOTE — Progress Notes (Signed)
Received Travis Briggs at shift change awake in his room watching TV. He is OOB at intervals to the bathroom. His sister Travis Briggs called and left her phone number [phone]. He was compliant with his medications.

## 2019-07-12 DIAGNOSIS — F2089 Other schizophrenia: Secondary | ICD-10-CM

## 2019-07-12 DIAGNOSIS — S069XAA Unspecified intracranial injury with loss of consciousness status unknown, initial encounter: Secondary | ICD-10-CM | POA: Diagnosis present

## 2019-07-12 DIAGNOSIS — F209 Schizophrenia, unspecified: Secondary | ICD-10-CM | POA: Diagnosis present

## 2019-07-12 DIAGNOSIS — S069X9A Unspecified intracranial injury with loss of consciousness of unspecified duration, initial encounter: Secondary | ICD-10-CM | POA: Diagnosis present

## 2019-07-12 LAB — SARS CORONAVIRUS 2 BY RT PCR (HOSPITAL ORDER, PERFORMED IN ~~LOC~~ HOSPITAL LAB): SARS Coronavirus 2: NEGATIVE

## 2019-07-12 MED ORDER — METOPROLOL TARTRATE 25 MG PO TABS: 12.5000 mg | ORAL_TABLET | Freq: Two times a day (BID) | ORAL | 0 refills | Status: AC

## 2019-07-12 MED ORDER — ZIPRASIDONE MESYLATE 20 MG IM SOLR
20.0000 mg | Freq: Once | INTRAMUSCULAR | Status: AC
  Administered 2019-07-12: 09:00:00 20 mg via INTRAMUSCULAR
  Filled 2019-07-12: qty 20

## 2019-07-12 MED ORDER — STERILE WATER FOR INJECTION IJ SOLN
INTRAMUSCULAR | Status: AC
  Administered 2019-07-12: 09:00:00 10 mL
  Filled 2019-07-12: qty 10

## 2019-07-12 NOTE — Progress Notes (Addendum)
1:52p CSW spoke with manager of [facility], [place] about patient returning to their facility and has been psychiatrically cleared. Blondie reports she spoke with another Education officer, museum (assuming someone in TTS) and stated to them that patient was to go to another facility after d/c from the hospital. Curt Bears reports the facility is in [place]. CSW explained that because this arrangement for patient to go to another facility was not coordinated by the hospital, patient will have to return back to [facility] and have those arrangements made. Blondie then asked CSW for her name and stated "okay" for patient to return. CSW made TTS and RN aware. Patient to be transported via [facility]. Blondie did not provide a number for report.   12:05p CSW was notified by TTS disposition, Tonette Bihari, that patient does not meed inpatient psych hospitalization at this time. CSW was informed that patient was sent to the hospital from [facility] and will need to return there. CSW attempted to contact Izora Ribas [phone]), but did not receive an answer and left a VM. CSW awaiting a call back.  Golden Circle, LCSW Transitions of Care Department [facility] ED [phone]

## 2019-07-12 NOTE — BH Assessment (Addendum)
[facility] Assessment Progress Note  Per Waylan Boga, DNP, this pt does not require psychiatric hospitalization at this time.  Pt presents under IVC initiated by pt's caregiver and initially upheld by EDP Lacretia Leigh, MD, but now rescinded by [facility].  Pt is to be discharged from [facility].  No behavioral health referrals are indicated for this pt at this time.  Golden Circle, CSW agrees to facilitate pt's return to his residential facility.  Pt's nurse, Nena Jordan, has been notified.  Jalene Mullet, [place] Triage Specialist [phone]

## 2019-07-12 NOTE — ED Provider Notes (Signed)
Evaluated the patient this morning, he escalated very quickly.  I try to initiate a calm conversation and he started becoming very agitated.  He began moving forward in the stretcher, gesticulating and having very tangential thoughts with pressured speech.  Trying to redirect verbally was not effective patient patient became more hostile and rambling in his speech.  Patient is alert.  No respiratory distress.  He does have some situational awareness.  He recognizes that he does not want to be in the emergency department and wants all of his belongings back.  He is preoccupied with 7 pairs of shoes that may be at risk apparently at the group home he came from.  All movements are coordinated and purposeful.  I explained to the patient that he would need a COVID test for any final disposition from the emergency department regardless of where he went.  He became more agitated and fixated on his ideas that he did not need any of these things done and should not have to come here.  Patient has continued to escalate during this conversation and is exhibiting physical agitation.  Will need to administer Geodon for safety of staff and treatment of decompensated schizophrenia with history of TBI.   Charlesetta Shanks, MD 07/12/19 [phone]

## 2019-07-12 NOTE — Discharge Instructions (Signed)
Please return for any problem.  °

## 2019-07-12 NOTE — ED Notes (Signed)
Pt refusing Covid swab , Dr Vallery Ridge has been made aware and came to assess and speak with pt. Pt is agitated and is yelling in room at provider regarding wanting to go back to SNF, pt is  defensive, guarded, paranoid. Per provider pt will need to be sedated to obtain necessary testing.

## 2019-07-12 NOTE — Consult Note (Addendum)
[facility] Psych ED Discharge  07/12/2019 2:30 PM [place].  MRN:  [id number] Principal Problem: Schizophrenia [facility]) Discharge Diagnoses: Principal Problem:   Schizophrenia ([place]) Active Problems:   TBI (traumatic brain injury) ([place])   Subjective: "I'm not any of those things," when asked about being suicidal/homicidal or hallucinations.  He would like to return to his group home.  Patient seen and evaluated in person by this provider.  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  He has a long history of substance abuse prior to his injury.  Fixed delusion that he is returning to his home in [place].  His disorganized thought process appears to be his baseline from brain injury along with poor impulse control, classic TBI sign.  No suicidal/homicidal ideations, hallucinations, or recent substance abuse.  Stable to return to his group home as he is at his baseline for a TBI patient.  Total Time spent with patient: 30 minutes  Past Psychiatric History: schizophrenia  Past Medical History:  Past Medical History:  Diagnosis Date  . Seizures ([place])   . TBI (traumatic brain injury) [facility])     Past Surgical History:  Procedure Laterality Date  . CRANIOPLASTY    . CRANIOTOMY     Family History: No family history on file. Family Psychiatric  History: none Social History:  Social History   Substance and Sexual Activity  Alcohol Use Yes     Social History   Substance and Sexual Activity  Drug Use Yes  . Types: Cocaine    Social History   Socioeconomic History  . Marital status: Single    Spouse name: Not on file  . Number of children: Not on file  . Years of education: Not on file  . Highest education level: Not on file  Occupational History  . Not on file  Social Needs  . Financial resource strain: Not on file  . Food insecurity    Worry: Not on file    Inability: Not on file  . Transportation needs    Medical: Not on file     Non-medical: Not on file  Tobacco Use  . Smoking status: Never Smoker  Substance and Sexual Activity  . Alcohol use: Yes  . Drug use: Yes    Types: Cocaine  . Sexual activity: Never  Lifestyle  . Physical activity    Days per week: Not on file    Minutes per session: Not on file  . Stress: Not on file  Relationships  . Social Herbalist on phone: Not on file    Gets together: Not on file    Attends religious service: Not on file    Active member of club or organization: Not on file    Attends meetings of clubs or organizations: Not on file    Relationship status: Not on file  Other Topics Concern  . Not on file  Social History Narrative   ** Merged History Encounter **        Has this patient used any form of tobacco in the last 30 days? (Cigarettes, Smokeless Tobacco, Cigars, and/or Pipes) Prescription not provided because: Patient does not use tobacco products.  Current Medications: Current Facility-Administered Medications  Medication Dose Route Frequency Provider Last Rate Last Dose  . acamprosate (CAMPRAL) tablet 666 mg  666 mg Oral TID WC Lacretia Leigh, MD   666 mg at 07/12/19 1250  . acetaminophen (TYLENOL) tablet 650  mg  650 mg Oral Q4H PRN Lacretia Leigh, MD      . atorvastatin (LIPITOR) tablet 40 mg  40 mg Oral QHS Lacretia Leigh, MD      . docusate sodium (COLACE) capsule 100 mg  100 mg Oral BID Lacretia Leigh, MD   100 mg at 07/12/19 0849  . ferrous sulfate tablet 325 mg  325 mg Oral BID WC Lacretia Leigh, MD   325 mg at 07/12/19 0751  . levETIRAcetam (KEPPRA) tablet 1,000 mg  1,000 mg Oral BID Lacretia Leigh, MD   1,000 mg at 07/12/19 0849  . metoprolol tartrate (LOPRESSOR) tablet 12.5 mg  12.5 mg Oral BID Lacretia Leigh, MD   12.5 mg at 07/12/19 0850  . multivitamin with minerals tablet 1 tablet  1 tablet Oral Daily Lacretia Leigh, MD   1 tablet at 07/12/19 0849  . phenytoin (DILANTIN) ER capsule 100 mg  100 mg Oral TID Lacretia Leigh, MD   100 mg at  07/12/19 0849  . QUEtiapine (SEROQUEL) tablet 100 mg  100 mg Oral BID Lacretia Leigh, MD   100 mg at 07/12/19 0850  . sertraline (ZOLOFT) tablet 100 mg  100 mg Oral Daily Lacretia Leigh, MD   100 mg at 07/12/19 0850  . ziprasidone (GEODON) injection 10 mg  10 mg Intramuscular Once Lacretia Leigh, MD       Current Outpatient Medications  Medication Sig Dispense Refill  . acamprosate (CAMPRAL) 333 MG tablet Take 666 mg by mouth 3 (three) times daily with meals.    Marland Kitchen acetaminophen (TYLENOL) 325 MG tablet Take 650 mg by mouth every 4 (four) hours as needed for mild pain.    Marland Kitchen atorvastatin (LIPITOR) 40 MG tablet Take 40 mg by mouth at bedtime.    . docusate sodium (COLACE) 100 MG capsule Take 100 mg by mouth 2 (two) times daily.    . ferrous sulfate (FEROSUL) 325 (65 FE) MG tablet Take 325 mg by mouth 2 (two) times daily with a meal.    . lacosamide (VIMPAT) 200 MG TABS tablet Take 1 tablet (200 mg total) by mouth 2 (two) times daily for 30 days. 60 tablet 0  . levETIRAcetam (KEPPRA) 750 MG tablet Take 2 tablets (1,500 mg total) by mouth 2 (two) times daily for 30 days. 120 tablet 0  . LORazepam (ATIVAN) 2 MG/ML concentrated solution Take by mouth See admin instructions. Mix 0.5 ml (1 MG) in residents drink twice daily as needed for anxiety    . LORazepam (ATIVAN) 2 MG/ML injection Inject 0.5 mg into the vein every 8 (eight) hours as needed.    . Multiple Vitamin (MULTIVITAMIN WITH MINERALS) TABS tablet Take 1 tablet by mouth daily. 30 tablet 0  . naltrexone (DEPADE) 50 MG tablet Take 50 mg by mouth 2 (two) times a day.    . phenytoin (DILANTIN) 100 MG ER capsule Take 100 mg by mouth 3 (three) times daily.    . polyethylene glycol (MIRALAX / GLYCOLAX) 17 g packet Take 17 g by mouth daily.    . QUEtiapine (SEROQUEL) 100 MG tablet Take 100 mg by mouth 2 (two) times daily.    Marland Kitchen senna-docusate (SENOKOT-S) 8.6-50 MG tablet Take 1 tablet by mouth at bedtime as needed for mild constipation.    . sertraline  (ZOLOFT) 100 MG tablet Take 100 mg by mouth daily.    Marland Kitchen topiramate (TOPAMAX) 200 MG tablet Take 1 tablet (200 mg total) by mouth 2 (two) times daily for 30 days. [place]  tablet 0  . atorvastatin (LIPITOR) 40 MG tablet Take 40 mg by mouth at bedtime.    . clotrimazole (LOTRIMIN) 1 % cream Apply to affected area 2 times daily (Patient not taking: Reported on 06/09/2018) 15 g 0  . clotrimazole-betamethasone (LOTRISONE) cream Apply 1 application topically 2 (two) times daily. (Patient not taking: Reported on 07/11/2019) 30 g 1  . phenytoin (DILANTIN) 100 MG ER capsule Take 1 capsule (100 mg total) by mouth 3 (three) times daily. (Patient not taking: Reported on 07/11/2019) 60 capsule 0  . polyethylene glycol (MIRALAX / GLYCOLAX) packet Take 17 g by mouth daily.    Marland Kitchen talc powder Apply topically as needed. (Patient not taking: Reported on 06/09/2018) 240 g 0   PTA Medications: (Not in a hospital admission)   Musculoskeletal: Strength & Muscle Tone: within normal limits Gait & Station: normal Patient leans: N/A  Psychiatric Specialty Exam: Physical Exam  Nursing note and vitals reviewed. Constitutional: He is oriented to person, place, and time. He appears well-developed and well-nourished.  HENT:  Head: Normocephalic.  Neck: Normal range of motion.  Respiratory: Effort normal.  Musculoskeletal: Normal range of motion.  Neurological: He is alert and oriented to person, place, and time.  Psychiatric: His speech is normal and behavior is normal. Thought content normal. His mood appears anxious. His affect is blunt. Cognition and memory are impaired. He expresses impulsivity.    Review of Systems  Psychiatric/Behavioral: The patient is nervous/anxious.   All other systems reviewed and are negative.   Blood pressure 113/79, pulse 85, temperature 98.3 F (36.8 C), temperature source Oral, resp. rate 18, SpO2 95 %.There is no height or weight on file to calculate BMI.  General Appearance: Casual  Eye  Contact:  Good  Speech:  Normal Rate  Volume:  Normal  Mood:  Anxious  Affect:  Blunt  Thought Process:  Disorganized at times  Orientation:  Full (Time, Place, and Person)  Thought Content:  Delusions  Suicidal Thoughts:  No  Homicidal Thoughts:  No  Memory:  Immediate;   Fair Recent;   Fair Remote;   Fair  Judgement:  Fair  Insight:  Fair  Psychomotor Activity:  Normal  Concentration:  Concentration: Fair and Attention Span: Fair  Recall:  [employer] of Knowledge:  Fair  Language:  Good  Akathisia:  No  Handed:  Right  AIMS (if indicated):     Assets:  Housing Leisure Time Physical Health Resilience Social Support  ADL's:  Intact  Cognition:  Impaired,  Mild  Sleep:       Demographic Factors:  Male  Loss Factors: NA  Historical Factors: Impulsivity  Risk Reduction Factors:   Sense of responsibility to family, Living with another person, especially a relative, Positive social support and Positive therapeutic relationship  Continued Clinical Symptoms:  Anxiety, mild  Cognitive Features That Contribute To Risk:  None    Suicide Risk:  Minimal: No identifiable suicidal ideation.  Patients presenting with no risk factors but with morbid ruminations; may be classified as minimal risk based on the severity of the depressive symptoms  Plan Of Care/Follow-up recommendations:  Schizophrenia: -Continued Seroquel 100 mg BID  Depression: -Continued Zoloft 100 mg daily  Alcohol dependence, remission: -Continued Campral 666 mg TID Activity:  as tolerated Diet:  heart healthy diet  Disposition: discharge to group home Waylan Boga, NP 07/12/2019, 2:30 PM   Patient's chart reviewed. Reviewed the information documented and agree with the treatment plan.  Buford Dresser, DO  07/12/19 5:48 PM

## 2019-07-12 NOTE — ED Notes (Signed)
Pt resting at this time, and is calm

## 2019-07-12 NOTE — ED Notes (Signed)
Pt is alert and oriented x 4 and is verbally responsive.

## 2019-07-12 NOTE — ED Notes (Signed)
Called [facility]
